# Patient Record
Sex: Male | Born: 1980 | Race: Black or African American | Hispanic: No | Marital: Single | Smoking: Never smoker
Health system: Southern US, Community
[De-identification: ages and names within clinical notes are randomized; demographics above are authoritative.]

## PROBLEM LIST (undated history)

## (undated) DIAGNOSIS — T7840XA Allergy, unspecified, initial encounter: Secondary | ICD-10-CM

## (undated) DIAGNOSIS — F1110 Opioid abuse, uncomplicated: Secondary | ICD-10-CM

## (undated) HISTORY — DX: Allergy, unspecified, initial encounter: T78.40XA

## (undated) HISTORY — PX: SPLENECTOMY: SUR1306

## (undated) HISTORY — PX: NO PAST SURGERIES: SHX2092

---

## 2003-02-20 ENCOUNTER — Ambulatory Visit (HOSPITAL_BASED_OUTPATIENT_CLINIC_OR_DEPARTMENT_OTHER): Admission: RE | Admit: 2003-02-20 | Discharge: 2003-02-20 | Payer: Self-pay | Admitting: Urology

## 2017-06-06 ENCOUNTER — Emergency Department
Admission: EM | Admit: 2017-06-06 | Discharge: 2017-06-06 | Disposition: A | Discharge status: Home / Self Care | Payer: Self-pay | Attending: Emergency Medicine | Admitting: Emergency Medicine

## 2017-06-06 DIAGNOSIS — Y999 Unspecified external cause status: Secondary | ICD-10-CM | POA: Insufficient documentation

## 2017-06-06 DIAGNOSIS — Y9289 Other specified places as the place of occurrence of the external cause: Secondary | ICD-10-CM | POA: Insufficient documentation

## 2017-06-06 DIAGNOSIS — W57XXXA Bitten or stung by nonvenomous insect and other nonvenomous arthropods, initial encounter: Secondary | ICD-10-CM | POA: Insufficient documentation

## 2017-06-06 DIAGNOSIS — Y9389 Activity, other specified: Secondary | ICD-10-CM | POA: Insufficient documentation

## 2017-06-06 DIAGNOSIS — T148XXA Other injury of unspecified body region, initial encounter: Secondary | ICD-10-CM | POA: Insufficient documentation

## 2017-06-06 DIAGNOSIS — R42 Dizziness and giddiness: Secondary | ICD-10-CM | POA: Insufficient documentation

## 2017-06-06 LAB — COMPREHENSIVE METABOLIC PANEL
ALBUMIN: 4.3 g/dL (ref 3.5–5.0)
ALT: 14 U/L — ABNORMAL LOW (ref 17–63)
ANION GAP: 9 (ref 5–15)
AST: 27 U/L (ref 15–41)
Alkaline Phosphatase: 84 U/L (ref 38–126)
BILIRUBIN TOTAL: 0.4 mg/dL (ref 0.3–1.2)
BUN: 14 mg/dL (ref 6–20)
CHLORIDE: 106 mmol/L (ref 101–111)
CO2: 27 mmol/L (ref 22–32)
Calcium: 9.1 mg/dL (ref 8.9–10.3)
Creatinine, Ser: 1.37 mg/dL — ABNORMAL HIGH (ref 0.61–1.24)
Glucose, Bld: 133 mg/dL — ABNORMAL HIGH (ref 65–99)
POTASSIUM: 3.6 mmol/L (ref 3.5–5.1)
SODIUM: 142 mmol/L (ref 135–145)
TOTAL PROTEIN: 7.7 g/dL (ref 6.5–8.1)

## 2017-06-06 LAB — CBC WITH DIFFERENTIAL/PLATELET
BASOS ABS: 0.1 10*3/uL (ref 0–0.1)
BASOS PCT: 1 %
EOS ABS: 0.2 10*3/uL (ref 0–0.7)
Eosinophils Relative: 1 %
HEMATOCRIT: 43.7 % (ref 40.0–52.0)
HEMOGLOBIN: 14.7 g/dL (ref 13.0–18.0)
Lymphocytes Relative: 28 %
Lymphs Abs: 3.7 10*3/uL — ABNORMAL HIGH (ref 1.0–3.6)
MCH: 31.1 pg (ref 26.0–34.0)
MCHC: 33.7 g/dL (ref 32.0–36.0)
MCV: 92.5 fL (ref 80.0–100.0)
Monocytes Absolute: 1 10*3/uL (ref 0.2–1.0)
Monocytes Relative: 8 %
NEUTROS ABS: 8.3 10*3/uL — AB (ref 1.4–6.5)
NEUTROS PCT: 62 %
Platelets: 243 10*3/uL (ref 150–440)
RBC: 4.73 MIL/uL (ref 4.40–5.90)
RDW: 13.1 % (ref 11.5–14.5)
WBC: 13.3 10*3/uL — AB (ref 3.8–10.6)

## 2017-06-06 LAB — LACTIC ACID, PLASMA
LACTIC ACID, VENOUS: 3.3 mmol/L — AB (ref 0.5–1.9)
LACTIC ACID, VENOUS: 1.2 mmol/L (ref 0.5–1.9)

## 2017-06-06 MED ORDER — SODIUM CHLORIDE 0.9 % IV BOLUS (SEPSIS)
1000.0000 mL | Freq: Once | INTRAVENOUS | Status: AC
  Administered 2017-06-06: 1000 mL via INTRAVENOUS

## 2017-06-06 MED ORDER — BACITRACIN ZINC 500 UNIT/GM EX OINT
TOPICAL_OINTMENT | CUTANEOUS | Status: AC
  Filled 2017-06-06: qty 0.9

## 2017-06-06 MED ORDER — BACITRACIN ZINC 500 UNIT/GM EX OINT
TOPICAL_OINTMENT | Freq: Two times a day (BID) | CUTANEOUS | Status: DC
  Administered 2017-06-06: 1 via TOPICAL

## 2017-06-06 MED ORDER — CEPHALEXIN 500 MG PO CAPS: 500.0000 mg | ORAL_CAPSULE | Freq: Two times a day (BID) | ORAL | 0 refills | Status: DC

## 2017-06-06 MED ORDER — BACITRACIN ZINC 500 UNIT/GM EX OINT: TOPICAL_OINTMENT | CUTANEOUS | 0 refills | Status: AC

## 2017-06-06 NOTE — ED Provider Notes (Signed)
[facility] Emergency Department Provider Note  Time seen: 6:19 PM  I have reviewed the triage vital signs and the nursing notes.   HISTORY  Chief Complaint Insect Bite    HPI [name] is a 36 y.o. male with no significant past medical history who presents to the emergency department after a spider bite. According to the patient he was driving down the road when he felt something bite him in his perineum. Patient states he is extremely afraid of spiders and began ferociously scraping the area that the bite was in. Patient states while doing that he did feel a pop of a spider body and did see several spider body parts. Patient states he was having bleeding from this area so he came to the emergency department for evaluation. Patient states initially he was very lightheaded and dizzy after the bite. Admits anxiety about the spider bite. Upon arrival was quite gaping as well as tachycardic. Patient states he is calm down quite a bit now and is feeling much better.  History reviewed. No pertinent past medical history.  There are no active problems to display for this patient.   History reviewed. No pertinent surgical history.  Prior to Admission medications   Not on File    Not on File  History reviewed. No pertinent family history.  Social History Social History  Substance Use Topics  . Smoking status: Never Smoker  . Smokeless tobacco: Never Used  . Alcohol use No    Review of Systems Constitutional: Negative for fever. Positive for lightheadedness now resolved Cardiovascular: Negative for chest pain. Respiratory: Negative for shortness of breath. Gastrointestinal: Negative for abdominal pain. Negative for nausea or vomiting. Negative for diarrhea Musculoskeletal: Negative for back pain. Neurological: Negative for headache All other ROS negative  ____________________________________________   PHYSICAL EXAM:  VITAL SIGNS: ED Triage Vitals   Enc Vitals Group     BP 06/06/17 1730 (!) 150/92     Pulse Rate 06/06/17 1740 (!) 118     Resp --      Temp 06/06/17 1728 99.1 F (37.3 C)     Temp Source 06/06/17 1728 Oral     SpO2 --      Weight 06/06/17 1728 200 lb (90.7 kg)     Height 06/06/17 1728 6\' 1"  (1.854 m)     Head Circumference --      Peak Flow --      Pain Score 06/06/17 1728 10     Pain Loc --      Pain Edu? --      Excl. in GC? --     Constitutional: Alert and oriented. Well appearing and in no distress. Eyes: Normal exam ENT   Head: Normocephalic and atraumatic.   Mouth/Throat: Mucous membranes are moist. Cardiovascular: Regular rate and rhythm in room 100 bpm. No murmur. Respiratory: Normal respiratory effort without tachypnea nor retractions. Breath sounds are clear  Gastrointestinal: Soft and nontender. No distention.  Patient's perineum is quite excoriated there is minimal oozing of blood from one of the scrapes, no lacerations. Musculoskeletal: Nontender with normal range of motion in all extremities.  Neurologic:  Normal speech and language. No gross focal neurologic deficits Skin:  Skin is warm, dry and intact.  Psychiatric: Mood and affect are normal.   ____________________________________________   INITIAL IMPRESSION / ASSESSMENT AND PLAN / ED COURSE  Pertinent labs & imaging results that were available during my care of the patient were reviewed by me and considered  in my medical decision making (see chart for details).  Patient presents to the emergency department after being bit by a spider. Asians pernio Ms. very excoriated consistent with scratching as the patient states he did. There is no laceration but there are several scrapes/abrasions to the area. There is no obvious area of bite. Patient admits significant anxiety at the time I see severely dislikes spiders, but states he is now calm down. His labs have resulted with a significant lactic acidosis of 3.3. It is not entirely clear  with a lactic acidosis is originating. No clinical concerns for sepsis as the patient was just bitten by the spider prior to arrival. We will IV hydrate and recheck labs. We will apply bacitracin to the area.  Patient care signed out to Dr. Derrill KayGoodman.  ____________________________________________   FINAL CLINICAL IMPRESSION(S) / ED DIAGNOSES  Spider bite Excoriation    Minna AntisPaduchowski, Jakaleb Payer, MD 06/06/17 2020

## 2017-06-06 NOTE — ED Triage Notes (Signed)
Per EMS pt was driving and felt a bite between his legs.  He reached down and began to scratch.  Pt states he pulled over and call 911 because he was sweating and felt dizzy and shaking.  Pt is A&Ox4.  Pt denies dizziness upon arrival.  Pt is bleeding from rectal area due to scratching.

## 2017-06-06 NOTE — ED Notes (Signed)
Pt will not leave blood pressure equipment or monitoring equipment in place. Multiple explanations provided to pt regarding need for monitoring equipment placement.

## 2017-06-06 NOTE — Discharge Instructions (Signed)
Please apply bacitracin to the affected area twice daily for the next 7 days. Please take her antibiotic as prescribed twice daily for the next 5 days. Please take Tylenol or ibuprofen as needed for discomfort as written on the box. Return to the emergency department for any significant increase in discomfort, fever, or any other symptom personally concerning to yourself.

## 2017-06-06 NOTE — ED Notes (Signed)
Pt updated on treatment plan and need to redraw lab work at 2030. Pt verbalizes understanding.

## 2017-06-11 LAB — CULTURE, BLOOD (ROUTINE X 2)
CULTURE: NO GROWTH
Culture: NO GROWTH
SPECIAL REQUESTS: ADEQUATE
SPECIAL REQUESTS: ADEQUATE

## 2019-02-04 ENCOUNTER — Other Ambulatory Visit: Payer: Self-pay

## 2019-02-04 ENCOUNTER — Inpatient Hospital Stay
Admission: EM | Admit: 2019-02-04 | Discharge: 2019-02-07 | DRG: 917 | Disposition: A | Discharge status: Home / Self Care | Payer: Self-pay | Attending: Family Medicine | Admitting: Family Medicine

## 2019-02-04 ENCOUNTER — Emergency Department: Payer: Self-pay

## 2019-02-04 DIAGNOSIS — Z9189 Other specified personal risk factors, not elsewhere classified: Secondary | ICD-10-CM | POA: Diagnosis present

## 2019-02-04 DIAGNOSIS — N170 Acute kidney failure with tubular necrosis: Secondary | ICD-10-CM | POA: Diagnosis present

## 2019-02-04 DIAGNOSIS — G92 Toxic encephalopathy: Secondary | ICD-10-CM | POA: Diagnosis present

## 2019-02-04 DIAGNOSIS — T401X1A Poisoning by heroin, accidental (unintentional), initial encounter: Principal | ICD-10-CM | POA: Diagnosis present

## 2019-02-04 DIAGNOSIS — Z7982 Long term (current) use of aspirin: Secondary | ICD-10-CM

## 2019-02-04 DIAGNOSIS — R0602 Shortness of breath: Secondary | ICD-10-CM

## 2019-02-04 DIAGNOSIS — T50901A Poisoning by unspecified drugs, medicaments and biological substances, accidental (unintentional), initial encounter: Secondary | ICD-10-CM

## 2019-02-04 DIAGNOSIS — F4323 Adjustment disorder with mixed anxiety and depressed mood: Secondary | ICD-10-CM | POA: Diagnosis present

## 2019-02-04 DIAGNOSIS — F19239 Other psychoactive substance dependence with withdrawal, unspecified: Secondary | ICD-10-CM | POA: Diagnosis present

## 2019-02-04 DIAGNOSIS — R0902 Hypoxemia: Secondary | ICD-10-CM

## 2019-02-04 DIAGNOSIS — Z1159 Encounter for screening for other viral diseases: Secondary | ICD-10-CM

## 2019-02-04 DIAGNOSIS — J9601 Acute respiratory failure with hypoxia: Secondary | ICD-10-CM | POA: Diagnosis present

## 2019-02-04 DIAGNOSIS — J9602 Acute respiratory failure with hypercapnia: Secondary | ICD-10-CM | POA: Diagnosis present

## 2019-02-04 DIAGNOSIS — J690 Pneumonitis due to inhalation of food and vomit: Secondary | ICD-10-CM | POA: Diagnosis present

## 2019-02-04 DIAGNOSIS — E876 Hypokalemia: Secondary | ICD-10-CM | POA: Diagnosis present

## 2019-02-04 DIAGNOSIS — J9691 Respiratory failure, unspecified with hypoxia: Secondary | ICD-10-CM | POA: Diagnosis present

## 2019-02-04 DIAGNOSIS — Z7902 Long term (current) use of antithrombotics/antiplatelets: Secondary | ICD-10-CM

## 2019-02-04 LAB — CBC WITH DIFFERENTIAL/PLATELET
ABS IMMATURE GRANULOCYTES: 0.09 10*3/uL — AB (ref 0.00–0.07)
BASOS PCT: 0 %
Basophils Absolute: 0.1 10*3/uL (ref 0.0–0.1)
EOS ABS: 0.1 10*3/uL (ref 0.0–0.5)
Eosinophils Relative: 1 %
HCT: 49.6 % (ref 39.0–52.0)
Hemoglobin: 15.9 g/dL (ref 13.0–17.0)
Immature Granulocytes: 1 %
Lymphocytes Relative: 17 %
Lymphs Abs: 3.3 10*3/uL (ref 0.7–4.0)
MCH: 30 pg (ref 26.0–34.0)
MCHC: 32.1 g/dL (ref 30.0–36.0)
MCV: 93.6 fL (ref 80.0–100.0)
MONO ABS: 0.6 10*3/uL (ref 0.1–1.0)
Monocytes Relative: 3 %
NEUTROS ABS: 15.1 10*3/uL — AB (ref 1.7–7.7)
Neutrophils Relative %: 78 %
PLATELETS: 283 10*3/uL (ref 150–400)
RBC: 5.3 MIL/uL (ref 4.22–5.81)
RDW: 13 % (ref 11.5–15.5)
WBC: 19.2 10*3/uL — AB (ref 4.0–10.5)
nRBC: 0 % (ref 0.0–0.2)

## 2019-02-04 LAB — COMPREHENSIVE METABOLIC PANEL
ALT: 14 U/L (ref 0–44)
ANION GAP: 13 (ref 5–15)
AST: 24 U/L (ref 15–41)
Albumin: 4.1 g/dL (ref 3.5–5.0)
Alkaline Phosphatase: 75 U/L (ref 38–126)
BUN: 7 mg/dL (ref 6–20)
CHLORIDE: 100 mmol/L (ref 98–111)
CO2: 22 mmol/L (ref 22–32)
Calcium: 8.2 mg/dL — ABNORMAL LOW (ref 8.9–10.3)
Creatinine, Ser: 1.06 mg/dL (ref 0.61–1.24)
GFR calc Af Amer: >60 mL/min (ref >60)
GFR calc non Af Amer: >60 mL/min (ref >60)
Glucose, Bld: 237 mg/dL — ABNORMAL HIGH (ref 70–99)
POTASSIUM: 3.2 mmol/L — AB (ref 3.5–5.1)
SODIUM: 135 mmol/L (ref 135–145)
Total Bilirubin: 0.6 mg/dL (ref 0.3–1.2)
Total Protein: 7.1 g/dL (ref 6.5–8.1)

## 2019-02-04 LAB — TROPONIN I

## 2019-02-04 MED ORDER — SODIUM CHLORIDE 0.9 % IV SOLN: Freq: Once | INTRAVENOUS | Status: AC

## 2019-02-04 MED ORDER — LORAZEPAM 2 MG/ML IJ SOLN
1.0000 mg | Freq: Once | INTRAMUSCULAR | Status: AC
  Administered 2019-02-04: 1 mg via INTRAVENOUS
  Filled 2019-02-04: qty 1

## 2019-02-04 MED ORDER — IPRATROPIUM-ALBUTEROL 0.5-2.5 (3) MG/3ML IN SOLN
3.0000 mL | Freq: Once | RESPIRATORY_TRACT | Status: AC
  Administered 2019-02-04: 3 mL via RESPIRATORY_TRACT
  Filled 2019-02-04: qty 3

## 2019-02-04 MED ORDER — PIPERACILLIN-TAZOBACTAM 3.375 G IVPB 30 MIN
3.3750 g | Freq: Once | INTRAVENOUS | Status: AC
  Administered 2019-02-04: 3.375 g via INTRAVENOUS
  Filled 2019-02-04: qty 50

## 2019-02-04 NOTE — ED Provider Notes (Signed)
[facility] Emergency Department Provider Note       Time seen: ----------------------------------------- 10:35 PM on 02/04/2019 -----------------------------------------  Level V caveat: History/ROS limited by altered mental status I have reviewed the triage vital signs and the nursing notes.  HISTORY   Chief Complaint Drug Overdose    HPI [name] is a 38 y.o. male with a history of no known past medical history who was brought to the ER after overdose.  Patient had just finished rehab for narcotics about a week ago, family called EMS because he was unresponsive.  When EMS arrived they initially thought he was a cardiac arrest.  IO access was obtained and he was given 4 mg of Narcan intranasally with return of mental status.  Patient was noted to be hypoxic in route and was placed on a nonrebreather.  Patient states he does not remember what happened.  No past medical history on file.  There are no active problems to display for this patient.   No past surgical history on file.  Allergies Patient has no allergy information on record.  Social History Social History   Tobacco Use  . Smoking status: Never Smoker  . Smokeless tobacco: Never Used  Substance Use Topics  . Alcohol use: No  . Drug use: No   Review of Systems Constitutional: Negative for fever. Cardiovascular: Negative for chest pain. Respiratory: Positive for shortness of breath Gastrointestinal: Negative for abdominal pain, vomiting and diarrhea. Musculoskeletal: Negative for back pain. Skin: Negative for rash. Neurological: Negative for headaches, focal weakness or numbness.  All systems negative/normal/unremarkable except as stated in the HPI  ____________________________________________   PHYSICAL EXAM:  VITAL SIGNS: ED Triage Vitals  Enc Vitals Group     BP      Pulse      Resp      Temp      Temp src      SpO2      Weight      Height      Head  Circumference      Peak Flow      Pain Score      Pain Loc      Pain Edu?      Excl. in GC?    Constitutional: Alert but disoriented, mild distress Eyes: Conjunctivae are normal. Normal extraocular movements. ENT      Head: Normocephalic and atraumatic.      Nose: No congestion/rhinnorhea.      Mouth/Throat: Mucous membranes are moist.      Neck: No stridor. Cardiovascular: Normal rate, regular rhythm. No murmurs, rubs, or gallops. Respiratory: Tachypnea with rales bilaterally Gastrointestinal: Soft and nontender. Normal bowel sounds Musculoskeletal: Nontender with normal range of motion in extremities.  IO access in the left tibia Neurologic:  Normal speech and language. No gross focal neurologic deficits are appreciated.  Skin:  Skin is warm, dry and intact. No rash noted. Psychiatric: There is some confusion, affect appears to be normal ____________________________________________  EKG: Interpreted by me.  Sinus tachycardia with a rate of 150 bpm, normal PR interval, normal QRS, normal QT  ____________________________________________  ED COURSE:  As part of my medical decision making, I reviewed the following data within the electronic MEDICAL RECORD NUMBER History obtained from family if available, nursing notes, old chart and ekg, as well as notes from prior ED visits. Patient presented for an overdose with possible aspiration, we will assess with labs and imaging as indicated at this time.   Procedures  [name] was evaluated in Emergency Department on 02/04/2019 for the symptoms described in the history of present illness. He was evaluated in the context of the global COVID-19 pandemic, which necessitated consideration that the patient might be at risk for infection with the SARS-CoV-2 virus that causes COVID-19. Institutional protocols and algorithms that pertain to the evaluation of patients at risk for COVID-19 are in a state of rapid change based on information released by  regulatory bodies including the CDC and federal and state organizations. These policies and algorithms were followed during the patient's care in the ED.  ____________________________________________   LABS (pertinent positives/negatives)  Labs Reviewed  CBC WITH DIFFERENTIAL/PLATELET - Abnormal; Notable for the following components:      Result Value   WBC 19.2 (*)    Neutro Abs 15.1 (*)    Abs Immature Granulocytes 0.09 (*)    All other components within normal limits  COMPREHENSIVE METABOLIC PANEL - Abnormal; Notable for the following components:   Potassium 3.2 (*)    Glucose, Bld 237 (*)    Calcium 8.2 (*)    All other components within normal limits  TROPONIN I  URINE DRUG SCREEN, QUALITATIVE ([facility] ONLY)  BLOOD GAS, VENOUS  ETHANOL  ACETAMINOPHEN LEVEL  SALICYLATE LEVEL   CRITICAL CARE Performed by: Ulice Dash   Total critical care time: 30 minutes  Critical care time was exclusive of separately billable procedures and treating other patients.  Critical care was necessary to treat or prevent imminent or life-threatening deterioration.  Critical care was time spent personally by me on the following activities: development of treatment plan with patient and/or surrogate as well as nursing, discussions with consultants, evaluation of patient's response to treatment, examination of patient, obtaining history from patient or surrogate, ordering and performing treatments and interventions, ordering and review of laboratory studies, ordering and review of radiographic studies, pulse oximetry and re-evaluation of patient's condition.  RADIOLOGY Images were viewed by me  chest x-ray Revealed bibasilar infiltrates ____________________________________________   DIFFERENTIAL DIAGNOSIS   Overdose, aspiration pneumonia, substance abuse, intoxication  FINAL ASSESSMENT AND PLAN  Overdose, hypoxemia, likely aspiration pneumonia   Plan: The patient had presented for  accidental drug overdose. Patient's labs are still pending at this time. Patient's imaging revealed right lower lobe infiltrates, possible aspiration pneumonia.  Patient appears to be improving on BiPAP considerably, oxygen saturations are improving and heart rate is decreasing.  He was given IV Zosyn to cover for aspiration pneumonia.  I will discuss with the hospitalist for admission.   Ulice Dash, MD    Note: This note was generated in part or whole with voice recognition software. Voice recognition is usually quite accurate but there are transcription errors that can and very often do occur. I apologize for any typographical errors that were not detected and corrected.     Emily Filbert, MD 02/04/19 [phone]

## 2019-02-04 NOTE — ED Triage Notes (Signed)
Pt to ED via EMS from home with overdose on fentanyl, pt states he does not know maount and he injected it. Per ems pt was initially unresponsive with no pulses. Pt was given 4mg  narcan intranasally, pt had pinpoint pupils and agonal respirations. Placed on non rebreather 15l. Pt arrives alert denies pain. 85% room air.

## 2019-02-04 NOTE — ED Notes (Signed)
Redrawn red top, Ann RN aware

## 2019-02-05 DIAGNOSIS — T50901A Poisoning by unspecified drugs, medicaments and biological substances, accidental (unintentional), initial encounter: Secondary | ICD-10-CM | POA: Diagnosis present

## 2019-02-05 DIAGNOSIS — J690 Pneumonitis due to inhalation of food and vomit: Secondary | ICD-10-CM | POA: Diagnosis present

## 2019-02-05 DIAGNOSIS — Z9189 Other specified personal risk factors, not elsewhere classified: Secondary | ICD-10-CM | POA: Diagnosis present

## 2019-02-05 DIAGNOSIS — J9601 Acute respiratory failure with hypoxia: Secondary | ICD-10-CM

## 2019-02-05 DIAGNOSIS — J9691 Respiratory failure, unspecified with hypoxia: Secondary | ICD-10-CM | POA: Diagnosis present

## 2019-02-05 LAB — PHOSPHORUS
Phosphorus: 1 mg/dL — CL (ref 2.5–4.6)
Phosphorus: 2.1 mg/dL — ABNORMAL LOW (ref 2.5–4.6)

## 2019-02-05 LAB — SALICYLATE LEVEL: Salicylate Lvl: <7 mg/dL (ref 2.8–30.0)

## 2019-02-05 LAB — RESPIRATORY PANEL BY PCR

## 2019-02-05 LAB — BASIC METABOLIC PANEL
Anion gap: 9 (ref 5–15)
Anion gap: 8 (ref 5–15)
BUN: 6 mg/dL (ref 6–20)
BUN: 8 mg/dL (ref 6–20)
CO2: 24 mmol/L (ref 22–32)
CO2: 23 mmol/L (ref 22–32)
Calcium: 8.6 mg/dL — ABNORMAL LOW (ref 8.9–10.3)
Calcium: 8 mg/dL — ABNORMAL LOW (ref 8.9–10.3)
Chloride: 103 mmol/L (ref 98–111)
Chloride: 105 mmol/L (ref 98–111)
Creatinine, Ser: 0.95 mg/dL (ref 0.61–1.24)
Creatinine, Ser: 0.97 mg/dL (ref 0.61–1.24)
GFR calc Af Amer: >60 mL/min (ref >60)
GFR calc Af Amer: >60 mL/min (ref >60)
GFR calc non Af Amer: >60 mL/min (ref >60)
GFR calc non Af Amer: >60 mL/min (ref >60)
GLUCOSE: 116 mg/dL — AB (ref 70–99)
Glucose, Bld: 173 mg/dL — ABNORMAL HIGH (ref 70–99)
POTASSIUM: 4.3 mmol/L (ref 3.5–5.1)
Potassium: 5.1 mmol/L (ref 3.5–5.1)
Sodium: 136 mmol/L (ref 135–145)
Sodium: 136 mmol/L (ref 135–145)

## 2019-02-05 LAB — CBC
HCT: 48.7 % (ref 39.0–52.0)
Hemoglobin: 15.9 g/dL (ref 13.0–17.0)
MCH: 30.2 pg (ref 26.0–34.0)
MCHC: 32.6 g/dL (ref 30.0–36.0)
MCV: 92.4 fL (ref 80.0–100.0)
Platelets: 283 10*3/uL (ref 150–400)
RBC: 5.27 MIL/uL (ref 4.22–5.81)
RDW: 13 % (ref 11.5–15.5)
WBC: 17.3 10*3/uL — ABNORMAL HIGH (ref 4.0–10.5)
nRBC: 0 % (ref 0.0–0.2)

## 2019-02-05 LAB — INFLUENZA PANEL BY PCR (TYPE A & B)
INFLAPCR: NEGATIVE
Influenza B By PCR: NEGATIVE

## 2019-02-05 LAB — URINE DRUG SCREEN, QUALITATIVE (ARMC ONLY)
Amphetamines, Ur Screen: NOT DETECTED
BARBITURATES, UR SCREEN: NOT DETECTED
Benzodiazepine, Ur Scrn: NOT DETECTED
COCAINE METABOLITE, UR ~~LOC~~: NOT DETECTED
Cannabinoid 50 Ng, Ur ~~LOC~~: NOT DETECTED
MDMA (Ecstasy)Ur Screen: NOT DETECTED
METHADONE SCREEN, URINE: NOT DETECTED
OPIATE, UR SCREEN: POSITIVE — AB
PHENCYCLIDINE (PCP) UR S: NOT DETECTED
Tricyclic, Ur Screen: NOT DETECTED

## 2019-02-05 LAB — GLUCOSE, CAPILLARY: GLUCOSE-CAPILLARY: 120 mg/dL — AB (ref 70–99)

## 2019-02-05 LAB — MRSA PCR SCREENING: MRSA by PCR: NEGATIVE

## 2019-02-05 LAB — BLOOD GAS, VENOUS
Acid-Base Excess: 4.3 mmol/L — ABNORMAL HIGH (ref 0.0–2.0)
Bicarbonate: 33.4 mmol/L — ABNORMAL HIGH (ref 20.0–28.0)
O2 Saturation: 74.3 %
PCO2 VEN: 71 mmHg — AB (ref 44.0–60.0)
PH VEN: 7.28 (ref 7.250–7.430)
Patient temperature: 37
pO2, Ven: 45 mmHg (ref 32.0–45.0)

## 2019-02-05 LAB — HEMOGLOBIN A1C
Hgb A1c MFr Bld: 5.9 % — ABNORMAL HIGH (ref 4.8–5.6)
Mean Plasma Glucose: 122.63 mg/dL

## 2019-02-05 LAB — TROPONIN I

## 2019-02-05 LAB — ACETAMINOPHEN LEVEL

## 2019-02-05 LAB — MAGNESIUM: Magnesium: 1.5 mg/dL — ABNORMAL LOW (ref 1.7–2.4)

## 2019-02-05 LAB — ETHANOL: Alcohol, Ethyl (B): <10 mg/dL (ref <10)

## 2019-02-05 MED ORDER — POTASSIUM CHLORIDE 20 MEQ PO PACK
60.0000 meq | PACK | Freq: Once | ORAL | Status: AC
  Administered 2019-02-05: 60 meq via ORAL
  Filled 2019-02-05: qty 3

## 2019-02-05 MED ORDER — METOPROLOL TARTRATE 5 MG/5ML IV SOLN
5.0000 mg | INTRAVENOUS | Status: AC
  Administered 2019-02-05: 5 mg via INTRAVENOUS

## 2019-02-05 MED ORDER — IPRATROPIUM-ALBUTEROL 20-100 MCG/ACT IN AERS
1.0000 | INHALATION_SPRAY | Freq: Four times a day (QID) | RESPIRATORY_TRACT | Status: DC
  Administered 2019-02-05: 1 via RESPIRATORY_TRACT
  Filled 2019-02-05: qty 4

## 2019-02-05 MED ORDER — ACETAMINOPHEN 325 MG PO TABS
650.0000 mg | ORAL_TABLET | ORAL | Status: DC | PRN
  Administered 2019-02-05 (×2): 650 mg via ORAL
  Filled 2019-02-05 (×2): qty 2

## 2019-02-05 MED ORDER — LABETALOL HCL 5 MG/ML IV SOLN
10.0000 mg | INTRAVENOUS | Status: AC
  Administered 2019-02-05: 10 mg via INTRAVENOUS
  Filled 2019-02-05: qty 4

## 2019-02-05 MED ORDER — DEXTROSE-NACL 5-0.9 % IV SOLN
INTRAVENOUS | Status: DC
  Administered 2019-02-05: 02:00:00 via INTRAVENOUS

## 2019-02-05 MED ORDER — PIPERACILLIN-TAZOBACTAM 3.375 G IVPB: 3.3750 g | Freq: Three times a day (TID) | INTRAVENOUS | Status: DC

## 2019-02-05 MED ORDER — SODIUM PHOSPHATES 45 MMOLE/15ML IV SOLN
20.0000 mmol | Freq: Once | INTRAVENOUS | Status: AC
  Administered 2019-02-06: 20 mmol via INTRAVENOUS
  Filled 2019-02-05: qty 6.67

## 2019-02-05 MED ORDER — ENOXAPARIN SODIUM 40 MG/0.4ML ~~LOC~~ SOLN
40.0000 mg | SUBCUTANEOUS | Status: DC
  Administered 2019-02-05: 40 mg via SUBCUTANEOUS
  Filled 2019-02-05: qty 0.4

## 2019-02-05 MED ORDER — SODIUM CHLORIDE 0.9 % IV SOLN
3.0000 g | Freq: Four times a day (QID) | INTRAVENOUS | Status: DC
  Administered 2019-02-05 – 2019-02-07 (×6): 3 g via INTRAVENOUS
  Filled 2019-02-05 (×9): qty 3

## 2019-02-05 MED ORDER — MAGNESIUM SULFATE 4 GM/100ML IV SOLN
4.0000 g | Freq: Once | INTRAVENOUS | Status: AC
  Administered 2019-02-05: 4 g via INTRAVENOUS
  Filled 2019-02-05: qty 100

## 2019-02-05 MED ORDER — ALBUTEROL SULFATE HFA 108 (90 BASE) MCG/ACT IN AERS
1.0000 | INHALATION_SPRAY | RESPIRATORY_TRACT | Status: DC | PRN
  Filled 2019-02-05: qty 6.7

## 2019-02-05 MED ORDER — SODIUM CHLORIDE 0.9 % IV BOLUS
1000.0000 mL | Freq: Once | INTRAVENOUS | Status: AC
  Administered 2019-02-05: 1000 mL via INTRAVENOUS

## 2019-02-05 MED ORDER — K PHOS MONO-SOD PHOS DI & MONO 155-852-130 MG PO TABS
500.0000 mg | ORAL_TABLET | ORAL | Status: AC
  Administered 2019-02-05 (×2): 500 mg via ORAL
  Filled 2019-02-05 (×3): qty 2

## 2019-02-05 MED ORDER — METOPROLOL TARTRATE 5 MG/5ML IV SOLN
INTRAVENOUS | Status: AC
  Administered 2019-02-05: 5 mg via INTRAVENOUS
  Filled 2019-02-05: qty 5

## 2019-02-05 MED ORDER — QUETIAPINE FUMARATE 200 MG PO TABS
300.0000 mg | ORAL_TABLET | Freq: Every day | ORAL | Status: DC
  Administered 2019-02-05: 300 mg via ORAL
  Filled 2019-02-05: qty 1

## 2019-02-05 MED ORDER — PIPERACILLIN-TAZOBACTAM 3.375 G IVPB
3.3750 g | Freq: Three times a day (TID) | INTRAVENOUS | Status: DC
  Administered 2019-02-05: 3.375 g via INTRAVENOUS
  Filled 2019-02-05: qty 50

## 2019-02-05 MED ORDER — IPRATROPIUM-ALBUTEROL 0.5-2.5 (3) MG/3ML IN SOLN: 3.0000 mL | Freq: Four times a day (QID) | RESPIRATORY_TRACT | Status: DC

## 2019-02-05 MED ORDER — IPRATROPIUM-ALBUTEROL 20-100 MCG/ACT IN AERS
1.0000 | INHALATION_SPRAY | Freq: Four times a day (QID) | RESPIRATORY_TRACT | Status: DC | PRN
  Filled 2019-02-05: qty 4

## 2019-02-05 MED ORDER — IPRATROPIUM-ALBUTEROL 0.5-2.5 (3) MG/3ML IN SOLN
RESPIRATORY_TRACT | Status: AC
  Administered 2019-02-05: 3 mL
  Filled 2019-02-05: qty 3

## 2019-02-05 MED ORDER — SODIUM CHLORIDE 0.9 % IV SOLN
INTRAVENOUS | Status: DC | PRN
  Administered 2019-02-05: 250 mL via INTRAVENOUS

## 2019-02-05 MED ORDER — ONDANSETRON HCL 4 MG/2ML IJ SOLN: 4.0000 mg | Freq: Four times a day (QID) | INTRAMUSCULAR | Status: DC | PRN

## 2019-02-05 MED ORDER — ALBUTEROL SULFATE (2.5 MG/3ML) 0.083% IN NEBU: 2.5000 mg | INHALATION_SOLUTION | RESPIRATORY_TRACT | Status: DC | PRN

## 2019-02-05 MED ORDER — ENOXAPARIN SODIUM 40 MG/0.4ML ~~LOC~~ SOLN
40.0000 mg | SUBCUTANEOUS | Status: DC
  Administered 2019-02-06 – 2019-02-07 (×2): 40 mg via SUBCUTANEOUS
  Filled 2019-02-05 (×2): qty 0.4

## 2019-02-05 MED ORDER — SODIUM PHOSPHATES 45 MMOLE/15ML IV SOLN
30.0000 mmol | Freq: Once | INTRAVENOUS | Status: AC
  Administered 2019-02-05: 30 mmol via INTRAVENOUS
  Filled 2019-02-05: qty 10

## 2019-02-05 NOTE — ED Notes (Signed)
Redrawn red top, Ann RN aware 

## 2019-02-05 NOTE — Progress Notes (Signed)
Patient transported to ICU room on Bipap with no complications. Report given to ICU RT.

## 2019-02-05 NOTE — Progress Notes (Signed)
Pharmacy Electrolyte Monitoring Consult:  Pharmacy consulted to assist in monitoring and replacing electrolytes in this 38 y.o. male admitted on 02/04/2019 with Drug Overdose   Labs:  Sodium (mmol/L)  Date Value  02/05/2019 136   Potassium (mmol/L)  Date Value  02/05/2019 4.3   Magnesium (mg/dL)  Date Value  26/83/4196 1.5 (L)   Phosphorus (mg/dL)  Date Value  22/29/7989 2.1 (L)   Calcium (mg/dL)  Date Value  21/19/4174 8.0 (L)   Albumin (g/dL)  Date Value  06/22/4817 4.1    Assessment/Plan:  Will dose Sodium Phosphate Neutral tablets 2po x 2 per protocol and check all electrolytes with am labs.   Will replace for goal potassium ~4, goal magnesium ~ 2, and goal phosphorus > 2.5.   Pharmacy will continue to monitor and adjust per consult.   Albina Billet, PharmD, BCPS Clinical Pharmacist 02/05/2019 8:40 PM

## 2019-02-05 NOTE — ED Notes (Signed)
ED TO INPATIENT HANDOFF REPORT  ED Nurse Name and Phone #: Dewayne Hatch [phone]  S Name/Age/Gender [name] 38 y.o. male Room/Bed: ED24A/ED24A  Code Status   Code Status: Not on file  Home/SNF/Other Home Patient oriented to: self, place, time and situation Is this baseline? Yes   Triage Complete: Triage complete  Chief Complaint Overdose  Triage Note Pt to ED via EMS from home with overdose on fentanyl, pt states he does not know maount and he injected it. Per ems pt was initially unresponsive with no pulses. Pt was given 4mg  narcan intranasally, pt had pinpoint pupils and agonal respirations. Placed on non rebreather 15l. Pt arrives alert denies pain. 85% room air.   Allergies Not on File  Level of Care/Admitting Diagnosis ED Disposition    ED Disposition Condition Comment   Admit  Hospital Area: [facility] [[id number]]  Level of Care: ICU [6]  Diagnosis: Respiratory failure with hypoxia [facility]) [[id number]]  Admitting Physician: Will Bonnet [[id number]]  Attending Physician: Will Bonnet [[id number]]  Estimated length of stay: past midnight tomorrow  Certification:: I certify this patient will need inpatient services for at least 2 midnights  PT Class (Do Not Modify): Inpatient [101]  PT Acc Code (Do Not Modify): Private [1]       B Medical/Surgery History History reviewed. No pertinent past medical history. History reviewed. No pertinent surgical history.   A IV Location/Drains/Wounds Patient Lines/Drains/Airways Status   Active Line/Drains/Airways    Name:   Placement date:   Placement time:   Site:   Days:   Peripheral IV 02/04/19 Right Hand   02/04/19    2259    Hand   1   Peripheral IV 02/04/19 Left Antecubital   02/04/19    2354    Antecubital   1          Intake/Output Last 24 hours  Intake/Output Summary (Last 24 hours) at 02/05/2019 0045 Last data filed at 02/05/2019 0020 Gross per 24 hour  Intake 42.34 ml  Output -  Net 42.34 ml     Labs/Imaging Results for orders placed or performed during the hospital encounter of 02/04/19 (from the past 48 hour(s))  CBC with Differential/Platelet     Status: Abnormal   Collection Time: 02/04/19 10:55 PM  Result Value Ref Range   WBC 19.2 (H) 4.0 - 10.5 K/uL   RBC 5.30 4.22 - 5.81 MIL/uL   Hemoglobin 15.9 13.0 - 17.0 g/dL   HCT 08.6 57.8 - 46.9 %   MCV 93.6 80.0 - 100.0 fL   MCH 30.0 26.0 - 34.0 pg   MCHC 32.1 30.0 - 36.0 g/dL   RDW 62.9 52.8 - 41.3 %   Platelets 283 150 - 400 K/uL   nRBC 0.0 0.0 - 0.2 %   Neutrophils Relative % 78 %   Neutro Abs 15.1 (H) 1.7 - 7.7 K/uL   Lymphocytes Relative 17 %   Lymphs Abs 3.3 0.7 - 4.0 K/uL   Monocytes Relative 3 %   Monocytes Absolute 0.6 0.1 - 1.0 K/uL   Eosinophils Relative 1 %   Eosinophils Absolute 0.1 0.0 - 0.5 K/uL   Basophils Relative 0 %   Basophils Absolute 0.1 0.0 - 0.1 K/uL   Immature Granulocytes 1 %   Abs Immature Granulocytes 0.09 (H) 0.00 - 0.07 K/uL    Comment: Performed at [facility], [address], [place], [place] [address]  Comprehensive metabolic panel     Status: Abnormal  Collection Time: 02/04/19 10:55 PM  Result Value Ref Range   Sodium 135 135 - 145 mmol/L   Potassium 3.2 (L) 3.5 - 5.1 mmol/L   Chloride 100 98 - 111 mmol/L   CO2 22 22 - 32 mmol/L   Glucose, Bld 237 (H) 70 - 99 mg/dL   BUN 7 6 - 20 mg/dL   Creatinine, Ser 7.41 0.61 - 1.24 mg/dL   Calcium 8.2 (L) 8.9 - 10.3 mg/dL   Total Protein 7.1 6.5 - 8.1 g/dL   Albumin 4.1 3.5 - 5.0 g/dL   AST 24 15 - 41 U/L   ALT 14 0 - 44 U/L   Alkaline Phosphatase 75 38 - 126 U/L   Total Bilirubin 0.6 0.3 - 1.2 mg/dL   GFR calc non Af Amer >60 >60 mL/min   GFR calc Af Amer >60 >60 mL/min   Anion gap 13 5 - 15    Comment: Performed at [facility], [address] [place], [place] [address]  Troponin I - ONCE - STAT     Status: None   Collection Time: 02/04/19 10:55 PM  Result Value Ref Range   Troponin I <0.03 <0.03 ng/mL     Comment: Performed at [facility], [address] [place], [place] [address]  Blood gas, venous     Status: Abnormal   Collection Time: 02/04/19 11:39 PM  Result Value Ref Range   pH, Ven 7.28 7.250 - 7.430   pCO2, Ven 71 (HH) 44.0 - 60.0 mmHg    Comment: CRITICAL RESULT CALLED TO, READ BACK BY AND VERIFIED WITH: DAWN CHARGE RN  [id number] DT  DR FORBACH IN WITH CRITICAL PT NOW IN CHARGE OF THIS PT.     pO2, Ven 45.0 32.0 - 45.0 mmHg   Bicarbonate 33.4 (H) 20.0 - 28.0 mmol/L   Acid-Base Excess 4.3 (H) 0.0 - 2.0 mmol/L   O2 Saturation 74.3 %   Patient temperature 37.0    Collection site LINE    Sample type VENOUS     Comment: Performed at [facility], [address], [place], [place] [address]   Dg Chest 1 View  Result Date: 02/04/2019 CLINICAL DATA:  Dyspnea and hypoxia overdose. EXAM: CHEST  1 VIEW COMPARISON:  None. FINDINGS: Normal heart size and mediastinal contours. Heterogeneous bilateral opacities in the central mid lower lung zone distribution. No pleural effusion or pneumothorax. No acute osseous abnormalities are seen. IMPRESSION: Heterogeneous central lower lung zone opacities may be pulmonary edema, pneumonia, or aspiration. Electronically Signed   By: Narda Rutherford M.D.   On: 02/04/2019 23:43    Pending Labs Unresulted Labs (From admission, onward)    Start     Ordered   02/05/19 0030  Ethanol  Once,   STAT     02/05/19 0030   02/05/19 0030  Acetaminophen level  Once,   STAT     02/05/19 0030   02/05/19 0030  Salicylate level  Once,   STAT     02/05/19 0030   02/04/19 2235  Urine Drug Screen, Qualitative ([facility] only)  Once,   STAT     02/04/19 2235   Signed and Held  HIV antibody (Routine Testing)  Once,   R     Signed and Held   Signed and Held  CBC  Tomorrow morning,   R     Signed and Held   Signed and Held  Basic metabolic panel  Tomorrow morning,   R  Signed and Held   Signed and Held  Blood gas, arterial  Tomorrow morning,    R     Signed and Held          Vitals/Pain Today's Vitals   02/04/19 2330 02/05/19 0000 02/05/19 0015 02/05/19 0030  BP: (!) 156/110 (!) 140/99 (!) 141/89 (!) 149/85  Pulse: (!) 147 (!) 155 (!) 149 (!) 147  Resp: 18 (!) 24 (!) 24 (!) 25  SpO2: 94% 97% 96% 96%  Weight:      Height:      PainSc:        Isolation Precautions No active isolations  Medications Medications  piperacillin-tazobactam (ZOSYN) IVPB 3.375 g (has no administration in time range)  ipratropium-albuterol (DUONEB) 0.5-2.5 (3) MG/3ML nebulizer solution 3 mL (3 mLs Nebulization Given 02/04/19 2254)  0.9 %  sodium chloride infusion ( Intravenous New Bag/Given 02/05/19 0003)  piperacillin-tazobactam (ZOSYN) IVPB 3.375 g (0 g Intravenous Stopped 02/05/19 0020)  LORazepam (ATIVAN) injection 1 mg (1 mg Intravenous Given 02/04/19 2347)    Mobility walks High fall risk   Focused Assessments Pulmonary Assessment Handoff:  Lung sounds: Bilateral Breath Sounds: Diminished O2 Device: Bi-PAP        R Recommendations: See Admitting Provider Note  Report given to:   Additional Notes: Fentanyl OD, DC'd from rehab facility approximately 8 days ago. Rehab from opiate use.

## 2019-02-05 NOTE — Progress Notes (Signed)
NP notified of elevated HR 140-150's and elevated temperature 101.1 axillary on arrival to ICU bed 2. Awaiting new orders.

## 2019-02-05 NOTE — Progress Notes (Signed)
Pharmacy Electrolyte Monitoring Consult:  Pharmacy consulted to assist in monitoring and replacing electrolytes in this 38 y.o. male admitted on 02/04/2019 with Drug Overdose   Labs:  Sodium (mmol/[name])  Date Value  02/05/2019 136   Potassium (mmol/[name])  Date Value  02/05/2019 5.1   Magnesium (mg/dL)  Date Value  46/50/3546 1.5 ([name])   Phosphorus (mg/dL)  Date Value  56/81/2751 1.0 (LL)   Calcium (mg/dL)  Date Value  70/11/7492 8.6 ([name])   Albumin (g/dL)  Date Value  49/67/5916 4.1    Assessment/Plan: Patient ordered sodium phosphate IV x 1 and magnesium 4g IV x 1.   Will recheck bmp/phos at 1800 and all electrolytes with am labs.   Will replace for goal potassium ~4, goal magnesium ~ 2, and goal phosphorus > 2.5.   Pharmacy will continue to monitor and adjust per consult.   [name] 02/05/2019 4:29 PM

## 2019-02-05 NOTE — ED Notes (Signed)
Ann RN, aware of bed assigned 

## 2019-02-05 NOTE — Consult Note (Signed)
PULMONARY / CRITICAL CARE MEDICINE  Name: [name] MRN: [id number] DOB: 03-04-1981    LOS: 0  Referring Provider:  Dr. Will Bonnet Reason for Referral: Acute hypoxic respiratory failure  HPI: This is a 38 year old African-American male with a history of polysubstance abuse, recently discharged from inpatient rehabilitation for opiate abuse, who was brought to the emergency room after being found unresponsive by family.  Patient had injected an unknown amount of fentanyl.  He was given nasal Narcan by EMS with improvement in mental status and respirations.  Per EMS records, patient vomited once in the field.  He was placed on a nonrebreather mask and transferred to the ED.  His SPO2 was 85% at the time.  His ED work-up was significant for bilateral lung opacities in the lower lobes, WBC of 19.2K, and his venous blood gas showed a pH of 7.28, PCO2 of 71 and a bicarb level of 33.  He was placed on BiPAP, started on broad-spectrum antibiotics and admitted to the ICU. Upon arrival in the ICU, patient was febrile with a temperature of 101.1, tachycardic with heart rate in the 150s, and his repeat labs showed a phosphorus level of 1.0 and magnesium of 1.5.  He denies chest pain, palpitations, dizziness and headache but reports a productive cough and a generalized feeling of malaise.  History reviewed. No pertinent past medical history. History reviewed. No pertinent surgical history. Prior to Admission medications   Medication Sig Start Date End Date Taking? Authorizing Provider  amLODipine (NORVASC) 5 MG tablet Take 5 mg by mouth daily.   Yes [provider]  clopidogrel (PLAVIX) 75 MG tablet Take 75 mg by mouth daily.   Yes [provider]  donepezil (ARICEPT) 5 MG tablet Take 1 tablet (5 mg total) by mouth at bedtime. 07/04/18 08/13/18 Yes Sowles, Danna Hefty, MD  empagliflozin (JARDIANCE) 25 MG TABS tablet Take 25 mg by mouth daily.   Yes [provider]  glycopyrrolate  (ROBINUL) 1 MG tablet Take 1 mg by mouth 2 (two) times daily.   Yes [provider]  insulin aspart (NOVOLOG FLEXPEN) 100 UNIT/ML FlexPen Inject 12 Units into the skin 2 (two) times daily.   Yes [provider]  insulin aspart (NOVOLOG) 100 UNIT/ML FlexPen Inject 18 Units into the skin daily. At 1700   Yes [provider]  Insulin Degludec-Liraglutide (XULTOPHY) 100-3.6 UNIT-MG/ML SOPN Inject 50 Units into the skin daily.   Yes [provider]  levETIRAcetam (KEPPRA) 500 MG tablet Take 500 mg by mouth 2 (two) times daily.   Yes [provider]  lipase/protease/amylase (CREON) 12000 units CPEP capsule Take 6,000 Units by mouth 3 (three) times daily before meals.   Yes [provider]  lipase/protease/amylase (CREON) 12000 units CPEP capsule Take 3,000 Units by mouth at bedtime. With snack   Yes [provider]  lisinopril (PRINIVIL,ZESTRIL) 5 MG tablet Take 5 mg by mouth daily.   Yes [provider]  metoprolol succinate (TOPROL-XL) 25 MG 24 hr tablet Take 1 tablet (25 mg total) by mouth daily. 07/04/18  Yes Sowles, Danna Hefty, MD  rosuvastatin (CRESTOR) 40 MG tablet Take 1 tablet (40 mg total) by mouth daily. 07/04/18 08/13/18 Yes Alba Cory, MD  aspirin EC 81 MG tablet Take 81 mg by mouth daily.    [provider]  famotidine (PEPCID) 20 MG tablet Take 1 tablet (20 mg total) by mouth 2 (two) times daily. 07/04/18 08/03/18  Alba Cory, MD  gabapentin (NEURONTIN) 300 MG capsule Take  1 capsule (300 mg total) by mouth 2 (two) times daily. 07/04/18 08/03/18  Alba Cory, MD  insulin glargine (LANTUS) 100 UNIT/ML injection Inject 0.1 mLs (10 Units total) into the skin daily. 07/04/18 08/03/18  Alba Cory, MD  lacosamide 100 MG TABS Take 1 tablet (100 mg total) by mouth 2 (two) times daily. Patient not taking: Reported on 08/13/2018 11/19/17   Enedina Finner, MD  promethazine (PHENERGAN) 12.5 MG tablet Take 1 tablet (12.5 mg  total) by mouth every 6 (six) hours as needed for nausea or vomiting. Patient not taking: Reported on 08/13/2018 09/07/17   Almond Lint, MD  sertraline (ZOLOFT) 25 MG tablet Take 1 tablet (25 mg total) by mouth daily. Patient not taking: Reported on 08/13/2018 07/04/18   Alba Cory, MD   Allergies Not on File  Family History No family history on file. Social History  reports that he has never smoked. He has never used smokeless tobacco. He reports that he does not drink alcohol or use drugs.  Review Of Systems:   Constitutional: Positive for fever and chills HENT: Negative for congestion and rhinorrhea.  Eyes: Negative for redness and visual disturbance.  Respiratory: Negative for shortness of breath and wheezing but positive for productive cough Cardiovascular: Negative for chest pain and palpitations.  Gastrointestinal: Negative  for nausea , vomiting and abdominal pain and  Loose stools Genitourinary: Negative for dysuria and urgency.  Endocrine: Denies polyuria, polyphagia and heat intolerance Musculoskeletal: Negative for myalgias and arthralgias.  Skin: Negative for pallor and wound.  Neurological: Negative for dizziness and headaches   VITAL SIGNS: BP (!) 132/93   Pulse (!) 144   Temp (!) 101.1 F (38.4 C) (Axillary)   Resp 19   Ht 6' (1.829 m)   Wt 113.4 kg   SpO2 95%   BMI 33.91 kg/m   HEMODYNAMICS:    VENTILATOR SETTINGS:    INTAKE / OUTPUT: No intake/output data recorded.  PHYSICAL EXAMINATION: General: Well-nourished, well-developed, in no acute distress HEENT: PERRLA, trachea midline, no JVD Neuro: Alert and oriented x3, no focal deficits Cardiovascular: Apical pulse tachycardic, S1-S2, no murmur regurg or gallop, +2 pulses bilaterally Lungs: Bilateral breath sounds without wheezing, diffuse coarse rhonchi in anterior lung fields, bibasilar Rales Abdomen: Nondistended, normal bowel sounds in all 4 quadrants, palpation reveals no  organomegaly Musculoskeletal: No joint deformities, positive range of motion Skin: Warm and dry  LABS:  BMET Recent Labs  Lab 02/04/19 2255  NA 135  K 3.2*  CL 100  CO2 22  BUN 7  CREATININE 1.06  GLUCOSE 237*    Electrolytes Recent Labs  Lab 02/04/19 2255  CALCIUM 8.2*    CBC Recent Labs  Lab 02/04/19 2255  WBC 19.2*  HGB 15.9  HCT 49.6  PLT 283    Coag's No results for input(s): APTT, INR in the last 168 hours.  Sepsis Markers No results for input(s): LATICACIDVEN, PROCALCITON, O2SATVEN in the last 168 hours.  ABG No results for input(s): PHART, PCO2ART, PO2ART in the last 168 hours.  Liver Enzymes Recent Labs  Lab 02/04/19 2255  AST 24  ALT 14  ALKPHOS 75  BILITOT 0.6  ALBUMIN 4.1    Cardiac Enzymes Recent Labs  Lab 02/04/19 2255  TROPONINI <0.03    Glucose Recent Labs  Lab 02/05/19 0147  GLUCAP 120*    Imaging Dg Chest 1 View  Result Date: 02/04/2019 CLINICAL DATA:  Dyspnea and hypoxia overdose. EXAM: CHEST  1 VIEW COMPARISON:  None. FINDINGS: Normal  heart size and mediastinal contours. Heterogeneous bilateral opacities in the central mid lower lung zone distribution. No pleural effusion or pneumothorax. No acute osseous abnormalities are seen. IMPRESSION: Heterogeneous central lower lung zone opacities may be pulmonary edema, pneumonia, or aspiration. Electronically Signed   By: Narda Rutherford M.D.   On: 02/04/2019 23:43   STUDIES:  None  CULTURES: Blood cultures x2 Respiratory panel COVID-19 swab  ANTIBIOTICS: Zosyn  SIGNIFICANT EVENTS: 02/05/2019: Admitted  LINES/TUBES: Peripheral IVs IO  DISCUSSION: 38 year old male presenting with fever, cough, acute hypoxic respiratory failure, bilateral lung opacities suggestive of pneumonia and the sinus tachycardia.  Given that he was recently discharged from a group setting-rehabilitation for opiate abuse and currently has a fever, we will rule out COVID-19.  ASSESSMENT   Acute hypoxic respiratory failure Rule out COVID-19 based on the presence of fever, cough and recent discharge from rehab which is considered a high risk setting Bilateral pneumonia on chest x-ray-bacteriuria versus aspiration versus viral etiology Unintentional overdose on opiates History of polysubstance abuse-failed rehab Hypokalemia Hypomagnesemia Hypophosphatemia Elevated blood glucose of 237 mg/dL without a diagnosis of diabetes  PLAN Continues BiPAP and titrate to nasal cannula as tolerated Respiratory panel, influenza and COVID-19  swab sent Broad-spectrum antibiotics as above Follow-up cultures Nebulized bronchodilators IV hydration Monitor and correct electrolytes Hemoglobin A1c with a.m. labs Best Practice: Code Status: Full code Diet: Regular GI prophylaxis: Not indicated VTE prophylaxis: Subcu Lovenox and SCDs   FAMILY  - Updates: No family at bedside.  Will update when available. Isidora Laham S. Tukov-Yual ANP-BC Pulmonary and Critical Care Medicine [facility] Pager [phone] or [phone]  NB: This document was prepared using Dragon voice recognition software and may include unintentional dictation errors.    02/05/2019, 4:41 AM

## 2019-02-05 NOTE — Progress Notes (Signed)
Left leg IO removed and intact. No bleeding noted. Site clean, dry, intact -> 4x4, transparent tape.

## 2019-02-05 NOTE — H&P (Signed)
[facility] [place] [facility]   PATIENT NAME: [name]    MR#:  [id number]  DATE OF BIRTH:  1980-11-28  DATE OF ADMISSION:  02/04/2019  PRIMARY CARE PHYSICIAN: System, Pcp Not In   REQUESTING/REFERRING PHYSICIAN: Emergency room physician  CHIEF COMPLAINT:   Chief Complaint  Patient presents with  . Drug Overdose    HISTORY OF PRESENT ILLNESS:  [name]  is a 38 y.o. male with a known history of substance abuse, opiate abuse was brought to the emergency room by EMS from home after patient had overdosed on fentanyl.  Patient reported that he injected unknown amount of fentanyl.  Patient was initially unresponsive with no pulse.  Patient was having pinpoint pupils and agonal respiration so he was given 4 mg of Narcan intranasally by EMS and placed on nonrebreather due to hypoxia with oxygen saturation 85% on room air and brought to the emergency room.  Patient reports that he just finished rehab for narcotics a week ago.  Patient with no history of fever or chest pain.  On arrival to emergency room he was placed on BiPAP.  Chest x-ray shows heterogenous lower lung opacities could be pulmonary edema pneumonia or aspiration.  White count is 19,000.  Troponin is normal.  Potassium is 3.2.  Patient was given IV Zosyn and breathing treatment.  Patient was referred to hospitalist service for admission.  Patient is currently alert awake tolerating BiPAP very well.  Oxygen saturation 95%.  PAST MEDICAL HISTORY:  History reviewed. No pertinent past medical history.  PAST SURGICAL HISTORY:  History reviewed. No pertinent surgical history.  SOCIAL HISTORY:   Social History   Tobacco Use  . Smoking status: Never Smoker  . Smokeless tobacco: Never Used  Substance Use Topics  . Alcohol use: No    FAMILY HISTORY:  No family history on file.  No history of CVA or CAD or malignancy.  DRUG ALLERGIES:  Not on File  REVIEW OF SYSTEMS:   ROS Constitutional:  Negative for fever. Cardiovascular: Negative for chest pain. Respiratory: Positive for shortness of breath Gastrointestinal: Negative for abdominal pain, vomiting and diarrhea. Musculoskeletal: Negative for back pain. Skin: Negative for rash. Neurological: Negative for headaches, focal weakness or numbness.  MEDICATIONS AT HOME:   Prior to Admission medications   Medication Sig Start Date End Date Taking? Authorizing Provider  cephALEXin (KEFLEX) 500 MG capsule Take 1 capsule (500 mg total) by mouth 2 (two) times daily. 06/06/17   Minna Antis, MD  QUEtiapine (SEROQUEL) 300 MG tablet Take 300 mg by mouth at bedtime.    [provider]      VITAL SIGNS:  Blood pressure (!) 156/110, pulse (!) 147, resp. rate 18, height 6' (1.829 m), weight 113.4 kg, SpO2 94 %.  PHYSICAL EXAMINATION:  Physical Exam  GENERAL:  38 y.o.-year-old patient lying in the bed with no acute distress.  EYES: Pupils equal, round, reactive to light and accommodation. No scleral icterus. Extraocular muscles intact.  HEENT: Head atraumatic, normocephalic. Oropharynx and nasopharynx clear.  NECK:  Supple, no jugular venous distention. No thyroid enlargement, no tenderness.  LUNGS: Basilar rales bilaterally, no wheezing,rhonchi or crepitation. No use of accessory muscles of respiration.  On BiPAP. CARDIOVASCULAR: S1, S2 normal. No murmurs, rubs, or gallops.  ABDOMEN: Soft, nontender, nondistended. Bowel sounds present. No organomegaly or mass.  EXTREMITIES: No pedal edema, cyanosis, or clubbing.  NEUROLOGIC: Cranial nerves II through XII are intact. Muscle strength 5/5 in all extremities. Sensation  intact. Gait not checked.  PSYCHIATRIC: The patient is alert and oriented x 3.  SKIN: No obvious rash, lesion, or ulcer.   LABORATORY PANEL:   CBC Recent Labs  Lab 02/04/19 2255  WBC 19.2*  HGB 15.9  HCT 49.6  PLT 283    ------------------------------------------------------------------------------------------------------------------  Chemistries  Recent Labs  Lab 02/04/19 2255  NA 135  K 3.2*  CL 100  CO2 22  GLUCOSE 237*  BUN 7  CREATININE 1.06  CALCIUM 8.2*  AST 24  ALT 14  ALKPHOS 75  BILITOT 0.6   ------------------------------------------------------------------------------------------------------------------  Cardiac Enzymes Recent Labs  Lab 02/04/19 2255  TROPONINI <0.03  EKG: Sinus tachycardia with nonspecific ST-T changes. ------------------------------------------------------------------------------------------------------------------  RADIOLOGY:  Dg Chest 1 View  Result Date: 02/04/2019 CLINICAL DATA:  Dyspnea and hypoxia overdose. EXAM: CHEST  1 VIEW COMPARISON:  None. FINDINGS: Normal heart size and mediastinal contours. Heterogeneous bilateral opacities in the central mid lower lung zone distribution. No pleural effusion or pneumothorax. No acute osseous abnormalities are seen. IMPRESSION: Heterogeneous central lower lung zone opacities may be pulmonary edema, pneumonia, or aspiration. Electronically Signed   By: Narda Rutherford M.D.   On: 02/04/2019 23:43      IMPRESSION AND PLAN:   This is a 37 year old African-American male with history of opioid drug abuse presented with fentanyl drug overdose was found to be in hypoxic respiratory failure.  1.  Acute hypoxic respiratory failure: Secondary to drug overdose and possible aspiration into respiratory tract.  Other differential could be Narcan induced pulmonary edema.  Patient will be admitted to ICU for close monitoring.  Patient is persistently tachycardic but his mental status has improved currently alert awake.  Tolerating BiPAP with oxygen saturation 95%.  Will recheck ABG.  Patient will be kept n.p.o.  2.  Drug overdose: Accidental.  Substance abuse consult.  3.  Patient will be kept n.p.o.  Lovenox subcu for DVT  prophylaxis    All the records are reviewed and case discussed with ED provider. Management plans discussed with the patient, family and they are in agreement.  CODE STATUS: full  TOTAL TIME TAKING CARE OF THIS PATIENT: 45 minutes.    Will Bonnet M.D on 02/05/2019 at 12:32 AM  Between 7am to 6pm - Pager - [phone]  After 6pm go to [email or web address] - Social research officer, government  [facility] [place] [facility]  Office  [phone]  CC: Primary care physician; System, Pcp Not In   Note: This dictation was prepared with Dragon dictation along with smaller phrase technology. Any transcriptional errors that result from this process are unintentional.

## 2019-02-05 NOTE — Progress Notes (Signed)
COVID-19 DISASTER DECLARATION:   FULL CONTACT PHYSICAL EXAMINATION WAS NOT POSSIBLE DUE TO TREATMENT OF COVID-19  AND CONSERVATION OF PERSONAL PROTECTIVE EQUIPMENT, LIMITED EXAM FINDINGS INCLUDE-elevated HR/aspiration pneumonia and DRUG OD    Patient assessed or the symptoms described in the history of present illness.  In the context of the Global COVID-19 pandemic, which necessitated consideration that the patient might be at risk for infection with the SARS-CoV-2 virus that causes COVID-19, Institutional protocols and algorithms that pertain to the evaluation of patients at risk for COVID-19 are in a state of rapid change based on information released by regulatory bodies including the CDC and federal and state organizations. These policies and algorithms were followed during the patient's care while in hospital.    INFECTIOUS DISEASE-THERAPY ASPIRATION PNEUMONIA Wean fio2 as tolerated -continue antibiotics as prescribed -follow up cultures -follow up ID consultation COVID TEST PENDING-LOW RISK     Lucie Leather, M.D.  [facility]  Medical Director [facility] Medical Director [facility] Cardio-Pulmonary Department

## 2019-02-05 NOTE — Progress Notes (Signed)
New orders received for bipap settings. Changes made. Patient tolerated well.

## 2019-02-05 NOTE — Progress Notes (Addendum)
Patient is alert x 4.  He is awake more now than at the beginning of the shift.  His temperature has remained within normal limits.  His heart rate has been elevated sinus tach.  He drank giner ale and ate some crackers.  His lung sounds are diminished, bowel sounds present, and he has been weaned to 2 L of oxygen.  Christean Grief, RN

## 2019-02-06 ENCOUNTER — Inpatient Hospital Stay: Payer: Self-pay

## 2019-02-06 ENCOUNTER — Encounter: Payer: Self-pay | Admitting: *Deleted

## 2019-02-06 DIAGNOSIS — F4323 Adjustment disorder with mixed anxiety and depressed mood: Secondary | ICD-10-CM

## 2019-02-06 LAB — PHOSPHORUS: Phosphorus: 4.7 mg/dL — ABNORMAL HIGH (ref 2.5–4.6)

## 2019-02-06 LAB — BASIC METABOLIC PANEL
Anion gap: 11 (ref 5–15)
BUN: 6 mg/dL (ref 6–20)
CO2: 25 mmol/L (ref 22–32)
Calcium: 8.1 mg/dL — ABNORMAL LOW (ref 8.9–10.3)
Chloride: 103 mmol/L (ref 98–111)
Creatinine, Ser: 1 mg/dL (ref 0.61–1.24)
GFR calc Af Amer: >60 mL/min (ref >60)
GFR calc non Af Amer: >60 mL/min (ref >60)
Glucose, Bld: 107 mg/dL — ABNORMAL HIGH (ref 70–99)
POTASSIUM: 4 mmol/L (ref 3.5–5.1)
SODIUM: 139 mmol/L (ref 135–145)

## 2019-02-06 LAB — MAGNESIUM: MAGNESIUM: 2.2 mg/dL (ref 1.7–2.4)

## 2019-02-06 LAB — CBC
HCT: 45.6 % (ref 39.0–52.0)
Hemoglobin: 14.8 g/dL (ref 13.0–17.0)
MCH: 30.1 pg (ref 26.0–34.0)
MCHC: 32.5 g/dL (ref 30.0–36.0)
MCV: 92.7 fL (ref 80.0–100.0)
Platelets: 255 10*3/uL (ref 150–400)
RBC: 4.92 MIL/uL (ref 4.22–5.81)
RDW: 13.5 % (ref 11.5–15.5)
WBC: 16.8 10*3/uL — ABNORMAL HIGH (ref 4.0–10.5)
nRBC: 0 % (ref 0.0–0.2)

## 2019-02-06 LAB — PROCALCITONIN: Procalcitonin: 1.24 ng/mL

## 2019-02-06 MED ORDER — MIRTAZAPINE 15 MG PO TABS
7.5000 mg | ORAL_TABLET | Freq: Every day | ORAL | Status: DC
  Administered 2019-02-06: 7.5 mg via ORAL
  Filled 2019-02-06: qty 1

## 2019-02-06 MED ORDER — LORAZEPAM 1 MG PO TABS: 1.0000 mg | ORAL_TABLET | ORAL | Status: DC | PRN

## 2019-02-06 MED ORDER — THIAMINE HCL 100 MG/ML IJ SOLN
Freq: Once | INTRAVENOUS | Status: AC
  Administered 2019-02-06: 17:00:00 via INTRAVENOUS
  Filled 2019-02-06 (×2): qty 1000

## 2019-02-06 MED ORDER — CLONIDINE HCL 0.1 MG PO TABS
0.1000 mg | ORAL_TABLET | Freq: Three times a day (TID) | ORAL | Status: DC
  Administered 2019-02-06 – 2019-02-07 (×4): 0.1 mg via ORAL
  Filled 2019-02-06 (×4): qty 1

## 2019-02-06 MED ORDER — QUETIAPINE FUMARATE 25 MG PO TABS
25.0000 mg | ORAL_TABLET | Freq: Three times a day (TID) | ORAL | Status: DC | PRN
  Administered 2019-02-06: 25 mg via ORAL
  Filled 2019-02-06: qty 1

## 2019-02-06 MED ORDER — LORAZEPAM 2 MG/ML IJ SOLN
1.0000 mg | INTRAMUSCULAR | Status: DC | PRN
  Filled 2019-02-06: qty 1

## 2019-02-06 MED ORDER — VITAMIN B-1 100 MG PO TABS
100.0000 mg | ORAL_TABLET | Freq: Every day | ORAL | Status: DC
  Administered 2019-02-07: 100 mg via ORAL
  Filled 2019-02-06: qty 1

## 2019-02-06 MED ORDER — ADULT MULTIVITAMIN W/MINERALS CH
1.0000 | ORAL_TABLET | Freq: Every day | ORAL | Status: DC
  Administered 2019-02-07: 1 via ORAL
  Filled 2019-02-06: qty 1

## 2019-02-06 MED ORDER — FOLIC ACID 1 MG PO TABS
1.0000 mg | ORAL_TABLET | ORAL | Status: DC
  Administered 2019-02-07: 1 mg via ORAL
  Filled 2019-02-06: qty 1

## 2019-02-06 NOTE — Consult Note (Addendum)
[facility] Face-to-Face Psychiatry Consult   Reason for Consult:  Heroin overdose Referring Physician:  Dr. Karna Christmas Patient Identification: [name] MRN:  [id number] Principal Diagnosis: Respiratory failure with hypoxia [facility]) Diagnosis:  Principal Problem:   Respiratory failure with hypoxia (HCC) Active Problems:   Aspiration pneumonia of both lower lobes due to gastric secretions (HCC)   Drug overdose, accidental or unintentional, initial encounter   Adjustment disorder with mixed anxiety and depressed mood  Patient is seen, chart is reviewed, attempts for collateral made to patient's mother [name])  Total Time spent with patient: 1 hour  Subjective:  " I am doing okay."  HPI:  [name] is a 38 y.o. male patient admitted with  a known history of substance abuse, opiate abuse was brought to the emergency room by EMS from home after patient had overdosed on fentanyl.  Patient reported that he injected unknown amount of fentanyl.  Patient was initially unresponsive with no pulse.  Patient was having pinpoint pupils and agonal respiration so he was given 4 mg of Narcan intranasally by EMS and placed on nonrebreather due to hypoxia with oxygen saturation 85% on room air and brought to the emergency room.  Patient reports that he just finished rehab for narcotics a week ago.  Patient with no history of fever or chest pain.  On arrival to emergency room he was placed on BiPAP.  Chest x-ray shows heterogenous lower lung opacities could be pulmonary edema pneumonia or aspiration.  White count is 19,000.  Troponin is normal.  Potassium is 3.2.  Patient was given IV Zosyn and breathing treatment.  Patient was referred to hospitalist service for admission.  Patient is currently alert awake tolerating BiPAP very well.  Oxygen saturation 95%.Marland Kitchen  Psychiatry consult is requested for further evaluation of suicidality and evaluation for ongoing substance use treatment.  On evaluation, patient  reports that he got out of drug rehab from [facility] on 19th of March after 3 months of residential treatment.  He reports that he moved home with his mother and was able to not use opiates for 7 days.  Patient has little memory of the events surrounding his resuming use.  He reports that he used "a little bit the day before this overdose, and then tried a little bit more the next day."  Endorses that he injects opiates.  Patient does not have Narcan as a prescription.  Patient denies that this was a suicide attempt.  Patient denies any history of past self-harm or suicide attempts.  Patient currently endorses "feeling bad " about his relapse.  Patient denies any active suicidal ideation, plan, or intent.  Patient denies any homicidal ideation.  He is denying any auditory or visual hallucinations.  Patient denies any other psychiatric history other than substance use.  He does endorse poor sleep and appetite with little enjoyment in usual activities for the past several weeks.  He states his concentration is "fine", however he reports decreased energy and motivation.  Patient is guarded, however he denies having opiate cravings.  Patient does not want to resume treatment for addiction services to include outpatient treatment at this time.  Collateral from mother: Mother reports that she does not think this was a suicide attempt. She denies that he has a past psychiatric history.  He was discharged on chlorpromazine for sleep from rehab, and told mother that  He has been isolating himself and having low motivation.  He does seem to have a better mood.  Patient will  be able to live with mother at discharge.  Mother would like for patient to continue in some form of treatment, and will encourage her son to stay in treatment.   Past Psychiatric History: Opiate addiction reports for 6 years  Risk to Self:   Accidental due to substance use Risk to Others:  Denies Prior Inpatient Therapy:  Residential  treatment for opiate addiction, however no other inpatient psychiatric admission Prior Outpatient Therapy:  Denies  Past Medical History: History reviewed. No pertinent past medical history. History reviewed. No pertinent surgical history. Family History: No family history on file. Family Psychiatric  History: Denies  Social History:  Social History   Substance and Sexual Activity  Alcohol Use No     Social History   Substance and Sexual Activity  Drug Use No    Social History   Socioeconomic History  . Marital status: Single    Spouse name: Not on file  . Number of children: Not on file  . Years of education: Not on file  . Highest education level: Not on file  Occupational History  . Not on file  Social Needs  . Financial resource strain: Not on file  . Food insecurity:    Worry: Not on file    Inability: Not on file  . Transportation needs:    Medical: Not on file    Non-medical: Not on file  Tobacco Use  . Smoking status: Never Smoker  . Smokeless tobacco: Never Used  Substance and Sexual Activity  . Alcohol use: No  . Drug use: No  . Sexual activity: Not on file  Lifestyle  . Physical activity:    Days per week: Not on file    Minutes per session: Not on file  . Stress: Not on file  Relationships  . Social connections:    Talks on phone: Not on file    Gets together: Not on file    Attends religious service: Not on file    Active member of club or organization: Not on file    Attends meetings of clubs or organizations: Not on file    Relationship status: Not on file  Other Topics Concern  . Not on file  Social History Narrative  . Not on file   Additional Social History:  Patient states he lives with his mother. 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  Patient denies any past or pending legal issues.  Patient denies alcohol or illicit  substance use.  Allergies:  Not on File  Labs:  Results for orders placed or performed during the hospital encounter of 02/04/19 (from the past 48 hour(s))  CBC with Differential/Platelet     Status: Abnormal   Collection Time: 02/04/19 10:55 PM  Result Value Ref Range   WBC 19.2 (H) 4.0 - 10.5 K/uL   RBC 5.30 4.22 - 5.81 MIL/uL   Hemoglobin 15.9 13.0 - 17.0 g/dL   HCT 98.1 19.1 - 47.8 %   MCV 93.6 80.0 - 100.0 fL   MCH 30.0 26.0 - 34.0 pg   MCHC 32.1 30.0 - 36.0 g/dL   RDW 29.5 62.1 - 30.8 %   Platelets 283 150 - 400 K/uL   nRBC 0.0 0.0 - 0.2 %   Neutrophils Relative % 78 %   Neutro Abs 15.1 (H) 1.7 - 7.7 K/uL   Lymphocytes Relative  17 %   Lymphs Abs 3.3 0.7 - 4.0 K/uL   Monocytes Relative 3 %   Monocytes Absolute 0.6 0.1 - 1.0 K/uL   Eosinophils Relative 1 %   Eosinophils Absolute 0.1 0.0 - 0.5 K/uL   Basophils Relative 0 %   Basophils Absolute 0.1 0.0 - 0.1 K/uL   Immature Granulocytes 1 %   Abs Immature Granulocytes 0.09 (H) 0.00 - 0.07 K/uL    Comment: Performed at [facility], [address] [place], [place] [address]  Comprehensive metabolic panel     Status: Abnormal   Collection Time: 02/04/19 10:55 PM  Result Value Ref Range   Sodium 135 135 - 145 mmol/L   Potassium 3.2 (L) 3.5 - 5.1 mmol/L   Chloride 100 98 - 111 mmol/L   CO2 22 22 - 32 mmol/L   Glucose, Bld 237 (H) 70 - 99 mg/dL   BUN 7 6 - 20 mg/dL   Creatinine, Ser 4.03 0.61 - 1.24 mg/dL   Calcium 8.2 (L) 8.9 - 10.3 mg/dL   Total Protein 7.1 6.5 - 8.1 g/dL   Albumin 4.1 3.5 - 5.0 g/dL   AST 24 15 - 41 U/L   ALT 14 0 - 44 U/L   Alkaline Phosphatase 75 38 - 126 U/L   Total Bilirubin 0.6 0.3 - 1.2 mg/dL   GFR calc non Af Amer >60 >60 mL/min   GFR calc Af Amer >60 >60 mL/min   Anion gap 13 5 - 15    Comment: Performed at [facility], [address] [place], [place] [address]  Troponin I - ONCE - STAT     Status: None   Collection Time: 02/04/19 10:55 PM  Result Value Ref Range    Troponin I <0.03 <0.03 ng/mL    Comment: Performed at [facility], [address] [place], [place] [address]  Blood gas, venous     Status: Abnormal   Collection Time: 02/04/19 11:39 PM  Result Value Ref Range   pH, Ven 7.28 7.250 - 7.430   pCO2, Ven 71 (HH) 44.0 - 60.0 mmHg    Comment: CRITICAL RESULT CALLED TO, READ BACK BY AND VERIFIED WITH: DAWN CHARGE RN  [id number] DT  DR FORBACH IN WITH CRITICAL PT NOW IN CHARGE OF THIS PT.     pO2, Ven 45.0 32.0 - 45.0 mmHg   Bicarbonate 33.4 (H) 20.0 - 28.0 mmol/L   Acid-Base Excess 4.3 (H) 0.0 - 2.0 mmol/L   O2 Saturation 74.3 %   Patient temperature 37.0    Collection site LINE    Sample type VENOUS     Comment: Performed at [facility], [address] [place], [place] [address]  Ethanol     Status: None   Collection Time: 02/05/19 12:33 AM  Result Value Ref Range   Alcohol, Ethyl (B) <10 <10 mg/dL    Comment: (NOTE) Lowest detectable limit for serum alcohol is 10 mg/dL. For medical purposes only. Performed at [facility], [address] [place], [place] [address]   Acetaminophen level     Status: Abnormal   Collection Time: 02/05/19 12:33 AM  Result Value Ref Range   Acetaminophen (Tylenol), Serum <10 (L) 10 - 30 ug/mL    Comment: (NOTE) Therapeutic concentrations vary significantly. A range of 10-30 ug/mL  may be an effective concentration for many patients. However, some  are best treated at concentrations outside of this range. Acetaminophen concentrations >150 ug/mL at 4 hours  after ingestion  and >50 ug/mL at 12 hours after ingestion are often associated with  toxic reactions. Performed at [facility], [address] [place], [place] [address]   Salicylate level     Status: None   Collection Time: 02/05/19 12:33 AM  Result Value Ref Range   Salicylate Lvl <7.0 2.8 - 30.0 mg/dL    Comment: Performed at [facility], [address] [place], [place] [address]   Urine Drug Screen, Qualitative ([facility] only)     Status: Abnormal   Collection Time: 02/05/19  1:31 AM  Result Value Ref Range   Tricyclic, Ur Screen NONE DETECTED NONE DETECTED   Amphetamines, Ur Screen NONE DETECTED NONE DETECTED   MDMA (Ecstasy)Ur Screen NONE DETECTED NONE DETECTED   Cocaine Metabolite,Ur [place] NONE DETECTED NONE DETECTED   Opiate, Ur Screen POSITIVE (A) NONE DETECTED   Phencyclidine (PCP) Ur S NONE DETECTED NONE DETECTED   Cannabinoid 50 Ng, Ur [place] NONE DETECTED NONE DETECTED   Barbiturates, Ur Screen NONE DETECTED NONE DETECTED   Benzodiazepine, Ur Scrn NONE DETECTED NONE DETECTED   Methadone Scn, Ur NONE DETECTED NONE DETECTED    Comment: (NOTE) Tricyclics + metabolites, urine    Cutoff 1000 ng/mL Amphetamines + metabolites, urine  Cutoff 1000 ng/mL MDMA (Ecstasy), urine              Cutoff 500 ng/mL Cocaine Metabolite, urine          Cutoff 300 ng/mL Opiate + metabolites, urine        Cutoff 300 ng/mL Phencyclidine (PCP), urine         Cutoff 25 ng/mL Cannabinoid, urine                 Cutoff 50 ng/mL Barbiturates + metabolites, urine  Cutoff 200 ng/mL Benzodiazepine, urine              Cutoff 200 ng/mL Methadone, urine                   Cutoff 300 ng/mL The urine drug screen provides only a preliminary, unconfirmed analytical test result and should not be used for non-medical purposes. Clinical consideration and professional judgment should be applied to any positive drug screen result due to possible interfering substances. A more specific alternate chemical method must be used in order to obtain a confirmed analytical result. Gas chromatography / mass spectrometry (GC/MS) is the preferred confirmat ory method. Performed at [facility], [address] [place] [address] [place], [place] [address]   Glucose, capillary     Status: Abnormal   Collection Time: 02/05/19  1:47 AM  Result Value Ref Range   Glucose-Capillary 120 (H) 70 - 99 mg/dL  MRSA PCR Screening      Status: None   Collection Time: 02/05/19  1:48 AM  Result Value Ref Range   MRSA by PCR NEGATIVE NEGATIVE    Comment:        The GeneXpert MRSA Assay (FDA approved for NASAL specimens only), is one component of a comprehensive MRSA colonization surveillance program. It is not intended to diagnose MRSA infection nor to guide or monitor treatment for MRSA infections. Performed at [facility], [address] [place], [place] [address]   Influenza panel by PCR (type A & B)     Status: None   Collection Time: 02/05/19  4:15 AM  Result Value Ref Range   Influenza A By PCR NEGATIVE NEGATIVE   Influenza B By PCR NEGATIVE NEGATIVE  Comment: (NOTE) The Xpert Xpress Flu assay is intended as an aid in the diagnosis of  influenza and should not be used as a sole basis for treatment.  This  assay is FDA approved for nasopharyngeal swab specimens only. Nasal  washings and aspirates are unacceptable for Xpert Xpress Flu testing. Performed at [facility], [address] [place], [place] [address]   Respiratory Panel by PCR     Status: None   Collection Time: 02/05/19  4:15 AM  Result Value Ref Range   Adenovirus NOT DETECTED NOT DETECTED   Coronavirus 229E NOT DETECTED NOT DETECTED    Comment: (NOTE) The Coronavirus on the Respiratory Panel, DOES NOT test for the novel  Coronavirus (2019 nCoV)    Coronavirus HKU1 NOT DETECTED NOT DETECTED   Coronavirus NL63 NOT DETECTED NOT DETECTED   Coronavirus OC43 NOT DETECTED NOT DETECTED   Metapneumovirus NOT DETECTED NOT DETECTED   Rhinovirus / Enterovirus NOT DETECTED NOT DETECTED   Influenza A NOT DETECTED NOT DETECTED   Influenza B NOT DETECTED NOT DETECTED   Parainfluenza Virus 1 NOT DETECTED NOT DETECTED   Parainfluenza Virus 2 NOT DETECTED NOT DETECTED   Parainfluenza Virus 3 NOT DETECTED NOT DETECTED   Parainfluenza Virus 4 NOT DETECTED NOT DETECTED   Respiratory Syncytial Virus NOT DETECTED NOT DETECTED    Bordetella pertussis NOT DETECTED NOT DETECTED   Chlamydophila pneumoniae NOT DETECTED NOT DETECTED   Mycoplasma pneumoniae NOT DETECTED NOT DETECTED    Comment: Performed at [facility], [address], [place], [place] [address]  CBC     Status: Abnormal   Collection Time: 02/05/19  4:57 AM  Result Value Ref Range   WBC 17.3 (H) 4.0 - 10.5 K/uL   RBC 5.27 4.22 - 5.81 MIL/uL   Hemoglobin 15.9 13.0 - 17.0 g/dL   HCT 19.1 47.8 - 29.5 %   MCV 92.4 80.0 - 100.0 fL   MCH 30.2 26.0 - 34.0 pg   MCHC 32.6 30.0 - 36.0 g/dL   RDW 62.1 30.8 - 65.7 %   Platelets 283 150 - 400 K/uL   nRBC 0.0 0.0 - 0.2 %    Comment: Performed at [facility], [address] [place], [place] [address]  Basic metabolic panel     Status: Abnormal   Collection Time: 02/05/19  4:57 AM  Result Value Ref Range   Sodium 136 135 - 145 mmol/L   Potassium 5.1 3.5 - 5.1 mmol/L   Chloride 103 98 - 111 mmol/L   CO2 24 22 - 32 mmol/L   Glucose, Bld 173 (H) 70 - 99 mg/dL   BUN 6 6 - 20 mg/dL   Creatinine, Ser 2.95 0.61 - 1.24 mg/dL   Calcium 8.6 (L) 8.9 - 10.3 mg/dL   GFR calc non Af Amer >60 >60 mL/min   GFR calc Af Amer >60 >60 mL/min   Anion gap 9 5 - 15    Comment: Performed at [facility], [address], [place], [place] [address]  Magnesium     Status: Abnormal   Collection Time: 02/05/19  4:57 AM  Result Value Ref Range   Magnesium 1.5 (L) 1.7 - 2.4 mg/dL    Comment: Performed at [facility], [address] [place], [place] [address]  Phosphorus     Status: Abnormal   Collection Time: 02/05/19  4:57 AM  Result Value Ref Range   Phosphorus 1.0 (LL) 2.5 - 4.6 mg/dL  Comment: CRITICAL RESULT CALLED TO, READ BACK BY AND VERIFIED WITH BRITTONLEE WESTCHESTER AT 0677 02/03/2019.  TFK Performed at [facility], [address] [place], [place] [address]   Troponin I - Add-On to previous collection     Status: None   Collection Time: 02/05/19  4:57 AM  Result  Value Ref Range   Troponin I <0.03 <0.03 ng/mL    Comment: Performed at [facility], [address] [place], [place] [address]  Hemoglobin A1c     Status: Abnormal   Collection Time: 02/05/19  4:57 AM  Result Value Ref Range   Hgb A1c MFr Bld 5.9 (H) 4.8 - 5.6 %    Comment: (NOTE) Pre diabetes:          5.7%-6.4% Diabetes:              >6.4% Glycemic control for   <7.0% adults with diabetes    Mean Plasma Glucose 122.63 mg/dL    Comment: Performed at [facility], [address], [place], [place] [address]  Culture, blood (Routine X 2) w Reflex to ID Panel     Status: None (Preliminary result)   Collection Time: 02/05/19 10:19 AM  Result Value Ref Range   Specimen Description BLOOD L WRIST    Special Requests      BOTTLES DRAWN AEROBIC AND ANAEROBIC Blood Culture adequate volume   Culture      NO GROWTH < 24 HOURS Performed at [facility], [address], [place], [place] [address]    Report Status PENDING   Culture, blood (Routine X 2) w Reflex to ID Panel     Status: None (Preliminary result)   Collection Time: 02/05/19 10:46 AM  Result Value Ref Range   Specimen Description BLOOD L HAND    Special Requests      BOTTLES DRAWN AEROBIC AND ANAEROBIC Blood Culture adequate volume   Culture      NO GROWTH < 24 HOURS Performed at [facility], [address] [place], [place] [address]    Report Status PENDING   Basic metabolic panel     Status: Abnormal   Collection Time: 02/05/19  6:33 PM  Result Value Ref Range   Sodium 136 135 - 145 mmol/L   Potassium 4.3 3.5 - 5.1 mmol/L   Chloride 105 98 - 111 mmol/L   CO2 23 22 - 32 mmol/L   Glucose, Bld 116 (H) 70 - 99 mg/dL   BUN 8 6 - 20 mg/dL   Creatinine, Ser 9.50 0.61 - 1.24 mg/dL   Calcium 8.0 (L) 8.9 - 10.3 mg/dL   GFR calc non Af Amer >60 >60 mL/min   GFR calc Af Amer >60 >60 mL/min   Anion gap 8 5 - 15    Comment: Performed at [facility], [address],  [place], [place] [address]  Phosphorus     Status: Abnormal   Collection Time: 02/05/19  6:33 PM  Result Value Ref Range   Phosphorus 2.1 (L) 2.5 - 4.6 mg/dL    Comment: Performed at [facility], [address] [place], [place] [address]  Basic metabolic panel     Status: Abnormal   Collection Time: 02/06/19  4:41 AM  Result Value Ref Range   Sodium 139 135 - 145 mmol/L   Potassium 4.0 3.5 - 5.1 mmol/L   Chloride 103 98 - 111 mmol/L   CO2 25 22 - 32 mmol/L   Glucose, Bld  107 (H) 70 - 99 mg/dL   BUN 6 6 - 20 mg/dL   Creatinine, Ser 1.61 0.61 - 1.24 mg/dL   Calcium 8.1 (L) 8.9 - 10.3 mg/dL   GFR calc non Af Amer >60 >60 mL/min   GFR calc Af Amer >60 >60 mL/min   Anion gap 11 5 - 15    Comment: Performed at [facility], [address] [place], [place] [address]  Magnesium     Status: None   Collection Time: 02/06/19  4:41 AM  Result Value Ref Range   Magnesium 2.2 1.7 - 2.4 mg/dL    Comment: Performed at [facility], [address] [place], [place] [address]  Phosphorus     Status: Abnormal   Collection Time: 02/06/19  4:41 AM  Result Value Ref Range   Phosphorus 4.7 (H) 2.5 - 4.6 mg/dL    Comment: Performed at [facility], [address] [place], [place] [address]  CBC     Status: Abnormal   Collection Time: 02/06/19  4:41 AM  Result Value Ref Range   WBC 16.8 (H) 4.0 - 10.5 K/uL   RBC 4.92 4.22 - 5.81 MIL/uL   Hemoglobin 14.8 13.0 - 17.0 g/dL   HCT 78.2 95.6 - 21.3 %   MCV 92.7 80.0 - 100.0 fL   MCH 30.1 26.0 - 34.0 pg   MCHC 32.5 30.0 - 36.0 g/dL   RDW 08.6 57.8 - 46.9 %   Platelets 255 150 - 400 K/uL   nRBC 0.0 0.0 - 0.2 %    Comment: Performed at [facility], [address] [place], [place] [address]  Procalcitonin - Baseline     Status: None   Collection Time: 02/06/19  4:41 AM  Result Value Ref Range   Procalcitonin 1.24 ng/mL    Comment:        Interpretation: PCT > 0.5 ng/mL and <= 2 ng/mL: Systemic  infection (sepsis) is possible, but other conditions are known to elevate PCT as well. (NOTE)       Sepsis PCT Algorithm           Lower Respiratory Tract                                      Infection PCT Algorithm    ----------------------------     ----------------------------         PCT < 0.25 ng/mL                PCT < 0.10 ng/mL         Strongly encourage             Strongly discourage   discontinuation of antibiotics    initiation of antibiotics    ----------------------------     -----------------------------       PCT 0.25 - 0.50 ng/mL            PCT 0.10 - 0.25 ng/mL               OR       >80% decrease in PCT            Discourage initiation of  antibiotics      Encourage discontinuation           of antibiotics    ----------------------------     -----------------------------         PCT >= 0.50 ng/mL              PCT 0.26 - 0.50 ng/mL                AND       <80% decrease in PCT             Encourage initiation of                                             antibiotics       Encourage continuation           of antibiotics    ----------------------------     -----------------------------        PCT >= 0.50 ng/mL                  PCT > 0.50 ng/mL               AND         increase in PCT                  Strongly encourage                                      initiation of antibiotics    Strongly encourage escalation           of antibiotics                                     -----------------------------                                           PCT <= 0.25 ng/mL                                                 OR                                        > 80% decrease in PCT                                     Discontinue / Do not initiate                                             antibiotics Performed at [facility], [address], [place], [place] [address]     Current Facility-Administered Medications   Medication Dose Route Frequency Provider Last Rate Last  Dose  . 0.9 %  sodium chloride infusion   Intravenous PRN Andreas Ohm, NP   Stopped at 02/06/19 0048  . acetaminophen (TYLENOL) tablet 650 mg  650 mg Oral Q4H PRN Will Bonnet, MD   650 mg at 02/05/19 1640  . Ampicillin-Sulbactam (UNASYN) 3 g in sodium chloride 0.9 % 100 mL IVPB  3 g Intravenous Q6H Kasa, Kurian, MD 200 mL/hr at 02/06/19 1400 3 g at 02/06/19 1400  . cloNIDine (CATAPRES) tablet 0.1 mg  0.1 mg Oral TID Vida Rigger, MD   0.1 mg at 02/06/19 0933  . enoxaparin (LOVENOX) injection 40 mg  40 mg Subcutaneous Q24H Erin Fulling, MD   40 mg at 02/06/19 0934  . Ipratropium-Albuterol (COMBIVENT) respimat 1 puff  1 puff Inhalation Q6H PRN Erin Fulling, MD      . LORazepam (ATIVAN) injection 1-2 mg  1-2 mg Intravenous Q1H PRN Vida Rigger, MD      . LORazepam (ATIVAN) tablet 1-2 mg  1-2 mg Oral Q1H PRN Bertram Savin, RPH      . multivitamin with minerals tablet 1 tablet  1 tablet Oral Daily Vida Rigger, MD      . ondansetron (ZOFRAN) injection 4 mg  4 mg Intravenous Q6H PRN Avaiya, Hitesh, MD      . QUEtiapine (SEROQUEL) tablet 300 mg  300 mg Oral QHS Erin Fulling, MD   300 mg at 02/05/19 2141  . sodium chloride 0.9 % 1,000 mL with thiamine 100 mg, folic acid 1 mg, multivitamins adult 10 mL infusion   Intravenous Once Vida Rigger, MD      . thiamine (VITAMIN B-1) tablet 100 mg  100 mg Oral Daily Vida Rigger, MD        Musculoskeletal: Strength & Muscle Tone: within normal limits Gait & Station: unable to assess Patient leans: N/A  Psychiatric Specialty Exam: Physical Exam  Nursing note and vitals reviewed. Constitutional: He is oriented to person, place, and time. He appears well-developed and well-nourished. No distress.  HENT:  Head: Normocephalic and atraumatic.  Eyes: EOM are normal.  Cardiovascular: Normal rate and regular rhythm.  Respiratory: Effort normal. No respiratory distress.   Wearing oxygen via nasal cannula  Musculoskeletal: Normal range of motion.  Neurological: He is alert and oriented to person, place, and time.    Review of Systems  Psychiatric/Behavioral: Positive for substance abuse (heroin). Negative for depression, hallucinations and suicidal ideas. The patient is not nervous/anxious and does not have insomnia.   All other systems reviewed and are negative.   Blood pressure 110/84, pulse (!) 121, temperature 98.7 F (37.1 C), temperature source Oral, resp. rate 18, height 6' (1.829 m), weight 113.4 kg, SpO2 94 %.Body mass index is 33.91 kg/m.  General Appearance: Fairly Groomed  Eye Contact:  Minimal  Speech:  Slow  Volume:  Decreased  Mood:  Depressed  Affect:  Congruent and Flat  Thought Process:  Linear  Orientation:  Other:  person, place and year  Thought Content:  Hallucinations: None  Suicidal Thoughts:  No  Homicidal Thoughts:  No  Memory:  Immediate;   Fair Recent;   Poor Remote;   Fair  Judgement:  Poor  Insight:  Present  Psychomotor Activity:  Decreased  Concentration:  Attention Span: Fair  Recall:  Poor  Fund of Knowledge:  Fair  Language:  Fair  Akathisia:  No  Handed:  Right  AIMS (if indicated):     Assets:  Housing  ADL's:  Impaired  Cognition:  Impaired,  Mild  Sleep:   adequate    Treatment Plan Summary: Medication management  Discontinue Seroquel 300 mg at bedtime and change to Seroquel 25 mg every 8 hours as needed for agitation. Start Mirtazapine 7.5 mgh HS for depressed mood, appetite stimulation and improve sleep  Disposition: Patient does not meet criteria for psychiatric inpatient admission. Supportive therapy provided about ongoing stressors. Discussed crisis plan, support from social network, calling 911, coming to the Emergency Department, and calling Suicide Hotline.   Encourage further collateral from patient's mother to assist in discharge planning.  Continue to encourage patient to be engaged  with ongoing substance use rehabilitation efforts as an outpatient.  Mariel CraftSHEILA M Lota Leamer, MD 02/06/2019 3:36 PM

## 2019-02-06 NOTE — Plan of Care (Signed)
Pt on antibiotic; calm & cooperative.

## 2019-02-06 NOTE — Progress Notes (Signed)
CRITICAL CARE NOTE        SUBJECTIVE FINDINGS & SIGNIFICANT EVENTS   Patient more alert, able to take PO remains tachycardic, monitoring for drug withdrawal syndrome. 410pm- Discussed with Dr Viviano Simas- psychiatry - no need for involuntary commitment - starting on mirtazipine.  PAST MEDICAL HISTORY   History reviewed. No pertinent past medical history.   SURGICAL HISTORY   History reviewed. No pertinent surgical history.   FAMILY HISTORY   No family history on file.   SOCIAL HISTORY   Social History   Tobacco Use  . Smoking status: Never Smoker  . Smokeless tobacco: Never Used  Substance Use Topics  . Alcohol use: No  . Drug use: No     MEDICATIONS   Current Medication:  Current Facility-Administered Medications:  .  0.9 %  sodium chloride infusion, , Intravenous, PRN, Tukov-Yual, Alroy Bailiff, NP, Stopped at 02/06/19 0048 .  acetaminophen (TYLENOL) tablet 650 mg, 650 mg, Oral, Q4H PRN, Avaiya, Hitesh, MD, 650 mg at 02/05/19 1640 .  Ampicillin-Sulbactam (UNASYN) 3 g in sodium chloride 0.9 % 100 mL IVPB, 3 g, Intravenous, Q6H, Erin Fulling, MD, Stopped at 02/06/19 [phone] .  enoxaparin (LOVENOX) injection 40 mg, 40 mg, Subcutaneous, Q24H, Kasa, Kurian, MD .  Ipratropium-Albuterol (COMBIVENT) respimat 1 puff, 1 puff, Inhalation, Q6H PRN, Belia Heman, Kurian, MD .  ondansetron (ZOFRAN) injection 4 mg, 4 mg, Intravenous, Q6H PRN, Avaiya, Hitesh, MD .  QUEtiapine (SEROQUEL) tablet 300 mg, 300 mg, Oral, QHS, Kasa, Kurian, MD, 300 mg at 02/05/19 2141    ALLERGIES   Patient has no allergy information on record.    REVIEW OF SYSTEMS   10 sys ROS neg except as per subjective findings  PHYSICAL EXAMINATION   Vitals:   02/06/19 0500 02/06/19 0600  BP: (!) 133/103 110/84  Pulse: (!) 111 (!) 121   Resp: 16 18  Temp:    SpO2: 94% 94%    GENERAL:mild distress due to acutely ill state and possible drug withdrawal HEAD: Normocephalic, atraumatic.  EYES: Pupils equal, round, reactive to light.  No scleral icterus.  MOUTH: Moist mucosal membrane. NECK:  No JVD.  PULMONARY: unable to auscultate CARDIOVASCULAR: Sinus tach GASTROINTESTINAL:  non-distended. MUSCULOSKELETAL: able to move all 4 extermities  NEUROLOGIC: Mild distress due to acute illness SKIN:intact  COVID-19 DISASTER DECLARATION:   FULL CONTACT PHYSICAL EXAMINATION WAS NOT POSSIBLE DUE TO TREATMENT OF COVID-19  AND CONSERVATION OF PERSONAL PROTECTIVE EQUIPMENT, LIMITED EXAM FINDINGS INCLUDE-    Patient assessed or the symptoms described in the history of present illness.  In the context of the Global COVID-19 pandemic, which necessitated consideration that the patient might be at risk for infection with the SARS-CoV-2 virus that causes COVID-19, Institutional protocols and algorithms that pertain to the evaluation of patients at risk for COVID-19 are in a state of rapid change based on information released by regulatory bodies including the CDC and federal and state organizations. These policies and algorithms were followed during the patient's care while in hospital.    LABS AND IMAGING     -I personally reviewed most recent blood work, imaging and microbiology - significant findings today are leucosytosis  LAB RESULTS: Recent Labs  Lab 02/05/19 0457 02/05/19 1833 02/06/19 0441  NA 136 136 139  K 5.1 4.3 4.0  CL 103 105 103  CO2 24 23 25   BUN 6 8 6   CREATININE 0.95 0.97 1.00  GLUCOSE 173* 116* 107*   Recent Labs  Lab 02/04/19 2255 02/05/19 0457  02/06/19 0441  HGB 15.9 15.9 14.8  HCT 49.6 48.7 45.6  WBC 19.2* 17.3* 16.8*  PLT 283 283 255     IMAGING RESULTS: No results found.    ASSESSMENT AND PLAN    -Multidisciplinary rounds held today  Acute Hypoxic and hypercarbic Respiratory  Failure -due to acute drug overdose -continue Bronchodilator Therapy -Wean Fio2 and PEEP as tolerated -bilateral infiltrate - CXR this am - initially febrile -Aspiration pneumonia vs opioid induced- c/w Unasyn   Drug Withdrawal syndrome   - currently remains tachycardic-starting clonidine .1 tid and banana bag for poss EtOH abuse in background  - monitor for re-feeding syndrome - phos <1.0 -pharmD to replete  -CIWA initiated -will use ativan only with po clonidine -oxygen as needed -Lasix as tolerated -CXR pending ICU monitoring  Renal Failure-most likely due to ATN -follow chem 7 -follow UO -continue Foley Catheter-assess need daily   NEUROLOGY - diaphoretic , alert mildly aggitated -psysch eval for poss suicidal ideation    ID -continue IV abx as prescibed- Unasyn 3g q6h - day 2 today -follow up cultures  GI/Nutrition GI PROPHYLAXIS as indicated DIET-->TF's as tolerated Constipation protocol as indicated  ENDO - ICU hypoglycemic\Hyperglycemia protocol -check FSBS per protocol   ELECTROLYTES -follow labs as needed -replace as needed -pharmacy consultation   DVT/GI PRX ordered -SCDs  TRANSFUSIONS AS NEEDED MONITOR FSBS ASSESS the need for LABS as needed   Critical care provider statement:    Critical care time (minutes):  34   Critical care time was exclusive of:  Separately billable procedures and treating other patients   Critical care was necessary to treat or prevent imminent or life-threatening deterioration of the following conditions:  Acute hypoxemic respiratory failure, drug overdose, acute encephalopathy, sinus tachycardia, hypophosphatemia, hypokalemia, drug withdrawal syndrome   Critical care was time spent personally by me on the following activities:  Development of treatment plan with patient or surrogate, discussions with consultants, evaluation of patient's response to treatment, examination of patient, obtaining history from patient or  surrogate, ordering and performing treatments and interventions, ordering and review of laboratory studies and re-evaluation of patient's condition.  I assumed direction of critical care for this patient from another provider in my specialty: no    This document was prepared using Dragon voice recognition software and may include unintentional dictation errors.    Vida Rigger, M.D.  Division of Pulmonary & Critical Care Medicine  [facility]

## 2019-02-06 NOTE — Progress Notes (Signed)
Pharmacy Electrolyte Monitoring Consult:  Pharmacy consulted to assist in monitoring and replacing electrolytes in this 38 y.o. male admitted on 02/04/2019 with Drug Overdose   Labs:  Sodium (mmol/L)  Date Value  02/06/2019 139   Potassium (mmol/L)  Date Value  02/06/2019 4.0   Magnesium (mg/dL)  Date Value  28/00/3491 2.2   Phosphorus (mg/dL)  Date Value  79/15/0569 4.7 (H)   Calcium (mg/dL)  Date Value  79/48/0165 8.1 (L)   Albumin (g/dL)  Date Value  53/74/8270 4.1    Assessment/Plan: No replacement warranted.   Will recheck electrolytes with am labs.   Will replace for goal potassium ~4, goal magnesium ~ 2, and goal phosphorus > 2.5.   Pharmacy will continue to monitor and adjust per consult.   Enijah Furr L 02/06/2019 5:12 PM

## 2019-02-07 LAB — CBC
HCT: 42.9 % (ref 39.0–52.0)
Hemoglobin: 14 g/dL (ref 13.0–17.0)
MCH: 30.6 pg (ref 26.0–34.0)
MCHC: 32.6 g/dL (ref 30.0–36.0)
MCV: 93.9 fL (ref 80.0–100.0)
Platelets: 229 10*3/uL (ref 150–400)
RBC: 4.57 MIL/uL (ref 4.22–5.81)
RDW: 13.4 % (ref 11.5–15.5)
WBC: 14 10*3/uL — ABNORMAL HIGH (ref 4.0–10.5)
nRBC: 0 % (ref 0.0–0.2)

## 2019-02-07 LAB — NOVEL CORONAVIRUS, NAA (HOSP ORDER, SEND-OUT TO REF LAB; TAT 18-24 HRS): SARS-CoV-2, NAA: NOT DETECTED

## 2019-02-07 LAB — BASIC METABOLIC PANEL
Anion gap: 7 (ref 5–15)
BUN: 6 mg/dL (ref 6–20)
CO2: 22 mmol/L (ref 22–32)
CREATININE: 0.83 mg/dL (ref 0.61–1.24)
Calcium: 8.2 mg/dL — ABNORMAL LOW (ref 8.9–10.3)
Chloride: 106 mmol/L (ref 98–111)
GFR calc Af Amer: >60 mL/min (ref >60)
GFR calc non Af Amer: >60 mL/min (ref >60)
Glucose, Bld: 118 mg/dL — ABNORMAL HIGH (ref 70–99)
Potassium: 3.8 mmol/L (ref 3.5–5.1)
Sodium: 135 mmol/L (ref 135–145)

## 2019-02-07 LAB — MAGNESIUM: Magnesium: 2.1 mg/dL (ref 1.7–2.4)

## 2019-02-07 LAB — HIV ANTIBODY (ROUTINE TESTING W REFLEX): HIV Screen 4th Generation wRfx: NONREACTIVE

## 2019-02-07 LAB — PHOSPHORUS: Phosphorus: 2.9 mg/dL (ref 2.5–4.6)

## 2019-02-07 MED ORDER — QUETIAPINE FUMARATE 25 MG PO TABS: 25.0000 mg | ORAL_TABLET | Freq: Three times a day (TID) | ORAL | 0 refills | Status: DC | PRN

## 2019-02-07 MED ORDER — CLONIDINE HCL 0.1 MG PO TABS: 0.1000 mg | ORAL_TABLET | Freq: Three times a day (TID) | ORAL | 0 refills | Status: DC

## 2019-02-07 MED ORDER — MIRTAZAPINE 7.5 MG PO TABS: 7.5000 mg | ORAL_TABLET | Freq: Every day | ORAL | 0 refills | Status: DC

## 2019-02-07 MED ORDER — ADULT MULTIVITAMIN W/MINERALS CH: 1.0000 | ORAL_TABLET | Freq: Every day | ORAL | 0 refills | Status: DC

## 2019-02-07 MED ORDER — AMOXICILLIN-POT CLAVULANATE 875-125 MG PO TABS: 1.0000 | ORAL_TABLET | Freq: Two times a day (BID) | ORAL | 0 refills | Status: DC

## 2019-02-07 NOTE — TOC Transition Note (Signed)
Transition of Care [facility]) - CM/SW Discharge Note   Patient Details  Name: [name] MRN: [id number] Date of Birth: [birth year]  [facility]) CM/SW Contact:  Chapman Fitch, RN Phone Number: 02/07/2019, 2:49 PM   Clinical Narrative:     Patient discharged home today.  Patient states that he will be discharging to his mothers home.  Patient states that he does not have a PCP, and is uninsured. Patient to discharge on catapress ($8.72), Augmentin ($13.22), and Remeron ($27.14).  Patient provided with goodrx coupons for [facility].  Patient confirms he will be able to purchase medication at discharge. Patient provided with application to [facility]ication [facility] , [facility] , "[employer]:  Your Guide to Free and [employer] in [place]"  Booklet   Licensed conveyancer attempted to make new patient appointment at [facility], however they are not currently accepting new patients due to the COVID pandemic   Final next level of care: Home/Self Care Barriers to Discharge: Barriers Resolved   Patient Goals and CMS Choice        Discharge Placement                       Discharge Plan and Services   Discharge Planning Services: Indigent Health Clinic, Medication Assistance                      Social Determinants of Health (SDOH) Interventions     Readmission Risk Interventions No flowsheet data found.

## 2019-02-07 NOTE — Progress Notes (Signed)
Pt transferred from CCU, VSS, no complaints of pain, SR on telemetry. Pt states he will leave if his seroquel dose is not put back to home dose. I will continue to assess.

## 2019-02-07 NOTE — Progress Notes (Signed)
Notified by RN pt asking to speak with me about his psychiatric medications.  Upon arrival at bedside the pt asked why his seroquel dose was decreased from 300 mg qhs to 25 mg every 8 hrs as needed and why his outpatient thorazine had not been ordered.  I explained to the pt psychiatrist Dr. Viviano Simas decreased seroquel dose and started mirtazapine 7.5 mg qhs for depression, appetite stimulation and to improve sleep.  The pt stated if his seroquel dosage was not increased back to 300 mg qhs he would leave against medical advice.  I informed the pt I will follow the psychiatrist recommendations and I would not increase his seroquel dose.  I also told the pt we can give him an as needed dose of ativan for agitation, which will likely help his insomnia however he refused.  I explained to the pt he was admitted following an accidental fentanyl overdose, therefore medications needed to be adjusted for his safety.  I also explained we are currently waiting on his coronovirus lab results to come back and he could speak with psychiatry in the morning to get clarification regarding his psychiatric medication doses. I reiterated if he leaves the hospital he would be leaving against medical advice and would be putting his health at risk and could be at risk for sudden death.  He is alert, oriented, and not involuntarily committed.  He states he understands the consequences if he were to leave against medical advice and all questions answered.  Sonda Rumble, AGNP  Pulmonary/Critical Care Pager [phone] (please enter 7 digits) PCCM Consult Pager [phone] (please enter 7 digits)

## 2019-02-07 NOTE — Discharge Summary (Signed)
[facility] - [place] at [facility]   PATIENT NAME: [name]    MR#:  [id number]  DATE OF BIRTH:  1981/09/20  DATE OF ADMISSION:  02/04/2019 ADMITTING PHYSICIAN: Altamese Dilling, MD  DATE OF DISCHARGE: No discharge date for patient encounter.  PRIMARY CARE PHYSICIAN: System, Pcp Not In    ADMISSION DIAGNOSIS:  Hypoxemia [R09.02] Accidental drug overdose, initial encounter [T50.901A] Aspiration pneumonia, unspecified aspiration pneumonia type, unspecified laterality, unspecified part of lung (HCC) [J69.0]  DISCHARGE DIAGNOSIS:  Principal Problem:   Respiratory failure with hypoxia (HCC) Active Problems:   Aspiration pneumonia of both lower lobes due to gastric secretions (HCC)   Drug overdose, accidental or unintentional, initial encounter   Adjustment disorder with mixed anxiety and depressed mood   SECONDARY DIAGNOSIS:  History reviewed. No pertinent past medical history.  HOSPITAL COURSE:  This is a 38 year old African-American male with history of opioid drug abuse presented with fentanyl drug overdose was found to be in hypoxic respiratory failure.  *Acute hypoxic respiratory failure Secondary to drug overdose and possible aspiration pneumonia Resolved Weaned of O2  *Acute probable aspiration pneumonia Treated on our pneumonia protocol, Unasyn IV while in house  *Acute adjustment d/o w/ anxiety/depressed mood Controlled on current regiment as directed by Pyschiatry  *Drug overdose, Accidental Substance abuse counseling given while in house  *Concern for Covid 19 Testing pending at discharge Will wear mask and do self isolation until results known   DISCHARGE CONDITIONS:   stable  CONSULTS OBTAINED:  Treatment Team:  Mariel Craft, MD  DRUG ALLERGIES:  Not on File  DISCHARGE MEDICATIONS:   Allergies as of 02/07/2019   Not on File     Medication List    STOP taking these medications   cephALEXin 500 MG  capsule Commonly known as:  KEFLEX     TAKE these medications   amoxicillin-clavulanate 875-125 MG tablet Commonly known as:  Augmentin Take 1 tablet by mouth 2 (two) times daily.   cloNIDine 0.1 MG tablet Commonly known as:  CATAPRES Take 1 tablet (0.1 mg total) by mouth 3 (three) times daily.   mirtazapine 7.5 MG tablet Commonly known as:  REMERON Take 1 tablet (7.5 mg total) by mouth at bedtime.   multivitamin with minerals Tabs tablet Take 1 tablet by mouth daily. Start taking on:  February 08, 2019   QUEtiapine 25 MG tablet Commonly known as:  SEROQUEL Take 1 tablet (25 mg total) by mouth every 8 (eight) hours as needed (agitation). What changed:    medication strength  how much to take  when to take this  reasons to take this        DISCHARGE INSTRUCTIONS:    If you experience worsening of your admission symptoms, develop shortness of breath, life threatening emergency, suicidal or homicidal thoughts you must seek medical attention immediately by calling 911 or calling your MD immediately  if symptoms less severe.  You Must read complete instructions/literature along with all the possible adverse reactions/side effects for all the Medicines you take and that have been prescribed to you. Take any new Medicines after you have completely understood and accept all the possible adverse reactions/side effects.   Please note  You were cared for by a hospitalist during your hospital stay. If you have any questions about your discharge medications or the care you received while you were in the hospital after you are discharged, you can call the unit and asked to speak with the hospitalist on call  if the hospitalist that took care of you is not available. Once you are discharged, your primary care physician will handle any further medical issues. Please note that NO REFILLS for any discharge medications will be authorized once you are discharged, as it is imperative that you  return to your primary care physician (or establish a relationship with a primary care physician if you do not have one) for your aftercare needs so that they can reassess your need for medications and monitor your lab values.    Today   CHIEF COMPLAINT:   Chief Complaint  Patient presents with  . Drug Overdose    HISTORY OF PRESENT ILLNESS:  38 y.o. male with a known history of substance abuse, opiate abuse was brought to the emergency room by EMS from home after patient had overdosed on fentanyl.  Patient reported that he injected unknown amount of fentanyl.  Patient was initially unresponsive with no pulse.  Patient was having pinpoint pupils and agonal respiration so he was given 4 mg of Narcan intranasally by EMS and placed on nonrebreather due to hypoxia with oxygen saturation 85% on room air and brought to the emergency room.  Patient reports that he just finished rehab for narcotics a week ago.  Patient with no history of fever or chest pain.  On arrival to emergency room he was placed on BiPAP.  Chest x-ray shows heterogenous lower lung opacities could be pulmonary edema pneumonia or aspiration.  White count is 19,000.  Troponin is normal.  Potassium is 3.2.  Patient was given IV Zosyn and breathing treatment.  Patient was referred to hospitalist service for admission.  Patient is currently alert awake tolerating BiPAP very well.  Oxygen saturation 95%.   VITAL SIGNS:  Blood pressure (!) 137/98, pulse (!) 112, temperature 98.5 F (36.9 C), temperature source Oral, resp. rate 18, height 6' (1.829 m), weight 116.6 kg, SpO2 91 %.  I/O:    Intake/Output Summary (Last 24 hours) at 02/07/2019 1155 Last data filed at 02/07/2019 0835 Gross per 24 hour  Intake 525.14 ml  Output 1250 ml  Net -724.86 ml    PHYSICAL EXAMINATION:  GENERAL:  38 y.o.-year-old patient lying in the bed with no acute distress.  EYES: Pupils equal, round, reactive to light and accommodation. No scleral  icterus. Extraocular muscles intact.  HEENT: Head atraumatic, normocephalic. Oropharynx and nasopharynx clear.  NECK:  Supple, no jugular venous distention. No thyroid enlargement, no tenderness.  LUNGS: Normal breath sounds bilaterally, no wheezing, rales,rhonchi or crepitation. No use of accessory muscles of respiration.  CARDIOVASCULAR: S1, S2 normal. No murmurs, rubs, or gallops.  ABDOMEN: Soft, non-tender, non-distended. Bowel sounds present. No organomegaly or mass.  EXTREMITIES: No pedal edema, cyanosis, or clubbing.  NEUROLOGIC: Cranial nerves II through XII are intact. Muscle strength 5/5 in all extremities. Sensation intact. Gait not checked.  PSYCHIATRIC: The patient is alert and oriented x 3.  SKIN: No obvious rash, lesion, or ulcer.   DATA REVIEW:   CBC Recent Labs  Lab 02/07/19 0537  WBC 14.0*  HGB 14.0  HCT 42.9  PLT 229    Chemistries  Recent Labs  Lab 02/04/19 2255  02/07/19 0537  NA 135   < > 135  K 3.2*   < > 3.8  CL 100   < > 106  CO2 22   < > 22  GLUCOSE 237*   < > 118*  BUN 7   < > 6  CREATININE 1.06   < >  0.83  CALCIUM 8.2*   < > 8.2*  MG  --    < > 2.1  AST 24  --   --   ALT 14  --   --   ALKPHOS 75  --   --   BILITOT 0.6  --   --    < > = values in this interval not displayed.    Cardiac Enzymes Recent Labs  Lab 02/05/19 0457  TROPONINI <0.03    Microbiology Results  Results for orders placed or performed during the hospital encounter of 02/04/19  MRSA PCR Screening     Status: None   Collection Time: 02/05/19  1:48 AM  Result Value Ref Range Status   MRSA by PCR NEGATIVE NEGATIVE Final    Comment:        The GeneXpert MRSA Assay (FDA approved for NASAL specimens only), is one component of a comprehensive MRSA colonization surveillance program. It is not intended to diagnose MRSA infection nor to guide or monitor treatment for MRSA infections. Performed at [facility], [address] [place], [place] [address]    Respiratory Panel by PCR     Status: None   Collection Time: 02/05/19  4:15 AM  Result Value Ref Range Status   Adenovirus NOT DETECTED NOT DETECTED Final   Coronavirus 229E NOT DETECTED NOT DETECTED Final    Comment: (NOTE) The Coronavirus on the Respiratory Panel, DOES NOT test for the novel  Coronavirus (2019 nCoV)    Coronavirus HKU1 NOT DETECTED NOT DETECTED Final   Coronavirus NL63 NOT DETECTED NOT DETECTED Final   Coronavirus OC43 NOT DETECTED NOT DETECTED Final   Metapneumovirus NOT DETECTED NOT DETECTED Final   Rhinovirus / Enterovirus NOT DETECTED NOT DETECTED Final   Influenza A NOT DETECTED NOT DETECTED Final   Influenza B NOT DETECTED NOT DETECTED Final   Parainfluenza Virus 1 NOT DETECTED NOT DETECTED Final   Parainfluenza Virus 2 NOT DETECTED NOT DETECTED Final   Parainfluenza Virus 3 NOT DETECTED NOT DETECTED Final   Parainfluenza Virus 4 NOT DETECTED NOT DETECTED Final   Respiratory Syncytial Virus NOT DETECTED NOT DETECTED Final   Bordetella pertussis NOT DETECTED NOT DETECTED Final   Chlamydophila pneumoniae NOT DETECTED NOT DETECTED Final   Mycoplasma pneumoniae NOT DETECTED NOT DETECTED Final    Comment: Performed at [facility], [address] [place], [place] [address]  Culture, blood (Routine X 2) w Reflex to ID Panel     Status: None (Preliminary result)   Collection Time: 02/05/19 10:19 AM  Result Value Ref Range Status   Specimen Description BLOOD L WRIST  Final   Special Requests   Final    BOTTLES DRAWN AEROBIC AND ANAEROBIC Blood Culture adequate volume   Culture   Final    NO GROWTH 2 DAYS Performed at [facility], [address] [place], [place] [address]    Report Status PENDING  Incomplete  Culture, blood (Routine X 2) w Reflex to ID Panel     Status: None (Preliminary result)   Collection Time: 02/05/19 10:46 AM  Result Value Ref Range Status   Specimen Description BLOOD L HAND  Final   Special Requests   Final     BOTTLES DRAWN AEROBIC AND ANAEROBIC Blood Culture adequate volume   Culture   Final    NO GROWTH 2 DAYS Performed at [facility], [address], [place], [place] [address]    Report Status PENDING  Incomplete  RADIOLOGY:  Dg Chest Port 1 View  Result Date: 02/06/2019 CLINICAL DATA:  38 year old male with a history of shortness of breath EXAM: PORTABLE CHEST 1 VIEW COMPARISON:  02/04/2019 FINDINGS: Cardiomediastinal silhouette unchanged in size and contour. Lung volumes remain low. Reticulonodular opacities of the bilateral lower lungs. No pneumothorax or pleural effusion. No interlobular septal thickening. No displaced fracture. IMPRESSION: Reticulonodular opacities, potentially multifocal infection and/or sequela of aspiration. Electronically Signed   By: Gilmer Mor D.O.   On: 02/06/2019 09:31    EKG:   Orders placed or performed during the hospital encounter of 02/04/19  . ED EKG  . ED EKG  . EKG 12-Lead  . EKG 12-Lead  . EKG 12-Lead  . EKG 12-Lead      Management plans discussed with the patient, family and they are in agreement.  CODE STATUS:     Code Status Orders  (From admission, onward)         Start     Ordered   02/05/19 0142  Full code  Continuous     02/05/19 0141        Code Status History    This patient has a current code status but no historical code status.      TOTAL TIME TAKING CARE OF THIS PATIENT: 35 minutes.    Evelena Asa Hank Walling M.D on 02/07/2019 at 11:55 AM  Between 7am to 6pm - Pager - [phone]  After 6pm go to [email or web address] - password [email or web address]  [facility] [place] [facility]  Office  [phone]  CC: Primary care physician; System, Pcp Not In   Note: This dictation was prepared with Dragon dictation along with smaller phrase technology. Any transcriptional errors that result from this process are unintentional.

## 2019-02-10 LAB — CULTURE, BLOOD (ROUTINE X 2)
Culture: NO GROWTH
Culture: NO GROWTH
Special Requests: ADEQUATE
Special Requests: ADEQUATE

## 2019-03-01 DIAGNOSIS — F1190 Opioid use, unspecified, uncomplicated: Secondary | ICD-10-CM | POA: Insufficient documentation

## 2019-03-01 DIAGNOSIS — F1120 Opioid dependence, uncomplicated: Secondary | ICD-10-CM | POA: Insufficient documentation

## 2019-03-01 DIAGNOSIS — R7303 Prediabetes: Secondary | ICD-10-CM | POA: Insufficient documentation

## 2019-03-10 DIAGNOSIS — F1110 Opioid abuse, uncomplicated: Secondary | ICD-10-CM

## 2019-03-10 DIAGNOSIS — R0600 Dyspnea, unspecified: Secondary | ICD-10-CM | POA: Insufficient documentation

## 2019-03-10 HISTORY — DX: Opioid abuse, uncomplicated: F11.10

## 2019-03-13 ENCOUNTER — Encounter: Payer: Self-pay | Admitting: Emergency Medicine

## 2019-03-13 ENCOUNTER — Other Ambulatory Visit: Payer: Self-pay

## 2019-03-13 DIAGNOSIS — F1110 Opioid abuse, uncomplicated: Secondary | ICD-10-CM | POA: Insufficient documentation

## 2019-03-13 DIAGNOSIS — Z79899 Other long term (current) drug therapy: Secondary | ICD-10-CM | POA: Insufficient documentation

## 2019-03-13 DIAGNOSIS — R000 Tachycardia, unspecified: Secondary | ICD-10-CM | POA: Insufficient documentation

## 2019-03-13 LAB — COMPREHENSIVE METABOLIC PANEL
ALT: 13 U/L (ref 0–44)
AST: 14 U/L — ABNORMAL LOW (ref 15–41)
Albumin: 4.2 g/dL (ref 3.5–5.0)
Alkaline Phosphatase: 86 U/L (ref 38–126)
Anion gap: 13 (ref 5–15)
BUN: 11 mg/dL (ref 6–20)
CO2: 26 mmol/L (ref 22–32)
Calcium: 9.1 mg/dL (ref 8.9–10.3)
Chloride: 97 mmol/L — ABNORMAL LOW (ref 98–111)
Creatinine, Ser: 1 mg/dL (ref 0.61–1.24)
GFR calc Af Amer: >60 mL/min (ref >60)
GFR calc non Af Amer: >60 mL/min (ref >60)
Glucose, Bld: 192 mg/dL — ABNORMAL HIGH (ref 70–99)
Potassium: 3.6 mmol/L (ref 3.5–5.1)
Sodium: 136 mmol/L (ref 135–145)
Total Bilirubin: 0.5 mg/dL (ref 0.3–1.2)
Total Protein: 8.2 g/dL — ABNORMAL HIGH (ref 6.5–8.1)

## 2019-03-13 LAB — CBC WITH DIFFERENTIAL/PLATELET
Abs Immature Granulocytes: 0.03 10*3/uL (ref 0.00–0.07)
Basophils Absolute: 0.1 10*3/uL (ref 0.0–0.1)
Basophils Relative: 0 %
Eosinophils Absolute: 0.1 10*3/uL (ref 0.0–0.5)
Eosinophils Relative: 1 %
HCT: 46.3 % (ref 39.0–52.0)
Hemoglobin: 15.2 g/dL (ref 13.0–17.0)
Immature Granulocytes: 0 %
Lymphocytes Relative: 19 %
Lymphs Abs: 2.4 10*3/uL (ref 0.7–4.0)
MCH: 30.5 pg (ref 26.0–34.0)
MCHC: 32.8 g/dL (ref 30.0–36.0)
MCV: 92.8 fL (ref 80.0–100.0)
Monocytes Absolute: 0.6 10*3/uL (ref 0.1–1.0)
Monocytes Relative: 5 %
Neutro Abs: 9.7 10*3/uL — ABNORMAL HIGH (ref 1.7–7.7)
Neutrophils Relative %: 75 %
Platelets: 339 10*3/uL (ref 150–400)
RBC: 4.99 MIL/uL (ref 4.22–5.81)
RDW: 12.6 % (ref 11.5–15.5)
WBC: 12.9 10*3/uL — ABNORMAL HIGH (ref 4.0–10.5)
nRBC: 0 % (ref 0.0–0.2)

## 2019-03-13 LAB — URINE DRUG SCREEN, QUALITATIVE (ARMC ONLY)
Amphetamines, Ur Screen: NOT DETECTED
Barbiturates, Ur Screen: NOT DETECTED
Benzodiazepine, Ur Scrn: NOT DETECTED
Cannabinoid 50 Ng, Ur ~~LOC~~: NOT DETECTED
Cocaine Metabolite,Ur ~~LOC~~: NOT DETECTED
MDMA (Ecstasy)Ur Screen: NOT DETECTED
Methadone Scn, Ur: NOT DETECTED
Opiate, Ur Screen: NOT DETECTED
Phencyclidine (PCP) Ur S: NOT DETECTED
Tricyclic, Ur Screen: NOT DETECTED

## 2019-03-13 LAB — URINALYSIS, COMPLETE (UACMP) WITH MICROSCOPIC
Bacteria, UA: NONE SEEN
Bilirubin Urine: NEGATIVE
Glucose, UA: NEGATIVE mg/dL
Hgb urine dipstick: NEGATIVE
Ketones, ur: 5 mg/dL — AB
Leukocytes,Ua: NEGATIVE
Nitrite: NEGATIVE
Protein, ur: NEGATIVE mg/dL
Specific Gravity, Urine: 1.02 (ref 1.005–1.030)
pH: 7 (ref 5.0–8.0)

## 2019-03-13 LAB — TROPONIN I: Troponin I: <0.03 ng/mL (ref <0.03)

## 2019-03-13 NOTE — ED Triage Notes (Signed)
Patient ambulatory to triage with steady gait, without difficulty or distress noted; pt reports tachycardia x hr; denies hx or accomp symptoms

## 2019-03-14 ENCOUNTER — Emergency Department
Admission: EM | Admit: 2019-03-14 | Discharge: 2019-03-14 | Disposition: A | Discharge status: Home / Self Care | Payer: Self-pay | Attending: Emergency Medicine | Admitting: Emergency Medicine

## 2019-03-14 ENCOUNTER — Encounter: Payer: Self-pay | Admitting: Emergency Medicine

## 2019-03-14 DIAGNOSIS — F1110 Opioid abuse, uncomplicated: Secondary | ICD-10-CM

## 2019-03-14 DIAGNOSIS — R000 Tachycardia, unspecified: Secondary | ICD-10-CM

## 2019-03-14 HISTORY — DX: Opioid abuse, uncomplicated: F11.10

## 2019-03-14 LAB — BLOOD GAS, VENOUS
Acid-Base Excess: 4.4 mmol/L — ABNORMAL HIGH (ref 0.0–2.0)
Bicarbonate: 30.8 mmol/L — ABNORMAL HIGH (ref 20.0–28.0)
O2 Saturation: 70.9 %
Patient temperature: 37
pCO2, Ven: 52 mmHg (ref 44.0–60.0)
pH, Ven: 7.38 (ref 7.250–7.430)
pO2, Ven: 38 mmHg (ref 32.0–45.0)

## 2019-03-14 LAB — TSH: TSH: 1.435 u[IU]/mL (ref 0.350–4.500)

## 2019-03-14 LAB — CK: Total CK: 104 U/L (ref 49–397)

## 2019-03-14 LAB — LACTIC ACID, PLASMA: Lactic Acid, Venous: 0.9 mmol/L (ref 0.5–1.9)

## 2019-03-14 LAB — FIBRIN DERIVATIVES D-DIMER (ARMC ONLY): Fibrin derivatives D-dimer (ARMC): 366.81 ng/mL (FEU) (ref 0.00–499.00)

## 2019-03-14 LAB — T4, FREE: Free T4: 0.96 ng/dL (ref 0.82–1.77)

## 2019-03-14 MED ORDER — SODIUM CHLORIDE 0.9 % IV BOLUS
1000.0000 mL | Freq: Once | INTRAVENOUS | Status: AC
  Administered 2019-03-14: 1000 mL via INTRAVENOUS

## 2019-03-14 NOTE — Discharge Instructions (Addendum)
Your workup in the Emergency Department today was reassuring.  We did not find any specific abnormalities.  It is possible that you were a little bit dehydrated after working out in the yard, but it is also possible that you are having some withdrawal because you abuse fentanyl.  We recommend you drink plenty of fluids, take your regular medications and/or any new ones prescribed today, and follow up with the doctor(s) listed in these documents as recommended.  Avoid any drugs and alcohol.  Return to the Emergency Department if you develop new or worsening symptoms that concern you.

## 2019-03-14 NOTE — ED Provider Notes (Signed)
[facility] Emergency Department Provider Note  ____________________________________________   First MD Initiated Contact with Patient 03/14/19 0155     (approximate)  I have reviewed the triage vital signs and the nursing notes.   HISTORY  Chief Complaint Tachycardia    HPI [name] is a 38 y.o. male    with medical history as listed below who presents for evaluation of rapid heart rate.  He says he became aware of it tonight while he was taking a shower.  There was not one particular moment where it started acutely.  He felt his heart was beating fast so he came in.  He has had no other acute symptoms.  He has been talking with his doctor at [place] because he said for the last couple of weeks he sometimes feels short of breath but it has not acutely changed today.  He has not noticed a rapid heart rate in the past.  He denies chest pain, nausea, vomiting, cough, nasal congestion, and fever/chills.  He admits to daily use of IV fentanyl and has had a prior accidental overdose.  He last use earlier today.  He also said he was outside mowing the lawn with a push mower today with in the heat which she has not done for a long time.  He says he has been eating and drinking okay but he feels a little bit dry right now.  He describes the rapid heart rate as severe.        Past Medical History:  Diagnosis Date  . Opioid abuse (HCC) 03/2019   on-going daily use of IV Fentanyl    Patient Active Problem List   Diagnosis Date Noted  . Adjustment disorder with mixed anxiety and depressed mood 02/06/2019  . Respiratory failure with hypoxia (HCC) 02/05/2019  . Aspiration pneumonia of both lower lobes due to gastric secretions (HCC) 02/05/2019  . Drug overdose, accidental or unintentional, initial encounter 02/05/2019    History reviewed. No pertinent surgical history.  Prior to Admission medications   Medication Sig Start Date End Date Taking? Authorizing  Provider  amoxicillin-clavulanate (AUGMENTIN) 875-125 MG tablet Take 1 tablet by mouth 2 (two) times daily. 02/07/19   Salary, Evelena Asa, MD  cloNIDine (CATAPRES) 0.1 MG tablet Take 1 tablet (0.1 mg total) by mouth 3 (three) times daily. 02/07/19   Salary, Evelena Asa, MD  mirtazapine (REMERON) 7.5 MG tablet Take 1 tablet (7.5 mg total) by mouth at bedtime. 02/07/19   Salary, Evelena Asa, MD  Multiple Vitamin (MULTIVITAMIN WITH MINERALS) TABS tablet Take 1 tablet by mouth daily. 02/08/19   Salary, Evelena Asa, MD  QUEtiapine (SEROQUEL) 25 MG tablet Take 1 tablet (25 mg total) by mouth every 8 (eight) hours as needed (agitation). 02/07/19   Salary, Evelena Asa, MD    Allergies Patient has no known allergies.  History reviewed. No pertinent family history.  Social History Social History   Tobacco Use  . Smoking status: Never Smoker  . Smokeless tobacco: Never Used  Substance Use Topics  . Alcohol use: No  . Drug use: Yes    Types: IV    Comment: Fentanyl    Review of Systems Constitutional: No fever/chills Eyes: No visual changes. ENT: No sore throat. Cardiovascular: Rapid heart rate.  Denies chest pain. Respiratory: Intermittent shortness of breath for the last couple of weeks, not acutely worse tonight. Gastrointestinal: No abdominal pain.  No nausea, no vomiting.  No diarrhea.  No constipation. Genitourinary: Negative for dysuria.  Musculoskeletal: Negative for neck pain.  Negative for back pain. Integumentary: Negative for rash. Neurological: Negative for headaches, focal weakness or numbness. Psych: ongoing Fentanyl abuse  ____________________________________________   PHYSICAL EXAM:  VITAL SIGNS: ED Triage Vitals  Enc Vitals Group     BP 03/13/19 2309 126/90     Pulse Rate 03/13/19 2309 (!) 151     Resp 03/13/19 2309 20     Temp 03/13/19 2309 98.1 F (36.7 C)     Temp Source 03/13/19 2309 Oral     SpO2 03/13/19 2309 99 %     Weight 03/13/19 2304 113.4 kg (250 lb)      Height 03/13/19 2304 1.854 m ( )     Head Circumference --      Peak Flow --      Pain Score 03/13/19 2304 0     Pain Loc --      Pain Edu? --      Excl. in [place]? --     Constitutional: Alert and oriented. Well appearing and in no acute distress. Eyes: Conjunctivae are normal.  Head: Atraumatic. Nose: No congestion/rhinnorhea. Mouth/Throat: Mucous membranes are tacky. Neck: No stridor.  No meningeal signs.   Cardiovascular: Moderate tachycardia, regular rhythm. Good peripheral circulation. Grossly normal heart sounds. Respiratory: Normal respiratory effort.  No retractions. No audible wheezing. Gastrointestinal: Soft and nontender. No distention.  Musculoskeletal: No lower extremity tenderness nor edema. No gross deformities of extremities. Neurologic:  Normal speech and language. No gross focal neurologic deficits are appreciated.  Skin:  Skin is warm, dry and intact. No rash noted. Psychiatric: Mood and affect are normal. Speech and behavior are normal.  ____________________________________________   LABS (all labs ordered are listed, but only abnormal results are displayed)  Labs Reviewed  CBC WITH DIFFERENTIAL/PLATELET - Abnormal; Notable for the following components:      Result Value   WBC 12.9 (*)    Neutro Abs 9.7 (*)    All other components within normal limits  COMPREHENSIVE METABOLIC PANEL - Abnormal; Notable for the following components:   Chloride 97 (*)    Glucose, Bld 192 (*)    Total Protein 8.2 (*)    AST 14 (*)    All other components within normal limits  URINALYSIS, COMPLETE (UACMP) WITH MICROSCOPIC - Abnormal; Notable for the following components:   Color, Urine YELLOW (*)    APPearance CLEAR (*)    Ketones, ur 5 (*)    All other components within normal limits  BLOOD GAS, VENOUS - Abnormal; Notable for the following components:   Bicarbonate 30.8 (*)    Acid-Base Excess 4.4 (*)    All other components within normal limits  TROPONIN I  TSH   URINE DRUG SCREEN, QUALITATIVE ([facility] ONLY)  CK  T4, FREE  LACTIC ACID, PLASMA  FIBRIN DERIVATIVES D-DIMER ([facility] ONLY)  LACTIC ACID, PLASMA   ____________________________________________  EKG  ED ECG REPORT I, Loleta Rose, the attending physician, personally viewed and interpreted this ECG.  Date: 03/13/2019 EKG Time: 23:07 Rate: 146 Rhythm: sinus tachycardia QRS Axis: normal Intervals: normal ST/T Wave abnormalities: Non-specific ST segment / T-wave changes, but no clear evidence of acute ischemia. Narrative Interpretation: no definitive evidence of acute ischemia; does not meet STEMI criteria.   ____________________________________________  RADIOLOGY   ED MD interpretation:  No indication for imaging  Official radiology report(s): No results found.  ____________________________________________   PROCEDURES   Procedure(s) performed (including Critical Care):  Procedures   ____________________________________________  INITIAL IMPRESSION / MDM / ASSESSMENT AND PLAN / ED COURSE  As part of my medical decision making, I reviewed the following data within the electronic MEDICAL RECORD NUMBER Nursing notes reviewed and incorporated, Labs reviewed , EKG interpreted , Old chart reviewed, Notes from prior ED visits and [place] Controlled Substance Database      *Lowella CurbJeremy Seif was evaluated in Emergency Department on 03/14/2019 for the symptoms described in the history of present illness. He was evaluated in the context of the global COVID-19 pandemic, which necessitated consideration that the patient might be at risk for infection with the SARS-CoV-2 virus that causes COVID-19. Institutional protocols and algorithms that pertain to the evaluation of patients at risk for COVID-19 are in a state of rapid change based on information released by regulatory bodies including the CDC and federal and state organizations. These policies and algorithms were followed during the patient's care  in the ED.*  Differential diagnosis includes, but is not limited to, volume depletion/dehydration, drug side effect such as stimulant use (which the patient denies), opioid withdrawal, pulmonary embolism.  The patient was honest with me about his ongoing opioid use and the fact that he has not used any since this morning suggest to me he may be having some opioid withdrawal.  After we talked about it a little bit he said he has been somewhat nauseated today.  On top of this he was using a push more out in the heat today and his mouth is obviously tacky and he does not seem well hydrated.  I am giving him a liter of fluids.  His lab work is notable for a mild leukocytosis of 12.9 but I do not think this represents acute infection.  He is young is otherwise healthy and afebrile.  He has had no signs or symptoms of COVID-19.  Free T4, TSH, troponin are all within normal limits.  VBG, lactic acid, d-dimer are all pending.  I think the patient is at low risk of PE but given his tachycardia and episodes of shortness of breath it is reasonable to try to rule out with a d-dimer.  He agrees with the plan.  Clinical Course as of Mar 13 440  Tue Mar 14, 2019  0439 Reassuring d-dimer  Fibrin derivatives D-dimer [facility]): 366.81 [CF]  0439 Normal CK  CK Total: 104 [CF]  0439 Patient's medical work-up has been very reassuring.  His heart rate is come down to the 80s after 1 L of fluid.  I will encourage oral intake, staying away from opioids, and close outpatient follow-up.  I gave my usual customary return precautions.   [CF]    Clinical Course User Index [CF] Loleta RoseForbach, Meerab Maselli, MD     ____________________________________________  FINAL CLINICAL IMPRESSION(S) / ED DIAGNOSES  Final diagnoses:  Tachycardia  Opioid abuse (HCC)     MEDICATIONS GIVEN DURING THIS VISIT:  Medications  sodium chloride 0.9 % bolus 1,000 mL (1,000 mLs Intravenous New Bag/Given 03/14/19 0307)     ED Discharge Orders    None        Note:  This document was prepared using Dragon voice recognition software and may include unintentional dictation errors.   Loleta RoseForbach, Garrett Bowring, MD 03/14/19 [phone]

## 2019-03-14 NOTE — ED Notes (Signed)
Lab called that d dimer hemolyzed. They will come draw recollect

## 2019-05-16 DIAGNOSIS — F1910 Other psychoactive substance abuse, uncomplicated: Secondary | ICD-10-CM | POA: Insufficient documentation

## 2019-05-16 DIAGNOSIS — Z87898 Personal history of other specified conditions: Secondary | ICD-10-CM | POA: Insufficient documentation

## 2019-05-18 DIAGNOSIS — I10 Essential (primary) hypertension: Secondary | ICD-10-CM | POA: Insufficient documentation

## 2019-07-30 IMAGING — DX PORTABLE CHEST - 1 VIEW
1 series · 1 of 1 positions shown · non-contrast
Comparison: 02/04/2019

CLINICAL DATA: 37-year-old male with a history of shortness of
breath

EXAM:
PORTABLE CHEST 1 VIEW

[chest ap]
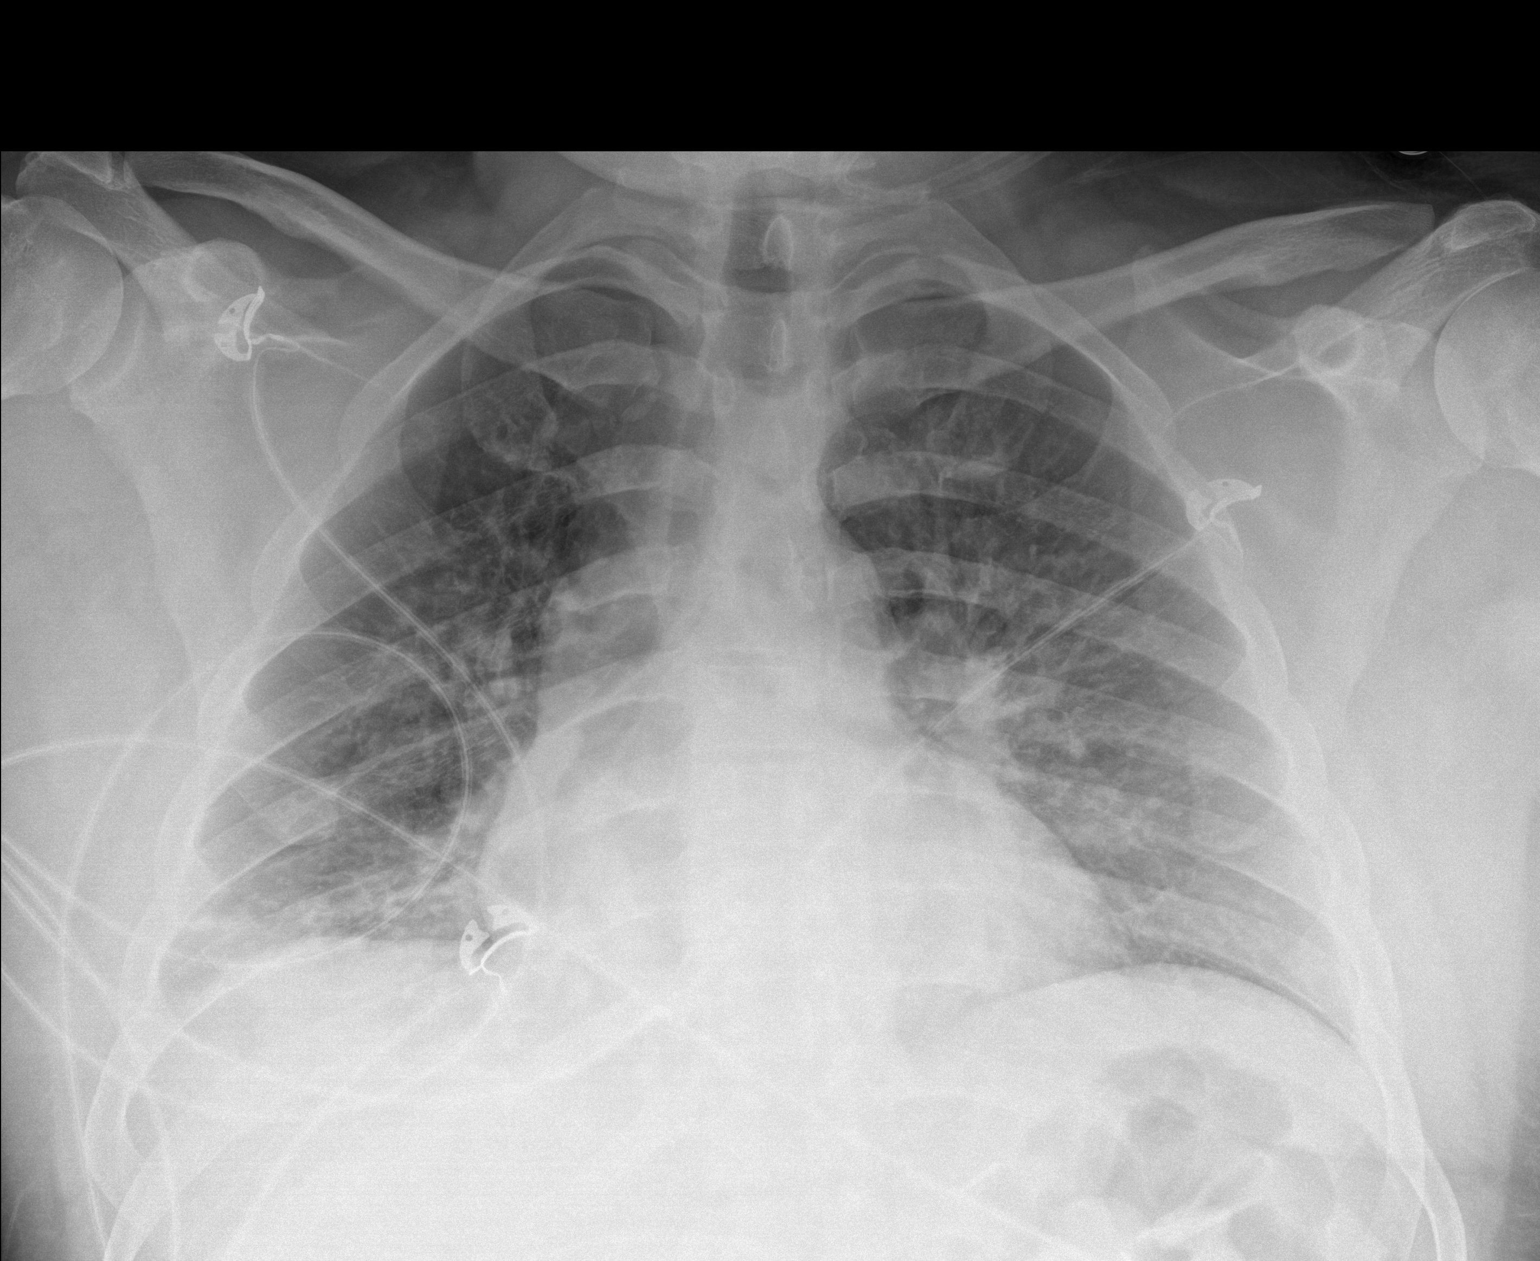

[1 of 1 positions shown; findings below may reference images not displayed]

FINDINGS: Cardiomediastinal silhouette unchanged in size and contour.

Lung volumes remain low. Reticulonodular opacities of the bilateral
lower lungs. No pneumothorax or pleural effusion. No interlobular
septal thickening.

No displaced fracture.
IMPRESSION: Reticulonodular opacities, potentially multifocal infection and/or
sequela of aspiration.

## 2020-08-26 ENCOUNTER — Ambulatory Visit
Admission: RE | Admit: 2020-08-26 | Discharge: 2020-08-26 | Disposition: A | Discharge status: Home / Self Care | Payer: Disability Insurance | Source: Ambulatory Visit | Attending: Preventative Medicine | Admitting: Preventative Medicine

## 2020-08-26 ENCOUNTER — Other Ambulatory Visit: Payer: Self-pay | Admitting: Preventative Medicine

## 2020-08-26 DIAGNOSIS — M545 Low back pain, unspecified: Secondary | ICD-10-CM

## 2020-08-27 ENCOUNTER — Other Ambulatory Visit: Payer: Self-pay | Admitting: Preventative Medicine

## 2020-08-27 DIAGNOSIS — M25561 Pain in right knee: Secondary | ICD-10-CM

## 2020-08-28 ENCOUNTER — Ambulatory Visit
Admission: RE | Admit: 2020-08-28 | Discharge: 2020-08-28 | Disposition: A | Discharge status: Home / Self Care | Payer: Disability Insurance | Source: Ambulatory Visit | Attending: Preventative Medicine | Admitting: Preventative Medicine

## 2020-08-28 DIAGNOSIS — M25561 Pain in right knee: Secondary | ICD-10-CM | POA: Diagnosis present

## 2020-08-28 DIAGNOSIS — M25562 Pain in left knee: Secondary | ICD-10-CM | POA: Diagnosis present

## 2020-08-28 DIAGNOSIS — M545 Low back pain, unspecified: Secondary | ICD-10-CM | POA: Insufficient documentation

## 2020-12-16 DIAGNOSIS — M545 Low back pain, unspecified: Secondary | ICD-10-CM | POA: Insufficient documentation

## 2021-02-18 IMAGING — CR DG LUMBAR SPINE 2-3V
1 series · 3 of 3 positions shown · non-contrast
Comparison: None.

CLINICAL DATA: Chronic lower back pain.

EXAM:
LUMBAR SPINE - 2-3 VIEW

[Series 1: dg lumbar spine 2-3 views · 0.14mm/px · 3 of 3 slices shown]
[im 1/3]
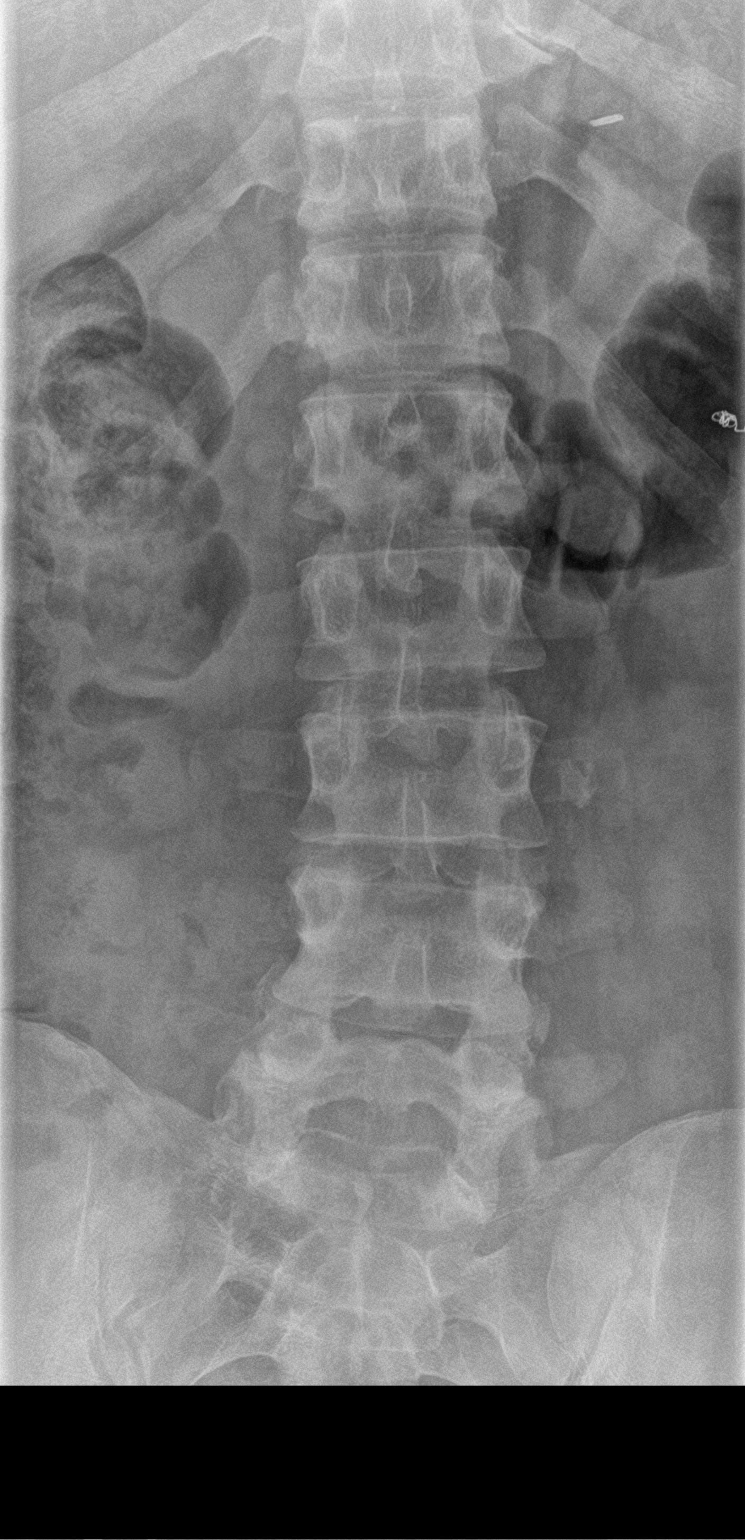
[im 2/3]
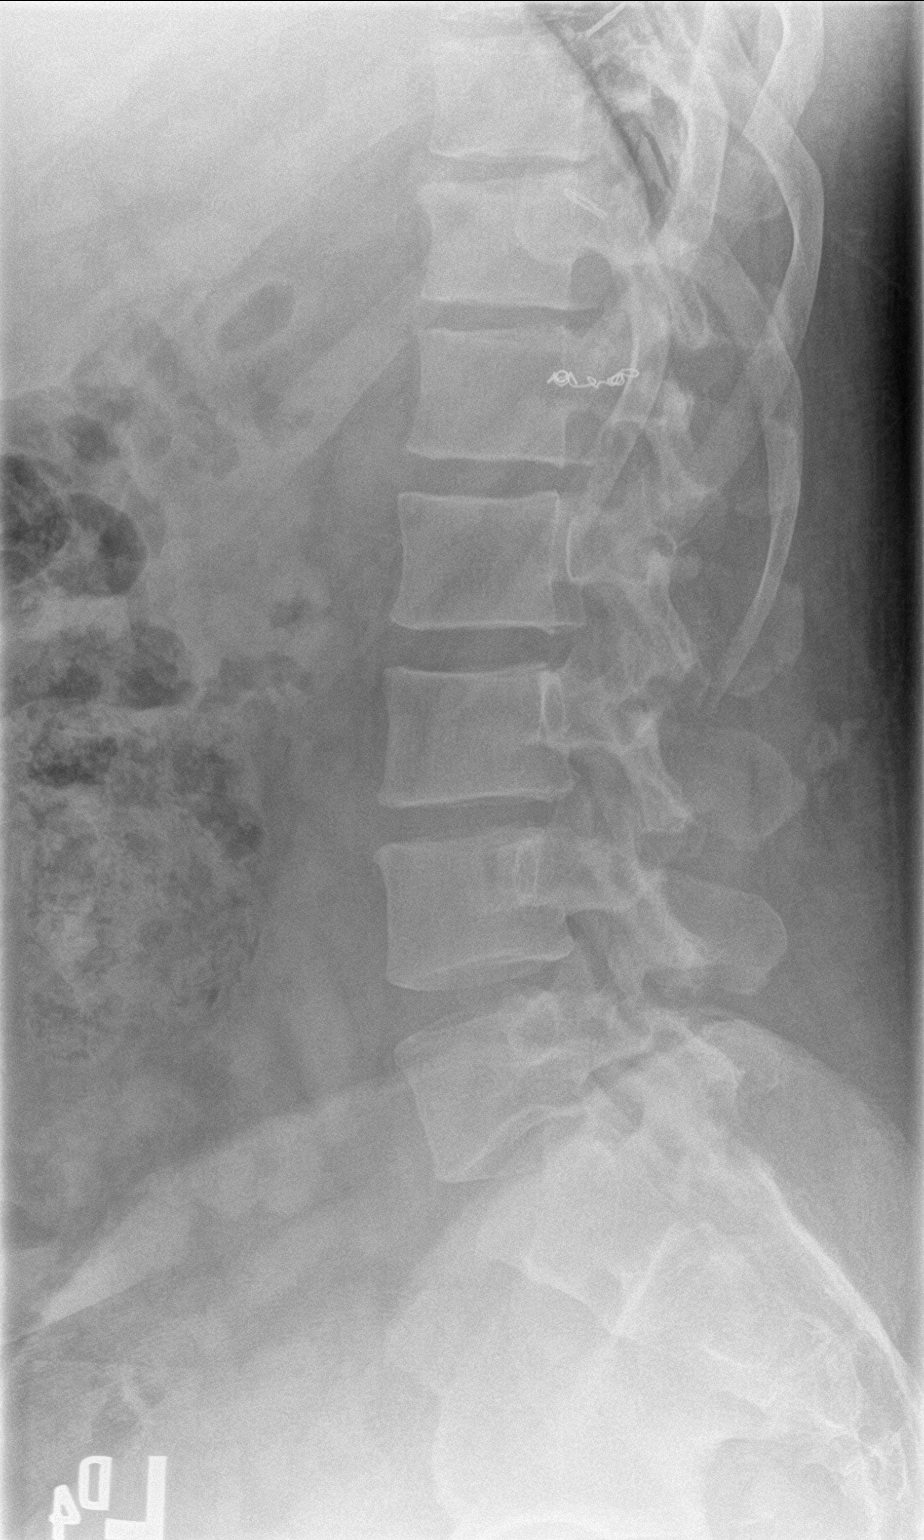
[im 3/3]
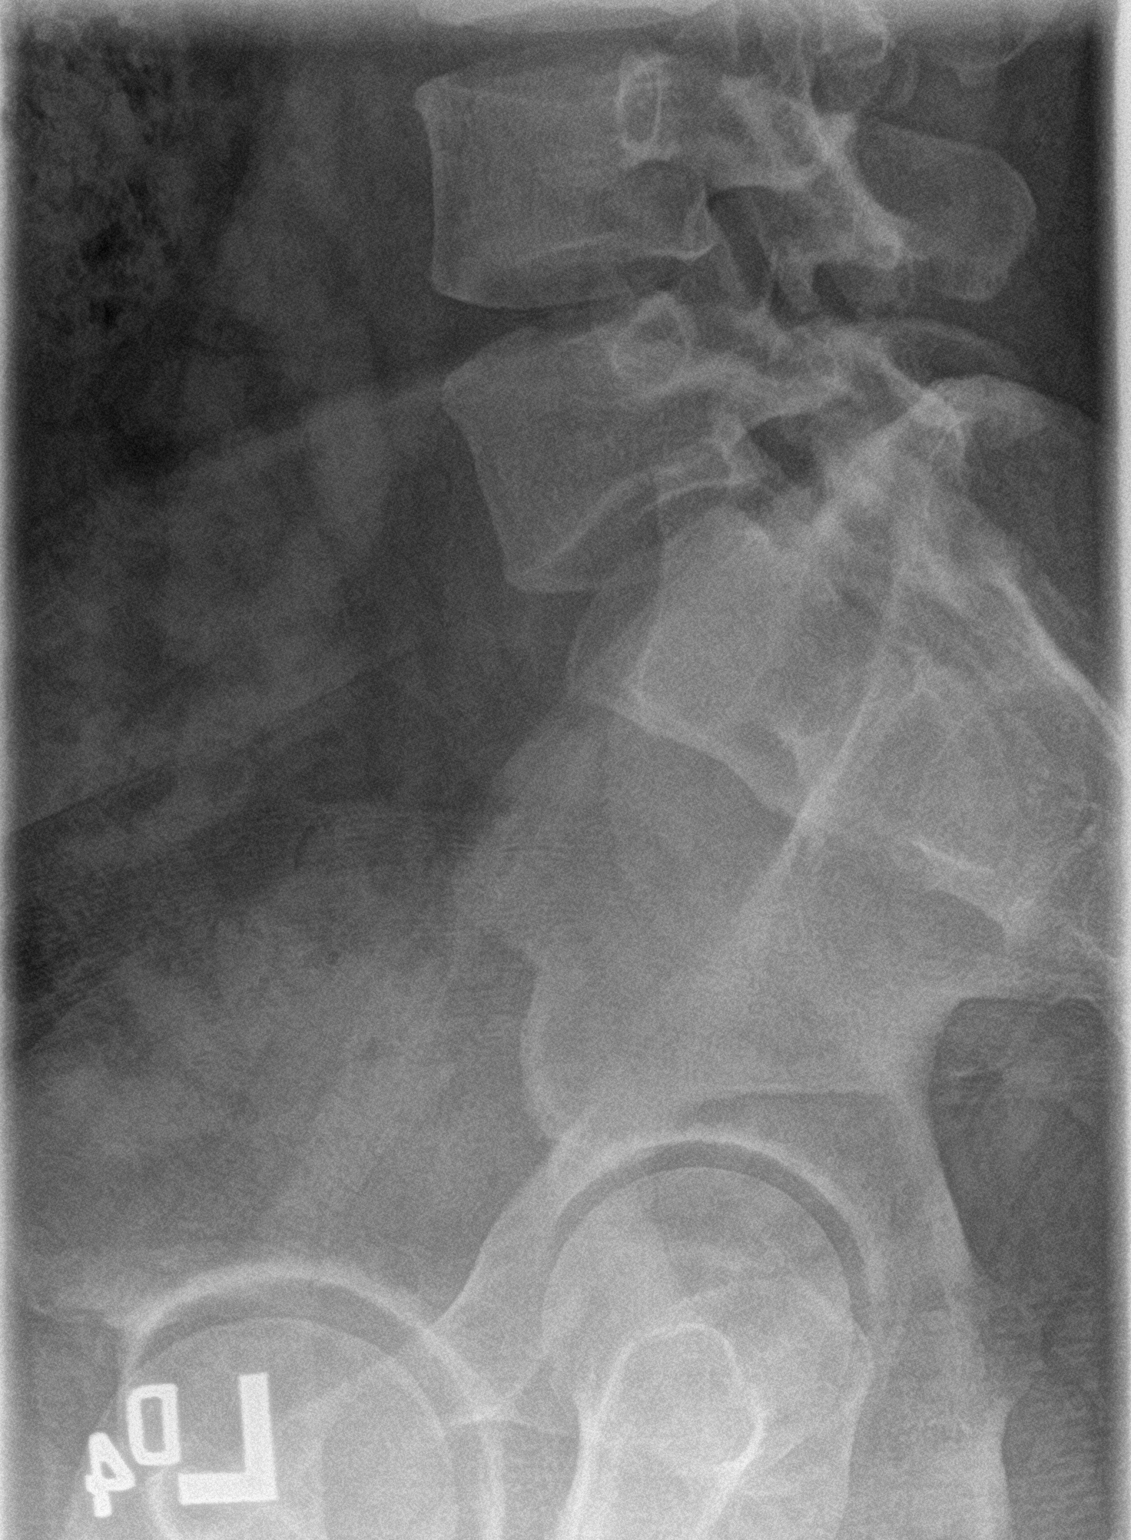

[3 of 3 positions shown; findings below may reference images not displayed]

FINDINGS: There is no evidence of lumbar spine fracture. There is very mild
levoscoliosis of the lower lumbar spine. Intervertebral disc spaces
are maintained. Surgical clips and radiopaque surgical coils are
seen overlying the left upper quadrant.
IMPRESSION: Very mild levoscoliosis of the lower lumbar spine.

## 2021-03-27 DIAGNOSIS — Z9081 Acquired absence of spleen: Secondary | ICD-10-CM | POA: Insufficient documentation

## 2021-09-15 LAB — CBC AND DIFFERENTIAL
HCT: 42 (ref 41–53)
Hemoglobin: 13.8 (ref 13.5–17.5)
Neutrophils Absolute: 7.7
Platelets: 272 10*3/uL (ref 150–400)
WBC: 11.9

## 2021-09-15 LAB — BASIC METABOLIC PANEL
BUN: 5 (ref 4–21)
CO2: 27 — AB (ref 13–22)
Chloride: 103 (ref 99–108)
Creatinine: 0.9 (ref 0.6–1.3)
Glucose: 93
Potassium: 5.4 mEq/L — AB (ref 3.5–5.1)
Sodium: 138 (ref 137–147)

## 2021-09-15 LAB — HEPATIC FUNCTION PANEL
ALT: 27 U/L (ref 10–40)
AST: 26 (ref 14–40)
Alkaline Phosphatase: 102 (ref 25–125)
Bilirubin, Total: 0.3

## 2021-09-15 LAB — COMPREHENSIVE METABOLIC PANEL
Albumin: 3.7 (ref 3.5–5.0)
Calcium: 8.5 — AB (ref 8.7–10.7)
eGFR: >90

## 2021-09-15 LAB — POCT ERYTHROCYTE SEDIMENTATION RATE, NON-AUTOMATED: Sed Rate: 4

## 2021-09-15 LAB — CBC: RBC: 4.49 (ref 3.87–5.11)

## 2022-04-19 ENCOUNTER — Emergency Department
Admission: EM | Admit: 2022-04-19 | Discharge: 2022-04-19 | Disposition: A | Discharge status: Home / Self Care | Payer: Medicare Other | Attending: Emergency Medicine | Admitting: Emergency Medicine

## 2022-04-19 ENCOUNTER — Other Ambulatory Visit: Payer: Self-pay

## 2022-04-19 DIAGNOSIS — S0991XA Unspecified injury of ear, initial encounter: Secondary | ICD-10-CM | POA: Diagnosis present

## 2022-04-19 DIAGNOSIS — X58XXXA Exposure to other specified factors, initial encounter: Secondary | ICD-10-CM | POA: Diagnosis not present

## 2022-04-19 DIAGNOSIS — S00411A Abrasion of right ear, initial encounter: Secondary | ICD-10-CM | POA: Insufficient documentation

## 2022-04-19 DIAGNOSIS — H9201 Otalgia, right ear: Secondary | ICD-10-CM

## 2022-04-19 MED ORDER — NEOMYCIN-POLYMYXIN-HC 3.5-10000-1 OT SUSP
3.0000 [drp] | Freq: Once | OTIC | Status: AC
  Administered 2022-04-19: 3 [drp] via OTIC
  Filled 2022-04-19: qty 10

## 2022-04-19 NOTE — ED Provider Notes (Signed)
[facility] Provider Note    None    (approximate)   History   Insect Bite   HPI  [name] is a 41 y.o. male   presents to the ED with complaint of possible insect bite to the inside of his right ear.  Patient states that he was at a store when he felt a sting sensation at his right ear.  Patient states that he has been scratching and digging to the inside of his ear with his finger.  He denies any difficulty hearing and did not actually see anything around his head.  Patient has a history of accidental drug overdose, adjustment disorder with mixed anxiety, aspiration pneumonia and respiratory failure with hypoxia.      Physical Exam   Triage Vital Signs: ED Triage Vitals  Enc Vitals Group     BP 04/19/22 0713 113/77     Pulse Rate 04/19/22 0713 81     Resp 04/19/22 0713 18     Temp 04/19/22 0713 98.3 F (36.8 C)     Temp Source 04/19/22 0713 Oral     SpO2 04/19/22 0713 100 %     Weight 04/19/22 0712 230 lb (104.3 kg)     Height 04/19/22 0712 6\' 1"  (1.854 m)     Head Circumference --      Peak Flow --      Pain Score 04/19/22 0712 9     Pain Loc --      Pain Edu? --      Excl. in [place]? --     Most recent vital signs: Vitals:   04/19/22 0713  BP: 113/77  Pulse: 81  Resp: 18  Temp: 98.3 F (36.8 C)  SpO2: 100%     General: Awake, no distress.  CV:  Good peripheral perfusion.  Resp:  Normal effort.  Abd:  No distention.  Other:  Right external ear with minimal amount of blood.  This was cleaned and there is a superficial 1 cm scratch to the superior aspect of the ear with also an open area of skin distal to this area.  EAC is tender during examination but no foreign body or trauma is noted.   ED Results / Procedures / Treatments   Labs (all labs ordered are listed, but only abnormal results are displayed) Labs Reviewed - No data to display     PROCEDURES:  Critical Care performed:   Procedures   MEDICATIONS ORDERED  IN ED: Medications  neomycin-polymyxin-hydrocortisone (CORTISPORIN) OTIC (EAR) suspension 3 drop (3 drops Right EAR Given 04/19/22 0809)     IMPRESSION / MDM / ASSESSMENT AND PLAN / ED COURSE  I reviewed the triage vital signs and the nursing notes.   Differential diagnosis includes, but is not limited to, insect bite, self-inflicted trauma right ear, right otalgia, otitis external.  41 year old male presents to the ED with concerns of his right ear having an insect bite to it however on exam area appears to be more of a self-inflicted scratch to his ear lobe which has been bleeding slightly.  This was cleaned and no foreign body or insect was noted in the ear canal itself.  No evidence of injury.  Patient was made aware that there was no foreign body.  Patient was discharged with Cortisporin otic suspension 3 drops while in the ED and to continue at home 4 times a day as needed.  He is also encouraged to follow-up with his PCP if any continued  problems or concerns.  He also was made aware that the external portion of his scratch can be cleaned daily and minimal amount of Neosporin can be applied.      Patient's presentation is most consistent with acute, uncomplicated illness.  FINAL CLINICAL IMPRESSION(S) / ED DIAGNOSES   Final diagnoses:  Acute otalgia, right     Rx / DC Orders   ED Discharge Orders     None        Note:  This document was prepared using Dragon voice recognition software and may include unintentional dictation errors.   Johnn Hai, PA-C 04/19/22 1417    Delman Kitten, MD 04/19/22 1549

## 2022-04-19 NOTE — ED Triage Notes (Signed)
Pt comes pov with possible insect bite to inside of right ear. Thinks it might still be in there. Has a headache from it. Got bits of it out with his finger.

## 2022-04-19 NOTE — Discharge Instructions (Signed)
Use the eardrops that were given to you today to your right ear 3 drops 4 times a day.  He may also take Tylenol if needed for pain.  Follow-up with your primary care provider if any continued problems.

## 2022-07-17 ENCOUNTER — Other Ambulatory Visit: Payer: Self-pay

## 2022-07-17 ENCOUNTER — Emergency Department
Admission: EM | Admit: 2022-07-17 | Discharge: 2022-07-17 | Disposition: A | Discharge status: Home / Self Care | Payer: Medicare Other | Attending: Emergency Medicine | Admitting: Emergency Medicine

## 2022-07-17 DIAGNOSIS — L989 Disorder of the skin and subcutaneous tissue, unspecified: Secondary | ICD-10-CM | POA: Insufficient documentation

## 2022-07-17 DIAGNOSIS — R7309 Other abnormal glucose: Secondary | ICD-10-CM | POA: Insufficient documentation

## 2022-07-17 DIAGNOSIS — Z4800 Encounter for change or removal of nonsurgical wound dressing: Secondary | ICD-10-CM | POA: Diagnosis not present

## 2022-07-17 DIAGNOSIS — Z5189 Encounter for other specified aftercare: Secondary | ICD-10-CM

## 2022-07-17 LAB — CBG MONITORING, ED: Glucose-Capillary: 118 mg/dL — ABNORMAL HIGH (ref 70–99)

## 2022-07-17 MED ORDER — CEPHALEXIN 500 MG PO CAPS: 500.0000 mg | ORAL_CAPSULE | Freq: Three times a day (TID) | ORAL | 0 refills | Status: DC

## 2022-07-17 NOTE — Discharge Instructions (Addendum)
Keep your appointment at the wound care Center. A prescription for Keflex was sent to the pharmacy for you to begin taking 3 times a day for the next 10 days.  Also clean these areas daily and to the larger areas put a dressing over them to prevent any abrasions or scratching of the area. Blood sugar was not elevated and no concern about diabetes at this time.  You should follow-up with your primary care provider for reevaluation once a year.

## 2022-07-17 NOTE — ED Provider Notes (Signed)
[facility] Provider Note    Event Date/Time   First MD Initiated Contact with Patient 07/17/22 [phone]     (approximate)   History   Leg Wound   HPI  [name] is a 41 y.o. male presents to the ED for evaluation of the sores that he has chronically on his legs bilaterally.  Mother states that she has an appointment at the wound care center which is still a long ways away.  Mother suspects that patient is picking at these areas and she is concerned that if they are infected.  Patient has history of respiratory failure with hypoxia, aspiration pneumonia, drug overdose and opioid abuse.     Physical Exam   Triage Vital Signs: ED Triage Vitals  Enc Vitals Group     BP 07/17/22 0908 117/69     Pulse Rate 07/17/22 0908 77     Resp 07/17/22 0908 17     Temp 07/17/22 0908 (!) 97.4 F (36.3 C)     Temp Source 07/17/22 0908 Oral     SpO2 07/17/22 0908 96 %     Weight 07/17/22 0910 245 lb (111.1 kg)     Height 07/17/22 0915 6\' 1"  (1.854 m)     Head Circumference --      Peak Flow --      Pain Score 07/17/22 0910 7     Pain Loc --      Pain Edu? --      Excl. in [place]? --     Most recent vital signs: Vitals:   07/17/22 0908  BP: 117/69  Pulse: 77  Resp: 17  Temp: (!) 97.4 F (36.3 C)  SpO2: 96%     General: Awake, no distress.  Ambulatory without any assistance. CV:  Good peripheral perfusion.  Resp:  Normal effort.  Abd:  No distention.  Other:  There are several superficial skin wounds to the lower extremities with the worst being medial aspect of each leg.  No active drainage however areas are very irritated.  No surrounding cellulitis is present.  Patient also has noted areas on his arms that are similar to on his legs however they do not seem to be as involved or as deep as the wounds on his legs.   ED Results / Procedures / Treatments   Labs (all labs ordered are listed, but only abnormal results are displayed) Labs Reviewed  CBG  MONITORING, ED - Abnormal; Notable for the following components:      Result Value   Glucose-Capillary 118 (*)    All other components within normal limits     PROCEDURES:  Critical Care performed:   Procedures   MEDICATIONS ORDERED IN ED: Medications - No data to display   IMPRESSION / MDM / ASSESSMENT AND PLAN / ED COURSE  I reviewed the triage vital signs and the nursing notes.   Differential diagnosis includes, but is not limited to, wound recheck, wound dressing, chronic wounds with possible infection rule out cellulitis.  41 year old male presents to the ED with various wounds to his lower extremities that look very possible to be self-inflicted.  Mother states that patient does pick at his skin where patient denies this.  Areas are suspicious for an early infection but no active drainage was noted.  Patient was encouraged to clean daily with mild soap and water and to apply dressings to the area to prevent him from scratching at them.  A prescription for Keflex was sent  to the pharmacy for him to begin taking.  Mother will call the wound care center to see if there are any openings sooner than her scheduled time to take him.  She was told to return to the emergency department if any severe worsening of his symptoms.      Patient's presentation is most consistent with acute complicated illness / injury requiring diagnostic workup.  FINAL CLINICAL IMPRESSION(S) / ED DIAGNOSES   Final diagnoses:  Encounter for wound re-check     Rx / DC Orders   ED Discharge Orders          Ordered    cephALEXin (KEFLEX) 500 MG capsule  3 times daily        07/17/22 1032             Note:  This document was prepared using Dragon voice recognition software and may include unintentional dictation errors.   Tommi Rumps, PA-C 07/17/22 1511    Sharyn Creamer, MD 07/17/22 [phone]

## 2022-07-17 NOTE — ED Notes (Signed)
See triage note  Presents with family   Pt has wounds to both lower legs  Mom states the sores have been there for several months  Pt is a daily fentanyl user  No fever  Min swelling noted to BLE    Mom states he is not able to get in the wound clinic until Oct..

## 2022-07-17 NOTE — ED Triage Notes (Signed)
Pt arrives with c/o bilateral leg wounds that have been there for about a month. Pt denies fevers. Pt was referred to wound care for follow up ,but appt is not until next month.

## 2022-08-14 DIAGNOSIS — F1120 Opioid dependence, uncomplicated: Secondary | ICD-10-CM | POA: Diagnosis not present

## 2022-08-14 DIAGNOSIS — F22 Delusional disorders: Secondary | ICD-10-CM | POA: Insufficient documentation

## 2022-08-17 ENCOUNTER — Encounter: Payer: Medicare PPO | Attending: Physician Assistant | Admitting: Physician Assistant

## 2022-08-17 DIAGNOSIS — L98492 Non-pressure chronic ulcer of skin of other sites with fat layer exposed: Secondary | ICD-10-CM | POA: Diagnosis not present

## 2022-08-17 DIAGNOSIS — L03116 Cellulitis of left lower limb: Secondary | ICD-10-CM | POA: Diagnosis not present

## 2022-08-17 DIAGNOSIS — L03114 Cellulitis of left upper limb: Secondary | ICD-10-CM | POA: Insufficient documentation

## 2022-08-17 DIAGNOSIS — L03115 Cellulitis of right lower limb: Secondary | ICD-10-CM | POA: Insufficient documentation

## 2022-08-17 DIAGNOSIS — L03113 Cellulitis of right upper limb: Secondary | ICD-10-CM | POA: Insufficient documentation

## 2022-08-17 DIAGNOSIS — F19959 Other psychoactive substance use, unspecified with psychoactive substance-induced psychotic disorder, unspecified: Secondary | ICD-10-CM | POA: Insufficient documentation

## 2022-08-17 DIAGNOSIS — L97812 Non-pressure chronic ulcer of other part of right lower leg with fat layer exposed: Secondary | ICD-10-CM | POA: Diagnosis not present

## 2022-08-17 DIAGNOSIS — L97822 Non-pressure chronic ulcer of other part of left lower leg with fat layer exposed: Secondary | ICD-10-CM | POA: Insufficient documentation

## 2022-08-21 NOTE — Progress Notes (Signed)
[name], [name] ([id number]) Visit Report for 08/17/2022 Chief Complaint Document Details Patient Name: [name], [name] Date of Service: 08/17/2022 8:45 AM Medical Record Number: [id number] Patient Account Number: [id number] Date of Birth/Sex: 1981-01-17 (41 y.o. 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 Male) Treating RN: Carlene Coria Primary Care Provider: Crissie Figures Other Clinician: Referring Provider: Thomasene Ripple Treating Provider/Extender: Skipper Cliche in Treatment: 0 Debridement Performed for Wound #1 Right,Lateral Lower Leg Assessment: Performed By: Physician Tommie Sams., PA-C Debridement Type: Chemical/Enzymatic/Mechanical Agent Used: saline and gauze Level of Consciousness (Pre- Awake and Alert procedure): Pre-procedure Verification/Time Out Yes - 10:00 Taken: Start Time: 10:00 Instrument: Other : saline gauze Bleeding: Minimum End Time: 10:10 Procedural Pain: 0 Post Procedural Pain: 0 Response to Treatment: Procedure was tolerated well Level of Consciousness (Post- Awake and Alert procedure): Post Debridement Measurements of Total Wound Length: (cm) 6.2 Width: (cm) 3.5 Depth: (cm) 0.2 Volume: (cm) 3.409 Character of Wound/Ulcer Post Debridement: Improved Post  Procedure Diagnosis Same as Pre-procedure Electronic Signature(s) Signed: 08/18/2022 6:10:21 PM By: Worthy Keeler PA-C Signed: 08/21/2022 12:25:01 PM By: Carlene Coria RN Entered By: Carlene Coria on 08/17/2022 10:01:36 [name], [name] ([id number]) -------------------------------------------------------------------------------- Debridement Details Patient Name: [name] Date of Service: 08/17/2022 8:45 AM Medical Record Number: [id number] Patient Account Number: [id number] Date of Birth/Sex: 1980/11/20 (41 y.o. Male) Treating RN: Carlene Coria Primary Care Provider: Crissie Figures Other Clinician: Referring Provider: Thomasene Ripple Treating Provider/Extender: Skipper Cliche in Treatment: 0 Debridement Performed for Wound #2 Right,Medial Lower Leg Assessment: Performed By: Physician Tommie Sams., PA-C Debridement Type: Chemical/Enzymatic/Mechanical Agent Used: saline and gauze Level of Consciousness (Pre- Awake and Alert procedure): Pre-procedure Verification/Time Out Yes - 10:00 Taken: Start Time: 10:00 Instrument: Other : saline gauze Bleeding: Minimum End Time: 10:10 Procedural Pain: 0 Post Procedural Pain: 0 Response to Treatment: Procedure was tolerated well Level of Consciousness (Post- Awake and Alert procedure): Post Debridement Measurements of Total Wound Length: (cm) 6.5 Width: (cm) 2 Depth: (cm) 0.2 Volume: (cm) 2.042 Character of Wound/Ulcer Post Debridement: Improved Post Procedure Diagnosis Same as Pre-procedure Electronic Signature(s) Signed: 08/18/2022 6:10:21 PM By: Worthy Keeler PA-C Signed: 08/21/2022 12:25:01 PM By: Carlene Coria RN Entered By: Carlene Coria on 08/17/2022 10:02:01 [name] ([id number]) -------------------------------------------------------------------------------- Debridement Details Patient Name: [name] Date of Service: 08/17/2022 8:45 AM Medical Record Number: [id number] Patient Account Number:  [id number] Date of Birth/Sex: February 03, 1981 (41 y.o. Male) Treating RN: Carlene Coria Primary Care Provider: Crissie Figures Other Clinician: Referring Provider: Thomasene Ripple Treating Provider/Extender: Jeri Cos Weeks in Treatment: 0 Debridement Performed for Wound #3 Left,Medial Lower Leg Assessment: Performed By: Physician Tommie Sams., PA-C Debridement Type: Chemical/Enzymatic/Mechanical Agent Used: saline and gauze Level of Consciousness (Pre- Awake and Alert procedure): Pre-procedure Verification/Time Out Yes - 10:00 Taken: Start Time: 10:00 Instrument: Other : saline gauze Bleeding: Minimum End Time: 10:10 Procedural Pain: 0 Post Procedural Pain: 0 Response to Treatment: Procedure was tolerated well Level of Consciousness (Post- Awake and Alert procedure): Post Debridement Measurements of Total Wound  Length: (cm) 5 Width: (cm) 10 Depth: (cm) 0.2 Volume: (cm) 7.854 Character of Wound/Ulcer Post Debridement: Improved Post Procedure Diagnosis Same as Pre-procedure Electronic Signature(s) Signed: 08/18/2022 6:10:21 PM By: Worthy Keeler PA-C Signed: 08/21/2022 12:25:01 PM By: Carlene Coria RN Entered By: Carlene Coria on 08/17/2022 10:02:17 [name], [name] ([id number]) -------------------------------------------------------------------------------- Debridement Details Patient Name: [name] Date of Service: 08/17/2022 8:45 AM Medical Record Number: [id number] Patient Account Number: [id number] Date of Birth/Sex: [birth year] (41 y.o. Male) Treating RN: Carlene Coria Primary Care Provider: Crissie Figures Other Clinician: Referring Provider: Thomasene Ripple Treating Provider/Extender: Skipper Cliche in Treatment: 0 Debridement Performed for Wound #4 Left,Lateral Lower Leg Assessment: Performed By: Physician Tommie Sams., PA-C Debridement Type: Chemical/Enzymatic/Mechanical Agent Used: saline and gauze Level of Consciousness (Pre- Awake and  Alert procedure): Pre-procedure Verification/Time Out Yes - 10:00 Taken: Start Time: 10:00 Instrument: Other : saline gauze Bleeding: Minimum End Time: 10:10 Procedural Pain: 0 Post Procedural Pain: 0 Response to Treatment: Procedure was tolerated well Level of Consciousness (Post- Awake and Alert procedure): Post Debridement Measurements of Total Wound Length: (cm) 8 Width: (cm) 5.5 Depth: (cm) 0.2 Volume: (cm) 6.912 Character of Wound/Ulcer Post Debridement: Improved Post Procedure Diagnosis Same as Pre-procedure Electronic Signature(s) Signed: 08/18/2022 6:10:21 PM By: Worthy Keeler PA-C Signed: 08/21/2022 12:25:01 PM By: Carlene Coria RN Entered By: Carlene Coria on 08/17/2022 10:02:50 [name], [name] ([id number]) -------------------------------------------------------------------------------- Debridement Details Patient Name: [name] Date of Service: 08/17/2022 8:45 AM Medical Record Number: [id number] Patient Account Number: [id number] Date of Birth/Sex: 05/13/1981 (41 y.o. Male) Treating RN: Carlene Coria Primary Care Provider: Crissie Figures Other Clinician: Referring Provider: Thomasene Ripple Treating Provider/Extender: Skipper Cliche in Treatment: 0 Debridement Performed for Wound #5 Right,Circumferential Forearm Assessment: Performed By: Physician Tommie Sams., PA-C Debridement Type: Chemical/Enzymatic/Mechanical Agent Used: saline and gauze Level of Consciousness (Pre- Awake and Alert procedure): Pre-procedure Verification/Time Out Yes - 10:00 Taken: Start Time: 10:00 Instrument: Other : saline gauze Bleeding: Minimum End Time: 10:10 Procedural Pain: 0 Post Procedural Pain: 0 Response to Treatment: Procedure was tolerated well Level of Consciousness (Post- Awake and Alert procedure): Post Debridement Measurements of Total Wound Length: (cm) 18 Width: (cm) 37 Depth: (cm) 0.2 Volume: (cm) 104.615 Character of Wound/Ulcer Post  Debridement: Improved Post Procedure Diagnosis Same as Pre-procedure Electronic Signature(s) Signed: 08/18/2022 6:10:21 PM By: Worthy Keeler PA-C Signed: 08/21/2022 12:25:01 PM By: Carlene Coria RN Entered By: Carlene Coria on 08/17/2022 10:03:12 [name] ([id number]) -------------------------------------------------------------------------------- Debridement Details Patient Name: [name] Date of Service: 08/17/2022 8:45 AM Medical Record Number: [id number] Patient Account Number: [id number] Date of Birth/Sex: 1981-10-12 (41 y.o. Male) Treating RN: Carlene Coria Primary Care Provider: Crissie Figures Other Clinician: Referring Provider: Thomasene Ripple Treating Provider/Extender: Skipper Cliche in Treatment: 0 Debridement Performed for Wound #6 Left,Circumferential Forearm Assessment: Performed By: Physician Tommie Sams., PA-C Debridement Type: Chemical/Enzymatic/Mechanical Agent Used: saline and gauze Level of Consciousness (Pre- Awake and Alert procedure): Pre-procedure Verification/Time Out Yes - 10:00 Taken: Start Time: 10:00 Instrument: Other : saline gauze Bleeding: Minimum End Time: 10:10 Procedural Pain: 0 Post Procedural Pain: 0 Response to Treatment: Procedure was tolerated well Level of Consciousness (Post- Awake and Alert procedure): Post Debridement Measurements of Total Wound Length: (cm) 17 Width: (cm) 34 Depth: (cm) 0.2 Volume: (cm) 90.792 Character of Wound/Ulcer Post Debridement: Improved Post Procedure Diagnosis Same as Pre-procedure Electronic Signature(s) Signed: 08/18/2022 6:10:21 PM By: Worthy Keeler PA-C Signed: 08/21/2022 12:25:01 PM By: Carlene Coria RN Entered By: Carlene Coria on  08/17/2022 10:03:31 [name], [name] ([id number]) -------------------------------------------------------------------------------- HPI Details Patient Name: [name], [name] Date of Service: 08/17/2022 8:45 AM Medical Record Number: [id number] Patient  Account Number: [id number] Date of Birth/Sex: Aug 05, 1981 (41 y.o. Male) Treating RN: Carlene Coria Primary Care Provider: Crissie Figures Other Clinician: Referring Provider: Thomasene Ripple Treating Provider/Extender: Skipper Cliche in Treatment: 0 History of Present Illness HPI Description: 08-17-2022 upon evaluation today patient has multiple wounds over the bilateral arms and lower extremities. 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 He tells me that he has had the wounds on the legs for quite some time and he has not used in that area in the past 6 months. Nonetheless he also has not been able to get him to heal based on what he tells me. He does tell me though that he continues to use and inject the fentanyl although last time was a week ago. It has been discussed with him when he was in the hospital going into rehabilitation. With that being said he tells me the last time he did this that it really was not good and really was not helpful. Nonetheless I do believe this is still something that needs to be strongly considered and I had a really good conversation with him today in this regard. I discussed that further and the plan. In regard to the wounds themselves the patient unfortunately is having a lot of issues with necrotic eschar which is really tightly adhered in a lot of places. I do believe that he has sufficient blood flow to heal though I think this is just where the tissue honestly which just destroyed the paren from infiltrating fentanyl when he was injecting. This is good to be more difficult to heal due to the deeper damage and I discussed that with him today as well he is also had cellulitis although that looks to be resolving to me at this point. The good news is he really does not have any other major medical problems other than substance abuse. Electronic Signature(s) Signed: 08/18/2022 6:06:43 PM By: Worthy Keeler PA-C Entered By: Worthy Keeler on 08/18/2022 18:06:43 [name], [name] ([id number]) -------------------------------------------------------------------------------- Physical Exam Details Patient Name: [name] Date of Service: 08/17/2022 8:45 AM Medical Record Number: [id number] Patient Account Number: [id number] Date of Birth/Sex: 1981-01-15 (41 y.o. Male) Treating RN: Carlene Coria Primary Care Provider: Crissie Figures Other Clinician: Referring Provider: Thomasene Ripple Treating Provider/Extender: Skipper Cliche in Treatment: 0 Constitutional patient is hypertensive.. pulse regular and within target range for patient.Marland Kitchen respirations regular, non-labored and within target range for patient.Marland Kitchen temperature within target range for patient.. Well-nourished and well-hydrated in no acute distress. Eyes conjunctiva clear no eyelid edema noted. pupils equal round and reactive to light and accommodation. Ears, Nose, Mouth, and Throat no gross abnormality of ear auricles or external auditory canals. normal hearing noted during conversation. mucus membranes moist. Respiratory normal breathing without difficulty. Cardiovascular 2+ dorsalis pedis/posterior tibialis pulses. no clubbing, cyanosis, significant edema, <3 sec cap refill. Musculoskeletal normal gait and posture. no significant deformity or arthritic changes, no loss or range of motion, no clubbing. Psychiatric this patient is able to make decisions and demonstrates good insight into disease process. Alert and Oriented x 3. pleasant and cooperative. Notes Upon inspection patient's wounds again appear to be very dry with significant eschar in a lot of areas. With that being said I do not see any evidence of infection per se at this point but I do believe that  he has a lot of deeper injury due to injecting the fentanyl that is going to take quite a bit of time to get this to heal. Skin to start with softening up and getting these eschar areas off. Electronic  Signature(s) Signed: 08/18/2022 6:07:18 PM By: Worthy Keeler PA-C Entered By: Worthy Keeler on 08/18/2022 18:07:18 [place], [name] ([id number]) -------------------------------------------------------------------------------- Physician Orders Details Patient Name: [name] Date of Service: 08/17/2022 8:45 AM Medical Record Number: [id number] Patient Account Number: [id number] Date of Birth/Sex: 1981/05/30 (41 y.o. 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 Bathing/ Shower/ Hygiene o May shower; gently cleanse wound with antibacterial soap, rinse and pat dry prior to dressing wounds Edema Control - Lymphedema / Segmental Compressive Device / Other o Elevate, Exercise Daily and Avoid Standing for Long Periods of Time. o Elevate legs to the level of the heart and pump ankles as often as possible o Elevate leg(s) parallel to the floor when sitting. Wound Treatment Wound #1 - Lower Leg Wound Laterality: Right, Lateral Cleanser: [employer] Ancillary Kit - 15 Day Supply (DME) (Generic) 3 x Per Week/30 Days Discharge Instructions: Use supplies as instructed; Kit contains: (15) Saline  Bullets; (15) 3x3 Gauze; 15 pr Gloves Cleanser: Soap and Water 3 x Per Week/30 Days Discharge Instructions: Gently cleanse wound with antibacterial soap, rinse and pat dry prior to dressing wounds Topical: Activon Honey Gel, 25 (g) Tube 3 x Per Week/30 Days Primary Dressing: Xeroform 4x4-HBD (in/in) (DME) (Generic) 3 x Per Week/30 Days Discharge Instructions: Apply Xeroform 4x4-HBD (in/in) as directed Secondary Dressing: ABD Pad 5x9 (in/in) 3 x Per Week/30 Days Discharge Instructions: Cover with ABD pad Secured With: Medipore Tape - 26M Medipore H Soft Cloth Surgical Tape, 2x2 (in/yd) (DME) (Generic) 3 x Per Week/30 Days Secured With: [employer] Sterile or Non-Sterile 6-ply 4.5x4 (yd/yd) (DME) (Generic) 3 x Per Week/30 Days Discharge Instructions: Apply Kerlix as directed Wound #2 - Lower Leg Wound Laterality: Right, Medial Cleanser: Byram Ancillary Kit - 15 Day Supply (DME) (Generic) 3 x Per Week/30 Days Discharge Instructions: Use supplies as instructed; Kit contains: (15) Saline Bullets; (15) 3x3 Gauze; 15 pr Gloves Cleanser: Soap and Water 3 x Per Week/30 Days Discharge Instructions: Gently cleanse wound with antibacterial soap, rinse and pat dry prior to dressing wounds Topical: Activon Honey Gel, 25 (g) Tube 3 x Per Week/30 Days Betzler, Cordarrel ([id number]) Primary Dressing: Xeroform 4x4-HBD (in/in) (DME) (Generic) 3 x Per Week/30 Days Discharge Instructions: Apply Xeroform 4x4-HBD (in/in) as directed Secondary Dressing: ABD Pad 5x9 (in/in) 3 x Per Week/30 Days Discharge Instructions: Cover with ABD pad Secured With: Medipore Tape - 26M Medipore H Soft Cloth Surgical Tape, 2x2 (in/yd) (DME) (Generic) 3 x Per Week/30 Days Secured With: [employer] Sterile or Non-Sterile 6-ply 4.5x4 (yd/yd) (DME) (Generic) 3 x Per Week/30 Days Discharge Instructions: Apply  Kerlix as directed Wound #3 - Lower Leg Wound Laterality: Left, Medial Cleanser: [employer]ram Ancillary Kit - 15 Day Supply (DME) (Generic) 3 x  Per Week/30 Days Discharge Instructions: Use supplies as instructed; Kit contains: (15) Saline Bullets; (15) 3x3 Gauze; 15 pr Gloves Cleanser: Soap and Water 3 x Per Week/30 Days Discharge Instructions: Gently cleanse wound with antibacterial soap, rinse and pat dry prior to dressing wounds Topical: Activon Honey Gel, 25 (g) Tube 3 x Per Week/30 Days Primary Dressing: Xeroform 4x4-HBD (in/in) (DME) (Generic) 3 x Per Week/30 Days Discharge Instructions: Apply Xeroform 4x4-HBD (in/in) as directed Secondary Dressing: ABD Pad 5x9 (in/in) 3 x Per Week/30 Days Discharge Instructions: Cover with ABD pad Secured With: Medipore Tape - 67M Medipore H Soft Cloth Surgical Tape, 2x2 (in/yd) (DME) (Generic) 3 x Per Week/30 Days Secured With: [employer] Sterile or Non-Sterile 6-ply 4.5x4 (yd/yd) (DME) (Generic) 3 x Per Week/30 Days Discharge Instructions: Apply Kerlix as directed Wound #4 - Lower Leg Wound Laterality: Left, Lateral Cleanser: [employer]ram Ancillary Kit - 15 Day Supply (DME) (Generic) 3 x Per Week/30 Days Discharge Instructions: Use supplies as instructed; Kit contains: (15) Saline Bullets; (15) 3x3 Gauze; 15 pr Gloves Cleanser: Soap and Water 3 x Per Week/30 Days Discharge Instructions: Gently cleanse wound with antibacterial soap, rinse and pat dry prior to dressing wounds Topical: Activon Honey Gel, 25 (g) Tube 3 x Per Week/30 Days Primary Dressing: Xeroform 4x4-HBD (in/in) (DME) (Generic) 3 x Per Week/30 Days Discharge Instructions: Apply Xeroform 4x4-HBD (in/in) as directed Secondary Dressing: ABD Pad 5x9 (in/in) 3 x Per Week/30 Days Discharge Instructions: Cover with ABD pad Secured With: Medipore Tape - 67M Medipore H Soft Cloth Surgical Tape, 2x2 (in/yd) (DME) (Generic) 3 x Per Week/30 Days Secured With: [employer] Sterile or Non-Sterile 6-ply 4.5x4 (yd/yd) (DME) (Generic) 3 x Per Week/30 Days Discharge Instructions: Apply Kerlix as directed Wound #5 - Forearm Wound Laterality: Right,  Circumferential Cleanser: [employer]ram Ancillary Kit - 15 Day Supply (DME) (Generic) 3 x Per Week/30 Days Discharge Instructions: Use supplies as instructed; Kit contains: (15) Saline Bullets; (15) 3x3 Gauze; 15 pr Gloves Cleanser: Soap and Water 3 x Per Week/30 Days Discharge Instructions: Gently cleanse wound with antibacterial soap, rinse and pat dry prior to dressing wounds Topical: Activon Honey Gel, 25 (g) Tube 3 x Per Week/30 Days Primary Dressing: Xeroform 4x4-HBD (in/in) (DME) (Generic) 3 x Per Week/30 Days Discharge Instructions: Apply Xeroform 4x4-HBD (in/in) as directed Secondary Dressing: ABD Pad 5x9 (in/in) 3 x Per Week/30 Days Discharge Instructions: Cover with ABD pad [name], [name] ([id number]) Secured With: Medipore Tape - 67M Medipore H Soft Cloth Surgical Tape, 2x2 (in/yd) (DME) (Generic) 3 x Per Week/30 Days Secured With: [employer] Sterile or Non-Sterile 6-ply 4.5x4 (yd/yd) (DME) (Generic) 3 x Per Week/30 Days Discharge Instructions: Apply Kerlix as directed Wound #6 - Forearm Wound Laterality: Left, Circumferential Cleanser: [employer] Ancillary Kit - 15 Day Supply (DME) (Generic) 3 x Per Week/30 Days Discharge Instructions: Use supplies as instructed; Kit contains: (15) Saline Bullets; (15) 3x3 Gauze; 15 pr Gloves Cleanser: Soap and Water 3 x Per Week/30 Days Discharge Instructions: Gently cleanse wound with antibacterial soap, rinse and pat dry prior to dressing wounds Topical: Activon Honey Gel, 25 (g) Tube 3 x Per Week/30 Days Primary Dressing: Xeroform 4x4-HBD (in/in) (DME) (Generic) 3 x Per Week/30 Days Discharge Instructions: Apply Xeroform 4x4-HBD (in/in) as directed Secondary Dressing: ABD Pad 5x9 (in/in) 3 x Per Week/30 Days Discharge Instructions: Cover with ABD pad Secured With:  Medipore Tape - 23M Medipore H Soft Cloth Surgical Tape, 2x2 (in/yd) (DME) (Generic) 3 x Per Week/30 Days Secured With: Kerlix Roll Sterile or Non-Sterile 6-ply 4.5x4 (yd/yd) (DME) (Generic) 3 x  Per Week/30 Days Discharge Instructions: Apply Kerlix as directed Electronic Signature(s) Signed: 08/18/2022 6:10:21 PM By: Worthy Keeler PA-C Signed: 08/21/2022 12:25:01 PM By: Carlene Coria RN Entered By: Carlene Coria on 08/17/2022 10:21:48 [name] ([id number]) -------------------------------------------------------------------------------- Problem List Details Patient Name: [name] Date of Service: 08/17/2022 8:45 AM Medical Record Number: [id number] Patient Account Number: [id number] Date of Birth/Sex: Apr 09, 1981 (41 y.o. 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 08/17/2022 No Yes exposed L97.822 Non-pressure chronic ulcer of other part of left lower leg with fat layer 08/17/2022 No Yes exposed L98.492 Non-pressure chronic ulcer of skin of other sites with fat layer exposed 08/17/2022 No Yes L03.113 Cellulitis of right upper limb 08/17/2022 No Yes L03.114 Cellulitis of left upper limb 08/17/2022 No Yes L03.115 Cellulitis of right lower limb 08/17/2022 No Yes L03.116 Cellulitis of left lower limb 08/17/2022 No Yes Inactive Problems Resolved Problems Electronic Signature(s) Signed: 08/17/2022 9:49:38 AM By: Worthy Keeler PA-C Entered By: Worthy Keeler on 08/17/2022 09:49:37 [place]ulinski, [name] ([id number]) -------------------------------------------------------------------------------- Progress Note Details Patient Name: [name] Date of Service: 08/17/2022 8:45 AM Medical Record Number: [id number] Patient Account Number: [id number] Date  of Birth/Sex: 1981-10-23 (41 y.o. 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 He tells me that he has had the wounds on the legs for quite some time and he has not used in that area in the past 6 months. Nonetheless he also has not been able to get him to heal based on what he tells me. He does tell me though that he continues to use and inject the fentanyl although last time was a week ago. It has been discussed with him when he was in the hospital going into rehabilitation. With that being said he tells me the last time he did this that it really was not good and really was not helpful. Nonetheless I do believe this is still something that needs to be strongly considered and I had a really good conversation with him today in this regard. I discussed that further and the plan. In regard to the wounds themselves the patient unfortunately is having a lot of issues with necrotic eschar which is really tightly adhered in a lot of places. I do believe that he has sufficient blood flow to heal though I think this is just where the tissue honestly which just destroyed the paren from infiltrating fentanyl when he was injecting. This is good to be more difficult to heal due to the deeper damage and I discussed that with him today as well he is also had cellulitis although that looks  to be resolving to me at this point. The good news is he really does not have any other major medical problems other than substance abuse. Patient  History Allergies No Known Allergies Social History Former smoker, Marital Status - Single, Alcohol Use - Rarely, Drug Use - Current History, Caffeine Use - Rarely. Review of Systems (ROS) Integumentary (Skin) Complains or has symptoms of Wounds, Swelling. Objective Constitutional patient is hypertensive.. pulse regular and within target range for patient.Marland Kitchen respirations regular, non-labored and within target range for patient.Marland Kitchen temperature within target range for patient.. Well-nourished and well-hydrated in no acute distress. Vitals Time Taken: 9:07 AM, Height: 73 in, Source: Stated, Weight: 230 lbs, Source: Stated, BMI: 30.3, Temperature: 97.9 F, Pulse: 93 bpm, Respiratory Rate: 18 breaths/min, Blood Pressure: 181/96 mmHg. Eyes conjunctiva clear no eyelid edema noted. pupils equal round and reactive to light and accommodation. Ears, Nose, Mouth, and Throat no gross abnormality of ear auricles or external auditory canals. normal hearing noted during conversation. mucus membranes moist. Respiratory normal breathing without difficulty. Cardiovascular 2+ dorsalis pedis/posterior tibialis pulses. no clubbing, cyanosis, significant edema, Musculoskeletal normal gait and posture. no significant deformity or arthritic changes, no loss or range of motion, no clubbing. [name], [name] ([id number]) Psychiatric this patient is able to make decisions and demonstrates good insight into disease process. Alert and Oriented x 3. pleasant and cooperative. General Notes: Upon inspection patient's wounds again appear to be very dry with significant eschar in a lot of areas. With that being said I do not see any evidence of infection per se at this point but I do believe that he has a lot of deeper injury due to injecting the fentanyl that is going to take quite a bit of time to get this to heal. Skin to start with softening up and getting these eschar areas off. Integumentary (Hair, Skin) Wound #1 status  is Open. Original cause of wound was Other Lesion. The date acquired was: 11/09/2021. The wound is located on the Right,Lateral Lower Leg. The wound measures 6.2cm length x 3.5cm width x 0.2cm depth; 17.043cm^2 area and 3.409cm^3 volume. There is Fat Layer (Subcutaneous Tissue) exposed. There is no tunneling or undermining noted. There is a medium amount of serosanguineous drainage noted. There is medium (34-66%) pink granulation within the wound bed. There is a medium (34-66%) amount of necrotic tissue within the wound bed including Adherent Slough. Wound #2 status is Open. Original cause of wound was Other Lesion. The date acquired was: 11/09/2021. The wound is located on the Right,Medial Lower Leg. The wound measures 6.5cm length x 2cm width x 0.2cm depth; 10.21cm^2 area and 2.042cm^3 volume. There is Fat Layer (Subcutaneous Tissue) exposed. There is no tunneling or undermining noted. There is a medium amount of serosanguineous drainage noted. There is medium (34-66%) red, pink granulation within the wound bed. There is a medium (34-66%) amount of necrotic tissue within the wound bed including Eschar and Adherent Slough. Wound #3 status is Open. Original cause of wound was Other Lesion. The date acquired was: 11/09/2021. The wound is located on the Left,Medial Lower Leg. The wound measures 5cm length x 10cm width x 0.2cm depth; 39.27cm^2 area and 7.854cm^3 volume. There is Fat Layer (Subcutaneous Tissue) exposed. There is no tunneling or undermining noted. There is a medium amount of serosanguineous drainage noted. There is medium (34-66%) red, pink granulation within the wound bed. There is a medium (34-66%) amount of necrotic tissue within the wound bed including Eschar and Adherent Slough. Wound #4 status  is Open. Original cause of wound was Other Lesion. The date acquired was: 11/09/2021. The wound is located on the Left,Lateral Lower Leg. The wound measures 8cm length x 5.5cm width x 0.2cm depth;  34.558cm^2 area and 6.912cm^3 volume. There is Fat Layer (Subcutaneous Tissue) exposed. There is no tunneling or undermining noted. There is a medium amount of serosanguineous drainage noted. There is medium (34-66%) red, pink granulation within the wound bed. There is a medium (34-66%) amount of necrotic tissue within the wound bed including Eschar and Adherent Slough. Wound #5 status is Open. Original cause of wound was Other Lesion. The date acquired was: 11/09/2021. The wound is located on the Right,Circumferential Forearm. The wound measures 18cm length x 37cm width x 0.2cm depth; 523.075cm^2 area and 104.615cm^3 volume. There is Fat Layer (Subcutaneous Tissue) exposed. There is no tunneling or undermining noted. There is a medium amount of serosanguineous drainage noted. There is medium (34-66%) red, pink granulation within the wound bed. There is a medium (34-66%) amount of necrotic tissue within the wound bed including Eschar and Adherent Slough. Wound #6 status is Open. Original cause of wound was Other Lesion. The date acquired was: 11/09/2021. The wound is located on the Left,Circumferential Forearm. The wound measures 17cm length x 34cm width x 0.2cm depth; 453.96cm^2 area and 90.792cm^3 volume. There is Fat Layer (Subcutaneous Tissue) exposed. There is no tunneling or undermining noted. There is a medium amount of serosanguineous drainage noted. There is medium (34-66%) red, pink granulation within the wound bed. There is a medium (34-66%) amount of necrotic tissue within the wound bed including Eschar and Adherent Slough. 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 There was a Chemical/Enzymatic/Mechanical debridement performed by Tommie Sams., PA-C. With the following instrument(s): saline gauze. Other agent used was saline and gauze. A time out was conducted at 10:00, prior to the start of the procedure. A Minimum amount of bleeding was controlled with N/A. The procedure was tolerated well with a pain level of 0 throughout and a pain level of 0 following the procedure. Post Debridement Measurements: 6.2cm length x 3.5cm width x 0.2cm depth; 3.409cm^3 volume. Character of Wound/Ulcer Post Debridement is improved. RADLEY, BARTO ([id number]) Post procedure Diagnosis Wound #1: Same as Pre-Procedure Wound #2 Pre-procedure diagnosis of Wound #2 is an Atypical located on the Right,Medial Lower Leg . There was a Chemical/Enzymatic/Mechanical debridement performed by Tommie Sams., PA-C. With the following instrument(s): saline gauze. Other agent used was saline and gauze. A time out was conducted at 10:00, prior to the start of the procedure. A Minimum amount of bleeding was controlled with N/A. The procedure was tolerated well with a pain level of 0 throughout and a pain level of 0 following the procedure. Post Debridement Measurements: 6.5cm length x 2cm width x 0.2cm depth; 2.042cm^3 volume. Character of Wound/Ulcer Post Debridement is improved. Post procedure Diagnosis Wound #2: Same as Pre-Procedure Wound #3 Pre-procedure diagnosis of Wound #3 is an Atypical located on the Left,Medial Lower Leg .  There was a Chemical/Enzymatic/Mechanical debridement performed by Tommie Sams., PA-C. With the following instrument(s): saline gauze. Other agent used was saline and gauze. A time out was conducted at 10:00,  prior to the start of the procedure. A Minimum amount of bleeding was controlled with N/A. The procedure was tolerated well with a pain level of 0 throughout and a pain level of 0 following the procedure. Post Debridement Measurements: 5cm length x 10cm width x 0.2cm depth; 7.854cm^3 volume. Character of Wound/Ulcer Post Debridement is improved. Post procedure Diagnosis Wound #3: Same as Pre-Procedure Wound #4 Pre-procedure diagnosis of Wound #4 is an Atypical located on the Left,Lateral Lower Leg . There was a Chemical/Enzymatic/Mechanical debridement performed by Tommie Sams., PA-C. With the following instrument(s): saline gauze. Other agent used was saline and gauze. A time out was conducted at 10:00, prior to the start of the procedure. A Minimum amount of bleeding was controlled with N/A. The procedure was tolerated well with a pain level of 0 throughout and a pain level of 0 following the procedure. Post Debridement Measurements: 8cm length x 5.5cm width x 0.2cm depth; 6.912cm^3 volume. Character of Wound/Ulcer Post Debridement is improved. Post procedure Diagnosis Wound #4: Same as Pre-Procedure Wound #5 Pre-procedure diagnosis of Wound #5 is an Atypical located on the Right,Circumferential Forearm . There was a Chemical/Enzymatic/Mechanical debridement performed by Tommie Sams., PA-C. With the following instrument(s): saline gauze. Other agent used was saline and gauze. A time out was conducted at 10:00, prior to the start of the procedure. A Minimum amount of bleeding was controlled with N/A. The procedure was tolerated well with a pain level of 0 throughout and a pain level of 0 following the procedure. Post Debridement Measurements: 18cm length x 37cm width x 0.2cm depth; 104.615cm^3 volume. Character of Wound/Ulcer Post Debridement is improved. Post procedure Diagnosis Wound #5: Same as Pre-Procedure Wound #6 Pre-procedure diagnosis of Wound #6 is an Atypical located on the  Left,Circumferential Forearm . There was a Chemical/Enzymatic/Mechanical debridement performed by Tommie Sams., PA-C. With the following instrument(s): saline gauze. Other agent used was saline and gauze. A time out was conducted at 10:00, prior to the start of the procedure. A Minimum amount of bleeding was controlled with N/A. The procedure was tolerated well with a pain level of 0 throughout and a pain level of 0 following the procedure. Post Debridement Measurements: 17cm length x 34cm width x 0.2cm depth; 90.792cm^3 volume. Character of Wound/Ulcer Post Debridement is improved. Post procedure Diagnosis Wound #6: Same as Pre-Procedure Plan Follow-up Appointments: Return Appointment in 1 week. Bathing/ Shower/ Hygiene: May shower; gently cleanse wound with antibacterial soap, rinse and pat dry prior to dressing wounds Edema Control - Lymphedema / Segmental Compressive Device / Other: Elevate, Exercise Daily and Avoid Standing for Long Periods of Time. Elevate legs to the level of the heart and pump ankles as often as possible Elevate leg(s) parallel to the floor when sitting. WOUND #1: - Lower Leg Wound Laterality: Right, Lateral Cleanser: [employer] Ancillary Kit - 15 Day Supply (DME) (Generic) 3 x Per Week/30 Days Discharge Instructions: Use supplies as instructed; Kit contains: (15) Saline Bullets; (15) 3x3 Gauze; 15 pr Gloves Cleanser: Soap and Water 3 x Per Week/30 Days Discharge Instructions: Gently cleanse wound with antibacterial soap, rinse and pat dry prior to dressing wounds Topical: Activon Honey Gel, 25 (g) Tube 3 x Per Week/30 Days Primary Dressing: Xeroform 4x4-HBD (in/in) (DME) (Generic) 3 x Per Week/30 Days Discharge Instructions: Apply Xeroform  4x4-HBD (in/in) as directed Secondary Dressing: ABD Pad 5x9 (in/in) 3 x Per Week/30 Days Discharge Instructions: Cover with ABD pad Secured With: Medipore Tape - 36M Medipore H Soft Cloth Surgical Tape, 2x2 (in/yd) (DME) (Generic) 3  x Per Week/30 Days Gullo, [name] ([id number]) Secured With: Kerlix Roll Sterile or Non-Sterile 6-ply 4.5x4 (yd/yd) (DME) (Generic) 3 x Per Week/30 Days Discharge Instructions: Apply Kerlix as directed WOUND #2: - Lower Leg Wound Laterality: Right, Medial Cleanser: [employer] Ancillary Kit - 15 Day Supply (DME) (Generic) 3 x Per Week/30 Days Discharge Instructions: Use supplies as instructed; Kit contains: (15) Saline Bullets; (15) 3x3 Gauze; 15 pr Gloves Cleanser: Soap and Water 3 x Per Week/30 Days Discharge Instructions: Gently cleanse wound with antibacterial soap, rinse and pat dry prior to dressing wounds Topical: Activon Honey Gel, 25 (g) Tube 3 x Per Week/30 Days Primary Dressing: Xeroform 4x4-HBD (in/in) (DME) (Generic) 3 x Per Week/30 Days Discharge Instructions: Apply Xeroform 4x4-HBD (in/in) as directed Secondary Dressing: ABD Pad 5x9 (in/in) 3 x Per Week/30 Days Discharge Instructions: Cover with ABD pad Secured With: Medipore Tape - 36M Medipore H Soft Cloth Surgical Tape, 2x2 (in/yd) (DME) (Generic) 3 x Per Week/30 Days Secured With: [employer] Sterile or Non-Sterile 6-ply 4.5x4 (yd/yd) (DME) (Generic) 3 x Per Week/30 Days Discharge Instructions: Apply Kerlix as directed WOUND #3: - Lower Leg Wound Laterality: Left, Medial Cleanser: [employer]ram Ancillary Kit - 15 Day Supply (DME) (Generic) 3 x Per Week/30 Days Discharge Instructions: Use supplies as instructed; Kit contains: (15) Saline Bullets; (15) 3x3 Gauze; 15 pr Gloves Cleanser: Soap and Water 3 x Per Week/30 Days Discharge Instructions: Gently cleanse wound with antibacterial soap, rinse and pat dry prior to dressing wounds Topical: Activon Honey Gel, 25 (g) Tube 3 x Per Week/30 Days Primary Dressing: Xeroform 4x4-HBD (in/in) (DME) (Generic) 3 x Per Week/30 Days Discharge Instructions: Apply Xeroform 4x4-HBD (in/in) as directed Secondary Dressing: ABD Pad 5x9 (in/in) 3 x Per Week/30 Days Discharge Instructions: Cover with ABD  pad Secured With: Medipore Tape - 36M Medipore H Soft Cloth Surgical Tape, 2x2 (in/yd) (DME) (Generic) 3 x Per Week/30 Days Secured With: [employer] Sterile or Non-Sterile 6-ply 4.5x4 (yd/yd) (DME) (Generic) 3 x Per Week/30 Days Discharge Instructions: Apply Kerlix as directed WOUND #4: - Lower Leg Wound Laterality: Left, Lateral Cleanser: [employer]ram Ancillary Kit - 15 Day Supply (DME) (Generic) 3 x Per Week/30 Days Discharge Instructions: Use supplies as instructed; Kit contains: (15) Saline Bullets; (15) 3x3 Gauze; 15 pr Gloves Cleanser: Soap and Water 3 x Per Week/30 Days Discharge Instructions: Gently cleanse wound with antibacterial soap, rinse and pat dry prior to dressing wounds Topical: Activon Honey Gel, 25 (g) Tube 3 x Per Week/30 Days Primary Dressing: Xeroform 4x4-HBD (in/in) (DME) (Generic) 3 x Per Week/30 Days Discharge Instructions: Apply Xeroform 4x4-HBD (in/in) as directed Secondary Dressing: ABD Pad 5x9 (in/in) 3 x Per Week/30 Days Discharge Instructions: Cover with ABD pad Secured With: Medipore Tape - 36M Medipore H Soft Cloth Surgical Tape, 2x2 (in/yd) (DME) (Generic) 3 x Per Week/30 Days Secured With: [employer] Sterile or Non-Sterile 6-ply 4.5x4 (yd/yd) (DME) (Generic) 3 x Per Week/30 Days Discharge Instructions: Apply Kerlix as directed WOUND #5: - Forearm Wound Laterality: Right, Circumferential Cleanser: [employer]ram Ancillary Kit - 15 Day Supply (DME) (Generic) 3 x Per Week/30 Days Discharge Instructions: Use supplies as instructed; Kit contains: (15) Saline Bullets; (15) 3x3 Gauze; 15 pr Gloves Cleanser: Soap and Water 3 x Per Week/30 Days Discharge Instructions: Gently cleanse  wound with antibacterial soap, rinse and pat dry prior to dressing wounds Topical: Activon Honey Gel, 25 (g) Tube 3 x Per Week/30 Days Primary Dressing: Xeroform 4x4-HBD (in/in) (DME) (Generic) 3 x Per Week/30 Days Discharge Instructions: Apply Xeroform 4x4-HBD (in/in) as directed Secondary Dressing:  ABD Pad 5x9 (in/in) 3 x Per Week/30 Days Discharge Instructions: Cover with ABD pad Secured With: Medipore Tape - 5M Medipore H Soft Cloth Surgical Tape, 2x2 (in/yd) (DME) (Generic) 3 x Per Week/30 Days Secured With: [employer] Sterile or Non-Sterile 6-ply 4.5x4 (yd/yd) (DME) (Generic) 3 x Per Week/30 Days Discharge Instructions: Apply Kerlix as directed WOUND #6: - Forearm Wound Laterality: Left, Circumferential Cleanser: [employer] Ancillary Kit - 15 Day Supply (DME) (Generic) 3 x Per Week/30 Days Discharge Instructions: Use supplies as instructed; Kit contains: (15) Saline Bullets; (15) 3x3 Gauze; 15 pr Gloves Cleanser: Soap and Water 3 x Per Week/30 Days Discharge Instructions: Gently cleanse wound with antibacterial soap, rinse and pat dry prior to dressing wounds Topical: Activon Honey Gel, 25 (g) Tube 3 x Per Week/30 Days Primary Dressing: Xeroform 4x4-HBD (in/in) (DME) (Generic) 3 x Per Week/30 Days Discharge Instructions: Apply Xeroform 4x4-HBD (in/in) as directed Secondary Dressing: ABD Pad 5x9 (in/in) 3 x Per Week/30 Days Discharge Instructions: Cover with ABD pad Secured With: Medipore Tape - 5M Medipore H Soft Cloth Surgical Tape, 2x2 (in/yd) (DME) (Generic) 3 x Per Week/30 Days Secured With: [employer] Sterile or Non-Sterile 6-ply 4.5x4 (yd/yd) (DME) (Generic) 3 x Per Week/30 Days Discharge Instructions: Apply Kerlix as directed 1. Based on what I am seeing I Georgina Peer recommend that we actually go ahead and initiate treatment currently with a Medihoney followed by Xeroform gauze dressing I think this is going to help to soften things up and due to the widespread nature of these wounds I think it will be more cost effective than Santyl. 2. I am also can recommend an ABD pad to cover. 3. We will secure this with roll gauze. 4. I did discuss with the patient as well that part of the treatment plan here includes that I need him to not continue to use fentanyl by way of injection. I  explained to him that I do completely understand that this is a terrible disease process of addiction that is very difficult to get through and alleviate. With that being said I did recommend to him that he should look into a rehab facility and actually given the name of 1 in [place] which has been excellent for a lot of our patients that we have seen from there in the past I have always heard really good things [name], Alif ([id number]) about them. His mother was present during the evaluation today I gave the information to her and they said that they would definitely look into it. I believe that this is good to be his best bet as far as trying to get this under control and get his life back. I explained to him that if he does not and he continues to do what he is doing that 2 things primarily can happen. First and foremost and more immediately I will discharge him due to noncompliance as that goes directly against what we are trying to do here for him to be honest. Long-term and almost more importantly in my opinion is that he is also damaging himself in his body which is going to lead to his demise in the long run if he does not turn things around. This is poison and  little by little it is killing him the more he uses and the more he injects. He voiced understanding and I do believe is a good conversation I am hopeful that he will contact the rehab facility recommended. We will see patient back for reevaluation in 1 week here in the clinic. If anything worsens or changes patient will contact our office for additional recommendations. Electronic Signature(s) Signed: 08/18/2022 6:09:41 PM By: Worthy Keeler PA-C Entered By: Worthy Keeler on 08/18/2022 18:09:41 Oliver, [name] ([id number]) -------------------------------------------------------------------------------- ROS/PFSH Details Patient Name: [name] Date of Service: 08/17/2022 8:45 AM Medical Record Number: [id number] Patient  Account Number: [id number] Date of Birth/Sex: January 11, 1981 (41 y.o. Male) Treating RN: Carlene Coria Primary Care Provider: Crissie Figures Other Clinician: Referring Provider: Thomasene Ripple Treating Provider/Extender: Skipper Cliche in Treatment: 0 Integumentary (Skin) Complaints and Symptoms: Positive for: Wounds; Swelling Immunizations Pneumococcal Vaccine: Received Pneumococcal Vaccination: No Implantable Devices None Family and Social History Former smoker; Marital Status - Single; Alcohol Use: Rarely; Drug Use: Current History; Caffeine Use: Rarely; Financial Concerns: No; Food, Clothing or Shelter Needs: No; Support System Lacking: No; Transportation Concerns: No Electronic Signature(s) Signed: 08/18/2022 6:10:21 PM By: Worthy Keeler PA-C Signed: 08/21/2022 12:25:01 PM By: Carlene Coria RN Entered By: Carlene Coria on 08/17/2022 09:10:03 Fehring, [name] ([id number]) -------------------------------------------------------------------------------- SuperBill Details Patient Name: [name] Date of Service: 08/17/2022 Medical Record Number: [id number] Patient Account Number: [id number] Date of Birth/Sex: [birth year] (41 y.o. 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, unspecified L97.812 Non-pressure chronic ulcer of other part of right lower leg with fat layer exposed L97.822 Non-pressure chronic ulcer of other part of left lower leg with fat layer exposed L98.492 Non-pressure chronic ulcer of skin of other sites with fat layer exposed L03.113 Cellulitis of right upper limb L03.114 Cellulitis of left upper limb L03.115 Cellulitis of right lower limb L03.116 Cellulitis of left lower  limb Facility Procedures CPT4 Code: 83291916 Description: 60600 - WOUND CARE VISIT-LEV 5 EST PT Modifier: Quantity: 1 Physician Procedures CPT4: Description Modifier Quantity Code [id number] - WC PHYS LEVEL 4 - NEW PT 1 CPT4: ICD-10 Diagnosis Description F19.959 Other psychoactive substance use, 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

## 2022-08-21 NOTE — Progress Notes (Signed)
BRAVEN, WOLK ([id number]) Visit Report for 08/17/2022 Abuse Risk Screen Details Patient Name: [name], [name] Date of Service: 08/17/2022 8:45 AM Medical Record Number: [id number] Patient Account Number: [id number] Date of Birth/Sex: September 12, 1981 (41 y.o. Male) Treating RN: Yevonne Pax Primary Care Erionna Strum: Gorden Harms Other Clinician: Referring Blaze Sandin: Edmonia Lynch Treating Irvine Glorioso/Extender: Rowan Blase in Treatment: 0 Abuse Risk Screen Items Answer ABUSE RISK SCREEN: Has anyone close to you tried to hurt or harm you recentlyo No Do you feel uncomfortable with anyone in your familyo No Has anyone forced you do things that you didnot want to doo No Electronic Signature(s) Signed: 08/21/2022 12:25:01 PM By: Yevonne Pax RN Entered By: Yevonne Pax on 08/17/2022 09:10:09 Bowell, [name] ([id number]) -------------------------------------------------------------------------------- Activities of Daily Living Details Patient Name: [name] Date of Service: 08/17/2022 8:45 AM Medical Record Number: [id number] Patient Account Number: [id number] Date of Birth/Sex: 15-Sep-1981 (40 y.o. Male) Treating RN: Yevonne Pax Primary Care Rin Gorton: Gorden Harms Other Clinician: Referring Leonette Tischer: Edmonia Lynch Treating Ole Lafon/Extender: Rowan Blase in Treatment: 0 Activities of Daily Living Items Answer Activities of Daily Living (Please select one for each item) Drive Automobile Completely Able Take Medications Completely Able Use Telephone Completely Able Care for Appearance Completely Able Use Toilet Completely Able Bath / Shower Completely Able Dress Self Completely Able Feed Self Completely Able Walk Completely Able Get In / Out Bed Completely Able Housework Completely Able Prepare Meals Completely Able Handle Money Completely Able Shop for Self Completely Able Electronic Signature(s) Signed: 08/21/2022 12:25:01 PM By: Yevonne Pax RN Entered By: Yevonne Pax on  08/17/2022 09:10:31 Purdon, [name] ([id number]) -------------------------------------------------------------------------------- Education Screening Details Patient Name: [name] Date of Service: 08/17/2022 8:45 AM Medical Record Number: [id number] Patient Account Number: [id number] Date of Birth/Sex: July 12, 1981 (40 y.o. Male) Treating RN: Yevonne Pax Primary Care Robertson Colclough: Gorden Harms Other Clinician: Referring Elzora Cullins: Edmonia Lynch Treating Natalynn Pedone/Extender: Rowan Blase in Treatment: 0 Primary Learner Assessed: Patient Learning Preferences/Education Level/Primary Language Learning Preference: Explanation Highest Education Level: High School Preferred Language: English Cognitive Barrier Language Barrier: No Translator Needed: No Memory Deficit: No Emotional Barrier: No Cultural/Religious Beliefs Affecting Medical Care: No Physical Barrier Impaired Vision: Yes Contacts Impaired Hearing: No Decreased Hand dexterity: No Knowledge/Comprehension Knowledge Level: Medium Comprehension Level: Medium Ability to understand written instructions: Medium Ability to understand verbal instructions: Medium Motivation Anxiety Level: Anxious Cooperation: Cooperative Education Importance: Denies Need Perception: Coherent Willingness to Engage in Self-Management Low Activities: Readiness to Engage in Self-Management Low Activities: Electronic Signature(s) Signed: 08/21/2022 12:25:01 PM By: Yevonne Pax RN Entered By: Yevonne Pax on 08/17/2022 09:11:26 Hagwood, [name] ([id number]) -------------------------------------------------------------------------------- Fall Risk Assessment Details Patient Name: [name] Date of Service: 08/17/2022 8:45 AM Medical Record Number: [id number] Patient Account Number: [id number] Date of Birth/Sex: 1981-09-03 (40 y.o. Male) Treating RN: Yevonne Pax Primary Care Shirleyann Montero: Gorden Harms Other Clinician: Referring Smt. Loder: Edmonia Lynch Treating Kohner Orlick/Extender: Rowan Blase in Treatment: 0 Fall Risk Assessment Items Have you had 2 or more falls in the last 12 monthso 0 No Have you had any fall that resulted in injury in the last 12 monthso 0 No FALLS RISK SCREEN History of falling - immediate or within 3 months 0 No Secondary diagnosis (Do you have 2 or more medical diagnoseso) 0 No Ambulatory aid None/bed rest/wheelchair/nurse 0 No Crutches/cane/walker 0 No Furniture 0 No Intravenous therapy Access/Saline/Heparin Lock 0 No Gait/Transferring Normal/ bed rest/ wheelchair 0 No Weak (short steps with or without shuffle, stooped but able to lift head while walking, may 0 No  seek support from furniture) Impaired (short steps with shuffle, may have difficulty arising from chair, head down, impaired 0 No balance) Mental Status Oriented to own ability 0 No Electronic Signature(s) Signed: 08/21/2022 12:25:01 PM By: Carlene Coria RN Entered By: Carlene Coria on 08/17/2022 09:11:34 Aja, [name] ([id number]) -------------------------------------------------------------------------------- Foot Assessment Details Patient Name: [name] Date of Service: 08/17/2022 8:45 AM Medical Record Number: [id number] Patient Account Number: [id number] Date of Birth/Sex: 24-Oct-1981 (41 y.o. Male) Treating RN: Carlene Coria Primary Care Rika Daughdrill: Crissie Figures Other Clinician: Referring Reighn Kaplan: Thomasene Ripple Treating Israa Caban/Extender: Jeri Cos Weeks in Treatment: 0 Foot Assessment Items Site Locations + = Sensation present, - = Sensation absent, C = Callus, U = Ulcer R = Redness, W = Warmth, M = Maceration, PU = Pre-ulcerative lesion F = Fissure, S = Swelling, D = Dryness Assessment Right: Left: Other Deformity: No No Prior Foot Ulcer: No No Prior Amputation: No No Charcot Joint: No No Ambulatory Status: Ambulatory Without Help Gait: Steady Electronic Signature(s) Signed: 08/21/2022 12:25:01 PM By: Carlene Coria RN Entered By: Carlene Coria on 08/17/2022 09:14:08 Fieldhouse, [name] ([id number]) -------------------------------------------------------------------------------- Nutrition Risk Screening Details Patient Name: [name] Date of Service: 08/17/2022 8:45 AM Medical Record Number: [id number] Patient Account Number: [id number] Date of Birth/Sex: [birth year] (41 y.o. Male) Treating RN: Carlene Coria Primary Care Ayden Apodaca: Crissie Figures Other Clinician: Referring Taygan Connell: Thomasene Ripple Treating Lenis Nettleton/Extender: Jeri Cos Weeks in Treatment: 0 Height (in): 73 Weight (lbs): 230 Body Mass Index (BMI): 30.3 Nutrition Risk Screening Items Score Screening NUTRITION RISK SCREEN: I have an illness or condition that made me change the kind and/or amount of food I eat 0 No I eat fewer than two meals per day 0 No I eat few fruits and vegetables, or milk products 0 No I have three or more drinks of beer, liquor or wine almost every day 0 No I have tooth or mouth problems that make it hard for me to eat 0 No I don't always have enough money to buy the food I need 0 No I eat alone most of the time 0 No I take three or more different prescribed or over-the-counter drugs a day 0 No Without wanting to, I have lost or gained 10 pounds in the last six months 0 No I am not always physically able to shop, cook and/or feed myself 0 No Nutrition Protocols Good Risk Protocol 0 No interventions needed Moderate Risk Protocol High Risk Proctocol Risk Level: Good Risk Score: 0 Electronic Signature(s) Signed: 08/21/2022 12:25:01 PM By: Carlene Coria RN Entered By: Carlene Coria on 08/17/2022 09:11:44

## 2022-08-21 NOTE — Progress Notes (Signed)
[name] ([id number]) Visit Report for 08/17/2022 Allergy List Details Patient Name: [name] Date of Service: 08/17/2022 8:45 AM Medical Record Number: [id number] Patient Account Number: [id number] Date of Birth/Sex: 02/11/1981 (41 y.o. Male) Treating RN: Carlene Coria Primary Care Karter Hellmer: Crissie Figures Other Clinician: Referring Tricha Ruggirello: Thomasene Ripple Treating Lyal Husted/Extender: Jeri Cos Weeks in Treatment: 0 Allergies Active Allergies No Known Allergies Allergy Notes Electronic Signature(s) Signed: 08/21/2022 12:25:01 PM By: Carlene Coria RN Entered By: Carlene Coria on 08/17/2022 09:07:53 [name], [name] ([id number]) -------------------------------------------------------------------------------- Arrival Information Details Patient Name: [name] Date of Service: 08/17/2022 8:45 AM Medical Record Number: [id number] Patient Account Number: [id number] Date of Birth/Sex: 10-Jul-1981 (41 y.o. Male) Treating RN: Carlene Coria Primary Care Windi Toro: Crissie Figures Other Clinician: Referring Laura Caldas: Thomasene Ripple Treating Dariusz Brase/Extender: Skipper Cliche in Treatment: 0 Visit Information Patient Arrived: Ambulatory Arrival Time: 09:06 Accompanied By: mother Transfer Assistance: None Patient Identification Verified: Yes Secondary Verification Process Completed: Yes Patient Requires Transmission-Based Precautions: No Patient Has Alerts: No Electronic Signature(s) Signed: 08/21/2022 12:25:01 PM By: Carlene Coria RN Entered By: Carlene Coria on 08/17/2022 09:06:59 [name], [name] ([id number]) -------------------------------------------------------------------------------- Clinic Level of Care Assessment Details Patient Name: [name] Date of Service: 08/17/2022 8:45 AM Medical Record Number: [id number] Patient Account Number: [id number] Date of Birth/Sex: 1981/08/20 (41 y.o. Male) Treating RN: Carlene Coria Primary Care Kiela Shisler: Crissie Figures Other  Clinician: Referring Taron Conrey: Thomasene Ripple Treating Zaccheus Edmister/Extender: Skipper Cliche in Treatment: 0 Clinic Level of Care Assessment Items TOOL 2 Quantity Score X - Use when only an EandM is performed on the INITIAL visit 1 0 ASSESSMENTS - Nursing Assessment / Reassessment X - General Physical Exam (combine w/ comprehensive assessment (listed just below) when performed on new 1 20 pt. evals) X- 1 25 Comprehensive Assessment (HX, ROS, Risk Assessments, Wounds Hx, etc.) ASSESSMENTS - Wound and Skin Assessment / Reassessment _0  - Simple Wound Assessment / Reassessment - one wound 0 X- 6 5 Complex Wound Assessment / Reassessment - multiple wounds _1  - 0 Dermatologic / Skin Assessment (not related to wound area) ASSESSMENTS - Ostomy and/or Continence Assessment and Care _2  - Incontinence Assessment and Management 0 _3  - 0 Ostomy Care Assessment and Management (repouching, etc.) PROCESS - Coordination of Care _4  - Simple Patient / Family Education for ongoing care 0 X- 1 20 Complex (extensive) Patient / Family Education for ongoing care _5  - 0 Staff obtains Programmer, systems, Records, Test Results / Process Orders _6  - 0 Staff telephones HHA, Nursing Homes / Clarify orders / etc _7  - 0 Routine Transfer to another Facility (non-emergent condition) _8  - 0 Routine Hospital Admission (non-emergent condition) X- 1 15 New Admissions / Biomedical engineer / Ordering NPWT, Apligraf, etc. _9  - 0 Emergency Hospital Admission (emergent condition) X- 1 10 Simple Discharge Coordination _10  - 0 Complex (extensive) Discharge Coordination PROCESS - Special Needs _11  - Pediatric / Minor Patient Management 0 _12  - 0 Isolation Patient Management _13  - 0 Hearing / Language / Visual special needs _14  - 0 Assessment of Community assistance (transportation, D/C planning, etc.) _15  - 0 Additional assistance / Altered mentation _16  - 0 Support Surface(s) Assessment (bed, cushion, seat,  etc.) INTERVENTIONS - Wound Cleansing / Measurement X - Wound Imaging (photographs - any number of wounds) 1 5 _17  - 0 Wound Tracing (instead of photographs) _18  - 0 Simple Wound Measurement - one wound X- 6 5 Complex Wound Measurement - multiple wounds [name], [name] ([id number]) _19  - 0 Simple Wound Cleansing - one wound X- 6 5 Complex Wound  Cleansing - multiple wounds INTERVENTIONS - Wound Dressings X - Small Wound Dressing one or multiple wounds 4 10 X- 2 15 Medium Wound Dressing one or multiple wounds _0  - 0 Large Wound Dressing one or multiple wounds <[id number]>_1<\/[id number]>_4  - 0 Application of Medications - injection INTERVENTIONS - Miscellaneous _2  - External ear exam 0 _3  - 0 Specimen Collection (cultures, biopsies, blood, body fluids, etc.) _4  - 0 Specimen(s) / Culture(s) sent or taken to Lab for analysis _5  - 0 Patient Transfer (multiple staff / Harrel Lemon Lift / Similar devices) _6  - 0 Simple Staple / Suture removal (25 or less) _7  - 0 Complex Staple / Suture removal (26 or more) _8  - 0 Hypo / Hyperglycemic Management (close monitor of Blood Glucose) X- 1 15 Ankle / Brachial Index (ABI) - do not check if billed separately Has the patient been seen at the hospital within the last three years: Yes Total Score: 270 Level Of Care: New/Established - Level 5 Electronic Signature(s) Signed: 08/21/2022 12:25:01 PM By: Carlene Coria RN Entered By: Carlene Coria on 08/17/2022 10:45:56 [name], [name] ([id number]) -------------------------------------------------------------------------------- Encounter Discharge Information Details Patient Name: [name] Date of Service: 08/17/2022 8:45 AM Medical Record Number: [id number] Patient Account Number: [id number] Date of Birth/Sex: 1981/08/16 (41 y.o. Male) Treating RN: Carlene Coria Primary Care Chisom Aust: Crissie Figures Other Clinician: Referring Braidyn Peace: Thomasene Ripple Treating Nyemah Watton/Extender: Skipper Cliche in Treatment: 0 Encounter  Discharge Information Items Post Procedure Vitals Discharge Condition: Stable Temperature (F): 97.9 Ambulatory Status: Ambulatory Pulse (bpm): 93 Discharge Destination: Home Respiratory Rate (breaths/min): 18 Transportation: Private Auto Blood Pressure (mmHg): 181/96 Accompanied By: mother Schedule Follow-up Appointment: Yes Clinical Summary of Care: Electronic Signature(s) Signed: 08/17/2022 10:47:22 AM By: Carlene Coria RN Entered By: Carlene Coria on 08/17/2022 10:47:21 [name], [name] ([id number]) -------------------------------------------------------------------------------- Lower Extremity Assessment Details Patient Name: [name] Date of Service: 08/17/2022 8:45 AM Medical Record Number: [id number] Patient Account Number: [id number] Date of Birth/Sex: Apr 03, 1981 (41 y.o. Male) Treating RN: Carlene Coria Primary Care Korbin Notaro: Crissie Figures Other Clinician: Referring Donnis Pecha: Thomasene Ripple Treating Arinze Rivadeneira/Extender: Jeri Cos Weeks in Treatment: 0 Edema Assessment Assessed: [Left: No] [Right: No] [Left: Edema] [Right: :] Calf Left: Right: Point of Measurement: 38 cm From Medial Instep 40 cm 40 cm Ankle Left: Right: Point of Measurement: 12 cm From Medial Instep 25 cm 25 cm Knee To Floor Left: Right: From Medial Instep 48 cm 48 cm Vascular Assessment Pulses: Dorsalis Pedis Palpable: [Left:Yes] [Right:Yes] Doppler Audible: [Left:Yes] [Right:Yes] Blood Pressure: Brachial: [Left:181] [Right:181] Ankle: [Left:Dorsalis Pedis: 125 0.69] [Right:Dorsalis Pedis: 150 0.83] Electronic Signature(s) Signed: 08/21/2022 12:25:01 PM By: Carlene Coria RN Entered By: Carlene Coria on 08/17/2022 09:28:30 [name], [name] ([id number]) -------------------------------------------------------------------------------- Multi Wound Chart Details Patient Name: [name] Date of Service: 08/17/2022 8:45 AM Medical Record Number: [id number] Patient Account Number: [id number] Date of  Birth/Sex: [birth year] (41 y.o. Male) Treating RN: Carlene Coria Primary Care Shimon Trowbridge: Crissie Figures Other Clinician: Referring Daren Yeagle: Thomasene Ripple Treating Rodarius Kichline/Extender: Jeri Cos Weeks in Treatment: 0 Vital Signs Height(in): 73 Pulse(bpm): 93 Weight(lbs): 230 Blood Pressure(mmHg): 181/96 Body Mass Index(BMI): 30.3 Temperature(F): 97.9 Respiratory Rate(breaths/min): 18 Photos: Wound Location: Right, Lateral Lower Leg Right, Medial Lower Leg Left, Medial Lower Leg Wounding Event: Other Lesion Other Lesion Other Lesion Primary Etiology: Atypical Atypical Atypical Date Acquired: 11/09/2021 11/09/2021 11/09/2021 Weeks of Treatment: 0 0 0 Wound Status: Open Open Open Wound Recurrence: No No No Clustered Wound: Yes Yes Yes Measurements L x W x D (cm) 6.2x3.5x0.2 6.5x2x0.2 5x10x0.2 Area (cm) : 17.043 10.21 39.27  Volume (cm) : 3.409 2.042 7.854 Classification: Full Thickness Without Exposed Full Thickness Without Exposed Full Thickness Without Exposed Support Structures Support Structures Support Structures Exudate Amount: Medium Medium Medium Exudate Type: Serosanguineous Serosanguineous Serosanguineous Exudate Color: red, brown red, brown red, brown Granulation Amount: Medium (34-66%) Medium (34-66%) Medium (34-66%) Granulation Quality: Pink Red, Pink Red, Pink Necrotic Amount: Medium (34-66%) Medium (34-66%) Medium (34-66%) Necrotic Tissue: Adherent [place] Exposed Structures: Fat Layer (Subcutaneous Tissue): Fat Layer (Subcutaneous Tissue): Fat Layer (Subcutaneous Tissue): Yes Yes Yes Fascia: No Fascia: No Fascia: No Tendon: No Tendon: No Tendon: No Muscle: No Muscle: No Muscle: No Joint: No Joint: No Joint: No Bone: No Bone: No Bone: No Epithelialization: None None None Wound Number: _0 Photos: Wound Location: Left, Lateral Lower Leg Right, Circumferential Forearm Left, Circumferential Forearm Wounding  Event: Other Lesion Other Lesion Other Lesion Charlot, [name] ([id number]) Primary Etiology: Atypical Atypical Atypical Date Acquired: 11/09/2021 11/09/2021 11/09/2021 Weeks of Treatment: 0 0 0 Wound Status: Open Open Open Wound Recurrence: No No No Clustered Wound: Yes Yes Yes Measurements L x W x D (cm) 8x5.5x0.2 18x37x0.2 17x34x0.2 Area (cm) : 34.558 523.075 453.96 Volume (cm) : 6.912 104.615 90.792 Classification: Full Thickness Without Exposed Full Thickness Without Exposed Full Thickness Without Exposed Support Structures Support Structures Support Structures Exudate Amount: Medium Medium Medium Exudate Type: Serosanguineous Serosanguineous Serosanguineous Exudate Color: red, brown red, brown red, brown Granulation Amount: Medium (34-66%) Medium (34-66%) Medium (34-66%) Granulation Quality: Red, Pink Red, Pink Red, Pink Necrotic Amount: Medium (34-66%) Medium (34-66%) Medium (34-66%) Necrotic Tissue: Eschar, Adherent [place] Exposed Structures: Fat Layer (Subcutaneous Tissue): Fat Layer (Subcutaneous Tissue): Fat Layer (Subcutaneous Tissue): Yes Yes Yes Fascia: No Fascia: No Fascia: No Tendon: No Tendon: No Tendon: No Muscle: No Muscle: No Muscle: No Joint: No Joint: No Joint: No Bone: No Bone: No Bone: No Epithelialization: None None None Treatment Notes Electronic Signature(s) Signed: 08/21/2022 12:25:01 PM By: Carlene Coria RN Entered By: Carlene Coria on 08/17/2022 09:55:08 Benscoter, [name] ([id number]) -------------------------------------------------------------------------------- Multi-Disciplinary Care Plan Details Patient Name: [name] Date of Service: 08/17/2022 8:45 AM Medical Record Number: [id number] Patient Account Number: [id number] Date of Birth/Sex: Mar 23, 1981 (41 y.o. Male) Treating RN: Carlene Coria Primary Care Raijon Lindfors: Crissie Figures Other Clinician: Referring Judith Campillo: Thomasene Ripple Treating  Dayelin Balducci/Extender: Skipper Cliche in Treatment: 0 Active Inactive Necrotic Tissue Nursing Diagnoses: Knowledge deficit related to management of necrotic/devitalized tissue Goals: Necrotic/devitalized tissue will be minimized in the wound bed Date Initiated: 08/17/2022 Target Resolution Date: 09/17/2022 Goal Status: Active Patient/caregiver will verbalize understanding of reason and process for debridement of necrotic tissue Date Initiated: 08/17/2022 Target Resolution Date: 10/17/2022 Goal Status: Active Interventions: Assess patient pain level pre-, during and post procedure and prior to discharge Provide education on necrotic tissue and debridement process Notes: Wound/Skin Impairment Nursing Diagnoses: Knowledge deficit related to ulceration/compromised skin integrity Goals: Patient/caregiver will verbalize understanding of skin care regimen Date Initiated: 08/17/2022 Target Resolution Date: 09/17/2022 Goal Status: Active Ulcer/skin breakdown will have a volume reduction of 30% by week 4 Date Initiated: 08/17/2022 Target Resolution Date: 09/17/2022 Goal Status: Active Ulcer/skin breakdown will have a volume reduction of 50% by week 8 Date Initiated: 08/17/2022 Target Resolution Date: 10/17/2022 Goal Status: Active Ulcer/skin breakdown will have a volume reduction of 80% by week 12 Date Initiated: 08/17/2022 Target Resolution Date: 11/17/2022 Goal Status: Active Ulcer/skin breakdown will heal within 14 weeks Date Initiated: 08/17/2022 Target Resolution Date: 12/18/2022 Goal  Status: Active Interventions: Assess patient/caregiver ability to obtain necessary supplies Assess patient/caregiver ability to perform ulcer/skin care regimen upon admission and as needed Assess ulceration(s) every visit Notes: Electronic Signature(s) Signed: 08/21/2022 12:25:01 PM By: Carlene Coria RN Entered By: Carlene Coria on 08/17/2022 09:54:47 [name], [name] ([id number]) [name], [name]  ([id number]) -------------------------------------------------------------------------------- Pain Assessment Details Patient Name: [name] Date of Service: 08/17/2022 8:45 AM Medical Record Number: [id number] Patient Account Number: [id number] Date of Birth/Sex: 1981-05-01 (41 y.o. Male) Treating RN: Carlene Coria Primary Care Jazia Faraci: Crissie Figures Other Clinician: Referring Korion Cuevas: Thomasene Ripple Treating Clee Pandit/Extender: Jeri Cos Weeks in Treatment: 0 Active Problems Location of Pain Severity and Description of Pain Patient Has Paino No Site Locations Pain Management and Medication Current Pain Management: Electronic Signature(s) Signed: 08/21/2022 12:25:01 PM By: Carlene Coria RN Entered By: Carlene Coria on 08/17/2022 09:07:10 [name] ([id number]) -------------------------------------------------------------------------------- Patient/Caregiver Education Details Patient Name: [name] Date of Service: 08/17/2022 8:45 AM Medical Record Number: [id number] Patient Account Number: [id number] Date of Birth/Gender: 09-Apr-1981 (41 y.o. Male) Treating RN: Carlene Coria Primary Care Physician: Crissie Figures Other Clinician: Referring Physician: Thomasene Ripple Treating Physician/Extender: Skipper Cliche in Treatment: 0 Education Assessment Education Provided To: Patient Education Topics Provided Wound Debridement: Methods: Explain/Verbal Responses: State content correctly Electronic Signature(s) Signed: 08/21/2022 12:25:01 PM By: Carlene Coria RN Entered By: Carlene Coria on 08/17/2022 10:46:22 [name], [name] ([id number]) -------------------------------------------------------------------------------- Wound Assessment Details Patient Name: [name] Date of Service: 08/17/2022 8:45 AM Medical Record Number: [id number] Patient Account Number: [id number] Date of Birth/Sex: 03-23-1981 (41 y.o. Male) Treating RN: Carlene Coria Primary Care Elektra Wartman: Crissie Figures Other Clinician: Referring Adri Schloss: Thomasene Ripple Treating Kemarion Abbey/Extender: Jeri Cos Weeks in Treatment: 0 Wound Status Wound Number: 1 Primary Etiology: Atypical Wound Location: Right, Lateral Lower Leg Wound Status: Open Wounding Event: Other Lesion Date Acquired: 11/09/2021 Weeks Of Treatment: 0 Clustered Wound: No Photos Wound Measurements Length: (cm) 6.2 Width: (cm) 3.5 Depth: (cm) 0.2 Area: (cm) 17.043 Volume: (cm) 3.409 % Reduction in Area: % Reduction in Volume: Epithelialization: None Tunneling: No Undermining: No Wound Description Classification: Full Thickness Without Exposed Support Structu Exudate Amount: Medium Exudate Type: Serosanguineous Exudate Color: red, brown res Foul Odor After Cleansing: No Slough/Fibrino Yes Wound Bed Granulation Amount: Medium (34-66%) Exposed Structure Granulation Quality: Pink Fascia Exposed: No Necrotic Amount: Medium (34-66%) Fat Layer (Subcutaneous Tissue) Exposed: Yes Necrotic Quality: Adherent Slough Tendon Exposed: No Muscle Exposed: No Joint Exposed: No Bone Exposed: No Electronic Signature(s) Signed: 08/17/2022 9:34:21 AM By: Carlene Coria RN Entered By: Carlene Coria on 08/17/2022 09:34:21 [name], [name] ([id number]) -------------------------------------------------------------------------------- Wound Assessment Details Patient Name: [name] Date of Service: 08/17/2022 8:45 AM Medical Record Number: [id number] Patient Account Number: [id number] Date of Birth/Sex: Apr 26, 1981 (41 y.o. Male) Treating RN: Carlene Coria Primary Care Raphel Stickles: Crissie Figures Other Clinician: Referring Marieanne Marxen: Thomasene Ripple Treating Kazuto Sevey/Extender: Jeri Cos Weeks in Treatment: 0 Wound Status Wound Number: 2 Primary Etiology: Atypical Wound Location: Right, Medial Lower Leg Wound Status: Open Wounding Event: Other Lesion Date Acquired: 11/09/2021 Weeks Of Treatment: 0 Clustered Wound: No Photos Wound  Measurements Length: (cm) 6.5 Width: (cm) 2 Depth: (cm) 0.2 Area: (cm) 10.21 Volume: (cm) 2.042 % Reduction in Area: % Reduction in Volume: Epithelialization: None Tunneling: No Undermining: No Wound Description Classification: Full Thickness Without Exposed Support Structu Exudate Amount: Medium Exudate Type: Serosanguineous Exudate Color: red, brown res Foul Odor After Cleansing: No Slough/Fibrino Yes Wound Bed Granulation Amount: Medium (34-66%) Exposed Structure Granulation Quality: Red, Pink Fascia Exposed: No Necrotic Amount: Medium (  34-66%) Fat Layer (Subcutaneous Tissue) Exposed: Yes Necrotic Quality: Eschar, Adherent Slough Tendon Exposed: No Muscle Exposed: No Joint Exposed: No Bone Exposed: No Electronic Signature(s) Signed: 08/17/2022 9:35:26 AM By: Carlene Coria RN Entered By: Carlene Coria on 08/17/2022 09:35:25 [name], [name] ([id number]) -------------------------------------------------------------------------------- Wound Assessment Details Patient Name: [name] Date of Service: 08/17/2022 8:45 AM Medical Record Number: [id number] Patient Account Number: [id number] Date of Birth/Sex: 1980-11-26 (41 y.o. Male) Treating RN: Carlene Coria Primary Care Kalise Fickett: Crissie Figures Other Clinician: Referring Cherisse Carrell: Thomasene Ripple Treating Doniven Vanpatten/Extender: Jeri Cos Weeks in Treatment: 0 Wound Status Wound Number: 3 Primary Etiology: Atypical Wound Location: Left, Medial Lower Leg Wound Status: Open Wounding Event: Other Lesion Date Acquired: 11/09/2021 Weeks Of Treatment: 0 Clustered Wound: No Photos Wound Measurements Length: (cm) 5 Width: (cm) 10 Depth: (cm) 0.2 Area: (cm) 39.27 Volume: (cm) 7.854 % Reduction in Area: % Reduction in Volume: Epithelialization: None Tunneling: No Undermining: No Wound Description Classification: Full Thickness Without Exposed Support Structu Exudate Amount: Medium Exudate Type: Serosanguineous Exudate  Color: red, brown res Foul Odor After Cleansing: No Slough/Fibrino Yes Wound Bed Granulation Amount: Medium (34-66%) Exposed Structure Granulation Quality: Red, Pink Fascia Exposed: No Necrotic Amount: Medium (34-66%) Fat Layer (Subcutaneous Tissue) Exposed: Yes Necrotic Quality: Eschar, Adherent Slough Tendon Exposed: No Muscle Exposed: No Joint Exposed: No Bone Exposed: No Electronic Signature(s) Signed: 08/17/2022 9:36:34 AM By: Carlene Coria RN Entered By: Carlene Coria on 08/17/2022 09:36:34 Maciver, [name] ([id number]) -------------------------------------------------------------------------------- Wound Assessment Details Patient Name: [name] Date of Service: 08/17/2022 8:45 AM Medical Record Number: [id number] Patient Account Number: [id number] Date of Birth/Sex: Dec 08, 1980 (41 y.o. Male) Treating RN: Carlene Coria Primary Care Ladana Chavero: Crissie Figures Other Clinician: Referring Lace Chenevert: Thomasene Ripple Treating Kaide Gage/Extender: Jeri Cos Weeks in Treatment: 0 Wound Status Wound Number: 4 Primary Etiology: Atypical Wound Location: Left, Lateral Lower Leg Wound Status: Open Wounding Event: Other Lesion Date Acquired: 11/09/2021 Weeks Of Treatment: 0 Clustered Wound: Yes Photos Wound Measurements Length: (cm) 8 Width: (cm) 5.5 Depth: (cm) 0.2 Area: (cm) 34.558 Volume: (cm) 6.912 % Reduction in Area: % Reduction in Volume: Epithelialization: None Tunneling: No Undermining: No Wound Description Classification: Full Thickness Without Exposed Support Structures Exudate Amount: Medium Exudate Type: Serosanguineous Exudate Color: red, brown Foul Odor After Cleansing: No Slough/Fibrino Yes Wound Bed Granulation Amount: Medium (34-66%) Exposed Structure Granulation Quality: Red, Pink Fascia Exposed: No Necrotic Amount: Medium (34-66%) Fat Layer (Subcutaneous Tissue) Exposed: Yes Necrotic Quality: Eschar, Adherent Slough Tendon Exposed: No Muscle Exposed:  No Joint Exposed: No Bone Exposed: No Treatment Notes Wound #4 (Lower Leg) Wound Laterality: Left, Lateral Cleanser Byram Ancillary Kit - 15 Day Supply Discharge Instruction: Use supplies as instructed; Kit contains: (15) Saline Bullets; (15) 3x3 Gauze; 15 pr Gloves Soap and Water Discharge Instruction: Gently cleanse wound with antibacterial soap, rinse and pat dry prior to dressing wounds Reino, Hilario ([id number]) Peri-Wound Care Topical Activon Honey Gel, 25 (g) Tube Primary Dressing Xeroform 4x4-HBD (in/in) Discharge Instruction: Apply Xeroform 4x4-HBD (in/in) as directed Secondary Dressing ABD Pad 5x9 (in/in) Discharge Instruction: Cover with ABD pad Secured With Medipore Tape - 48M Medipore H Soft Cloth Surgical Tape, 2x2 (in/yd) Kerlix Roll Sterile or Non-Sterile 6-ply 4.5x4 (yd/yd) Discharge Instruction: Apply Kerlix as directed Compression Wrap Compression Stockings Add-Ons Electronic Signature(s) Signed: 08/17/2022 9:38:24 AM By: Carlene Coria RN Entered By: Carlene Coria on 08/17/2022 09:38:23 Yordy, [name] ([id number]) -------------------------------------------------------------------------------- Wound Assessment Details Patient Name: [name] Date of Service: 08/17/2022 8:45 AM Medical Record Number: [id number] Patient Account Number: [id number] Date  of Birth/Sex: [birth year] (41 y.o. Male) Treating RN: Carlene Coria Primary Care Chasitie Passey: Crissie Figures Other Clinician: Referring Zarria Towell: Thomasene Ripple Treating Annalysa Mohammad/Extender: Jeri Cos Weeks in Treatment: 0 Wound Status Wound Number: 5 Primary Etiology: Atypical Wound Location: Right, Circumferential Forearm Wound Status: Open Wounding Event: Other Lesion Date Acquired: 11/09/2021 Weeks Of Treatment: 0 Clustered Wound: Yes Photos Wound Measurements Length: (cm) 18 Width: (cm) 37 Depth: (cm) 0.2 Area: (cm) 523.075 Volume: (cm) 104.615 % Reduction in Area: % Reduction in  Volume: Epithelialization: None Tunneling: No Undermining: No Wound Description Classification: Full Thickness Without Exposed Support Structures Exudate Amount: Medium Exudate Type: Serosanguineous Exudate Color: red, brown Foul Odor After Cleansing: No Slough/Fibrino Yes Wound Bed Granulation Amount: Medium (34-66%) Exposed Structure Granulation Quality: Red, Pink Fascia Exposed: No Necrotic Amount: Medium (34-66%) Fat Layer (Subcutaneous Tissue) Exposed: Yes Necrotic Quality: Eschar, Adherent Slough Tendon Exposed: No Muscle Exposed: No Joint Exposed: No Bone Exposed: No Treatment Notes Wound #5 (Forearm) Wound Laterality: Right, Circumferential Cleanser Byram Ancillary Kit - 15 Day Supply Discharge Instruction: Use supplies as instructed; Kit contains: (15) Saline Bullets; (15) 3x3 Gauze; 15 pr Gloves Soap and Water Discharge Instruction: Gently cleanse wound with antibacterial soap, rinse and pat dry prior to dressing wounds Catarino, Tarl ([id number]) Peri-Wound Care Topical Activon Honey Gel, 25 (g) Tube Primary Dressing Xeroform 4x4-HBD (in/in) Discharge Instruction: Apply Xeroform 4x4-HBD (in/in) as directed Secondary Dressing ABD Pad 5x9 (in/in) Discharge Instruction: Cover with ABD pad Secured With Medipore Tape - 37M Medipore H Soft Cloth Surgical Tape, 2x2 (in/yd) Kerlix Roll Sterile or Non-Sterile 6-ply 4.5x4 (yd/yd) Discharge Instruction: Apply Kerlix as directed Compression Wrap Compression Stockings Add-Ons Electronic Signature(s) Signed: 08/17/2022 9:39:43 AM By: Carlene Coria RN Entered By: Carlene Coria on 08/17/2022 09:39:42 Hepburn, [name] ([id number]) -------------------------------------------------------------------------------- Wound Assessment Details Patient Name: [name] Date of Service: 08/17/2022 8:45 AM Medical Record Number: [id number] Patient Account Number: [id number] Date of Birth/Sex: 15-May-1981 (41 y.o. Male) Treating RN: Carlene Coria Primary Care Euretha Najarro: Crissie Figures Other Clinician: Referring Brixon Zhen: Thomasene Ripple Treating Betsie Peckman/Extender: Jeri Cos Weeks in Treatment: 0 Wound Status Wound Number: 6 Primary Etiology: Atypical Wound Location: Left, Circumferential Forearm Wound Status: Open Wounding Event: Other Lesion Date Acquired: 11/09/2021 Weeks Of Treatment: 0 Clustered Wound: Yes Photos Wound Measurements Length: (cm) 17 Width: (cm) 34 Depth: (cm) 0.2 Area: (cm) 453.96 Volume: (cm) 90.792 % Reduction in Area: % Reduction in Volume: Epithelialization: None Tunneling: No Undermining: No Wound Description Classification: Full Thickness Without Exposed Support Structures Exudate Amount: Medium Exudate Type: Serosanguineous Exudate Color: red, brown Foul Odor After Cleansing: No Slough/Fibrino Yes Wound Bed Granulation Amount: Medium (34-66%) Exposed Structure Granulation Quality: Red, Pink Fascia Exposed: No Necrotic Amount: Medium (34-66%) Fat Layer (Subcutaneous Tissue) Exposed: Yes Necrotic Quality: Eschar, Adherent Slough Tendon Exposed: No Muscle Exposed: No Joint Exposed: No Bone Exposed: No Treatment Notes Wound #6 (Forearm) Wound Laterality: Left, Circumferential Cleanser Byram Ancillary Kit - 15 Day Supply Discharge Instruction: Use supplies as instructed; Kit contains: (15) Saline Bullets; (15) 3x3 Gauze; 15 pr Gloves Soap and Water Discharge Instruction: Gently cleanse wound with antibacterial soap, rinse and pat dry prior to dressing wounds Bewick, Acelin ([id number]) Peri-Wound Care Topical Activon Honey Gel, 25 (g) Tube Primary Dressing Xeroform 4x4-HBD (in/in) Discharge Instruction: Apply Xeroform 4x4-HBD (in/in) as directed Secondary Dressing ABD Pad 5x9 (in/in) Discharge Instruction: Cover with ABD pad Secured With Medipore Tape - 37M Medipore H Soft Cloth Surgical Tape, 2x2 (in/yd) Kerlix Roll Sterile or Non-Sterile 6-ply 4.5x4 (yd/yd) Discharge  Instruction: Apply Kerlix as directed Compression Wrap Compression Stockings Add-Ons Electronic Signature(s) Signed: 08/17/2022 9:41:10 AM By: Carlene Coria RN Entered By: Carlene Coria on 08/17/2022 09:41:09 Abbett, [name] ([id number]) -------------------------------------------------------------------------------- Vitals Details Patient Name: [name] Date of Service: 08/17/2022 8:45 AM Medical Record Number: [id number] Patient Account Number: [id number] Date of Birth/Sex: 09/11/1981 (41 y.o. Male) Treating RN: Carlene Coria Primary Care Cayleigh Paull: Crissie Figures Other Clinician: Referring Danecia Underdown: Thomasene Ripple Treating Bertine Schlottman/Extender: Skipper Cliche in Treatment: 0 Vital Signs Time Taken: 09:07 Temperature (F): 97.9 Height (in): 73 Pulse (bpm): 93 Source: Stated Respiratory Rate (breaths/min): 18 Weight (lbs): 230 Blood Pressure (mmHg): 181/96 Source: Stated Reference Range: 80 - 120 mg / dl Body Mass Index (BMI): 30.3 Electronic Signature(s) Signed: 08/21/2022 12:25:01 PM By: Carlene Coria RN Entered By: Carlene Coria on 08/17/2022 09:07:39

## 2022-08-24 ENCOUNTER — Encounter: Payer: Medicare PPO | Admitting: Physician Assistant

## 2022-08-24 DIAGNOSIS — L97812 Non-pressure chronic ulcer of other part of right lower leg with fat layer exposed: Secondary | ICD-10-CM | POA: Diagnosis not present

## 2022-08-24 DIAGNOSIS — L03114 Cellulitis of left upper limb: Secondary | ICD-10-CM | POA: Diagnosis not present

## 2022-08-24 DIAGNOSIS — L98492 Non-pressure chronic ulcer of skin of other sites with fat layer exposed: Secondary | ICD-10-CM | POA: Diagnosis not present

## 2022-08-24 DIAGNOSIS — L97822 Non-pressure chronic ulcer of other part of left lower leg with fat layer exposed: Secondary | ICD-10-CM | POA: Diagnosis not present

## 2022-08-24 DIAGNOSIS — L03116 Cellulitis of left lower limb: Secondary | ICD-10-CM | POA: Diagnosis not present

## 2022-08-24 DIAGNOSIS — L03115 Cellulitis of right lower limb: Secondary | ICD-10-CM | POA: Diagnosis not present

## 2022-08-24 DIAGNOSIS — L03113 Cellulitis of right upper limb: Secondary | ICD-10-CM | POA: Diagnosis not present

## 2022-08-24 DIAGNOSIS — F19959 Other psychoactive substance use, unspecified with psychoactive substance-induced psychotic disorder, unspecified: Secondary | ICD-10-CM | POA: Diagnosis not present

## 2022-08-24 NOTE — Progress Notes (Signed)
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 Name: Date of Service: ELSTON, ALDAPE 08/24/2022 10:00 [place] Number: [id number] Patient Account Number: [id number] Date of Birth/Sex: Treating [name]: Jul 11, 1981 (41 y.o. [name] Primary Care Aloise Copus: Crissie Figures Other Clinician: Referring Dayrin Stallone: Treating Shaolin Armas/Extender: [name] Weeks in Treatment: 1 Wound Status Wound Number: 6 Primary Etiology: Atypical Wound Location: Left, Circumferential Forearm Wound Status: Open Wounding Event: Other Lesion Date Acquired: 11/09/2021 Weeks Of Treatment: 1 Clustered Wound: Yes Photos Hrdlicka, [name] ([id number]) [id number] Page 14 of 15 Photo Uploaded By: [name], BSN, [name], [name], Kirk on 08/24/2022 10:27:22 Wound Measurements Length: (cm) 12 Width: (cm) 18 Depth: (cm) 0.2 Area: (cm) 169.646 Volume: (cm) 33.929 % Reduction in Area: 62.6% % Reduction in Volume: 62.6% Wound Description Classification: Full Thickness Without Exposed Support Exudate Amount: Medium Exudate Type: Serosanguineous Exudate Color: red, brown Structures Treatment Notes Wound #6 (Forearm) Wound Laterality: Left, Circumferential Cleanser Byram Ancillary Kit - 15 Day Supply Discharge Instruction: Use supplies as instructed; Kit contains: (15) Saline Bullets; (15) 3x3 Gauze; 15 pr Gloves Soap and Water Discharge Instruction: Gently cleanse wound with antibacterial soap, rinse and pat dry prior to dressing wounds Peri-Wound Care Topical Activon Honey  Gel, 25 (g) Tube Primary Dressing Xeroform 4x4-HBD (in/in) Discharge Instruction: Apply Xeroform 4x4-HBD (in/in) as directed Secondary Dressing ABD Pad 5x9 (in/in) Discharge Instruction: Cover with ABD pad Secured With Medipore T - 36M Medipore H Soft Cloth Surgical T ape ape, 2x2 (in/yd) Kerlix Roll Sterile or Non-Sterile 6-ply 4.5x4 (yd/yd) Discharge Instruction: Apply Kerlix as directed Compression Wrap Compression Stockings Add-Ons Electronic Signature(s) Signed: 08/24/2022 12:28:09 PM By: [name], BSN, [name], [name], [name], [name] Entered By: [name], BSN, [name], [name], Kirk on 08/24/2022 10:17:20 Valone, [name] ([id number]) [id number] Page 15 of 15 -------------------------------------------------------------------------------- Vitals Details Patient Name: Date of Service: FORNEY, KLEINPETER 08/24/2022 10:00 [place] Record Number: [id number] Patient Account Number: [id number] Date of Birth/Sex: Treating [name]: Apr 09, 1981 (41 y.o. [name], [name] Primary Care Chino Sardo: Crissie Figures Other Clinician: Referring Kivon Aprea: Treating Fawna Cranmer/Extender: [name] Weeks in Treatment: 1 Vital Signs Time Taken: 10:08 Temperature (F): 97.9 Height (in): 73 Pulse (bpm): 99 Weight (lbs): 230 Respiratory Rate (breaths/min): 16 Body Mass Index (BMI): 30.3 Blood Pressure (mmHg): 139/91 Reference Range: 80 - 120 mg / dl Electronic Signature(s) Signed: 08/24/2022 12:28:09 PM By: [name], BSN, [name], [name], [name], [name] Entered By: [name], BSN, [name], [name], Kirk on 08/24/2022 10:08:30

## 2022-08-24 NOTE — Progress Notes (Addendum)
KABE, MCKOY ([id number]) [id number] Page 1 of 12 Visit Report for 08/24/2022 Chief Complaint Document Details Patient Name: Date of Service: [name], [name] 08/24/2022 10:00 [place] Record Number: [id number] Patient Account Number: [id number] Date of Birth/Sex: Treating RN: Apr 11, 1981 (41 y.o. 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 [name] Primary Care Provider: Crissie [name] Other Clinician: Referring Provider: Treating Provider/Extender: [name] Weeks in Treatment: 1 Debridement Performed for Assessment: Wound #5 Right,Circumferential Forearm Performed By: Physician Kirk Sams., PA-C Debridement Type: Debridement Level of Consciousness (Pre-procedure): Awake and Alert Pre-procedure Verification/Time Out Yes - 10:28 Taken: T Area Debrided (L x W): otal 3 (cm) x 5 (cm) = 15 (cm) Tissue and other material debrided: Viable, Non-Viable, Eschar, Slough, Subcutaneous, Biofilm, Slough Level: Skin/Subcutaneous Tissue Debridement Description: Excisional Instrument: Forceps Bleeding: Minimum Hemostasis Achieved: Pressure Response to Treatment: Procedure was tolerated well Level of Consciousness (Post- Awake and Alert procedure): Post Debridement Measurements  of Total Wound [place], [name] ([id number]) [id number] Page 2 of 12 Length: (cm) 17 Width: (cm) 18 Depth: (cm) 0.2 Volume: (cm) 48.066 Character of Wound/Ulcer Post Debridement: Stable Post Procedure Diagnosis Same as Pre-procedure Electronic Signature(s) Signed: 08/24/2022 12:28:09 PM By: [name], BSN, RN, CWS, [name] RN, BSN Signed: 08/24/2022 2:02:33 PM By: Kirk Keeler PA-C Entered By: [name], BSN, RN, CWS, [name] on 08/24/2022 10:28:58 -------------------------------------------------------------------------------- Debridement Details Patient Name: Date of Service: [name], [name] 08/24/2022 10:00 [place] Number: [id number] Patient Account Number: [id number] Date of Birth/Sex: Treating RN: 27-Feb-1981 (41 y.o. [name], [name] Primary Care Provider: Crissie [name] Other Clinician: Referring Provider: Treating Provider/Extender: [name] Weeks in Treatment: 1 Debridement Performed for Assessment: Wound #6 Left,Circumferential Forearm Performed By: Physician Kirk Sams., PA-C Debridement Type: Debridement Level of Consciousness (Pre-procedure): Awake and Alert Pre-procedure Verification/Time Out Yes - 10:28 Taken: T Area Debrided (L x W): otal 2 (cm) x 4 (cm) = 8 (cm) Tissue and other material debrided: Viable, Non-Viable, Eschar, Slough, Subcutaneous, Biofilm, Slough Level: Skin/Subcutaneous Tissue Debridement Description: Excisional Instrument: Forceps Bleeding: Minimum Hemostasis Achieved: Pressure Response to Treatment: Procedure was tolerated well Level of Consciousness (Post- Awake and Alert procedure): Post Debridement Measurements of Total Wound Length: (cm) 12 Width: (cm) 18 Depth: (cm) 0.2 Volume: (cm) 33.929 Character of Wound/Ulcer Post Debridement: Stable Post Procedure Diagnosis Same as Pre-procedure Electronic Signature(s) Signed: 08/24/2022 12:28:09 PM By: [name], BSN, RN, CWS, [name] RN, BSN Signed:  08/24/2022 2:02:33 PM By: Kirk Keeler PA-C Entered By: [name], BSN, RN, CWS, [name] on 08/24/2022 10:45:10 [name] ([id number]) [id number] Page 3 of 12 -------------------------------------------------------------------------------- Debridement Details Patient Name: Date of Service: [name], [name] 08/24/2022 10:00 [place] Record Number: [id number] Patient Account Number: [id number] Date of Birth/Sex: Treating RN: 08/10/1981 (41 y.o. [name] Primary Care Provider: Crissie [name] Other Clinician: Referring Provider: Treating Provider/Extender: [name] Weeks in Treatment: 1 Debridement Performed for Assessment: Wound #1 Right,Lateral Lower Leg Performed By: Physician Kirk Sams., PA-C Debridement Type: Debridement Level of Consciousness (Pre-procedure): Awake  and Alert Pre-procedure Verification/Time Out Yes - 10:28 Taken: T Area Debrided (L x W): otal 2 (cm) x 3 (cm) = 6 (cm) Tissue and other material debrided: Viable, Non-Viable, Eschar, Slough, Subcutaneous, Biofilm, Slough Level: Skin/Subcutaneous Tissue Debridement Description: Excisional Instrument: Forceps Bleeding: Minimum Hemostasis Achieved: Pressure Response to Treatment: Procedure was tolerated well Level of Consciousness (Post- Awake and Alert procedure): Post Debridement Measurements of Total Wound Length: (cm) 5.5 Width: (cm) 2.5 Depth: (cm) 0.2 Volume: (cm) 2.16 Character of Wound/Ulcer Post Debridement: Stable Post Procedure Diagnosis Same as Pre-procedure Electronic Signature(s) Signed: 08/24/2022 12:28:09 PM By: [name], BSN, RN, CWS, [name] RN, BSN Signed: 08/24/2022 2:02:33 PM By: Kirk Keeler PA-C Entered By: [name], BSN, RN, CWS, [name] on 08/24/2022 10:45:40 -------------------------------------------------------------------------------- HPI Details Patient Name: Date of Service: [name], [name] 08/24/2022 10:00 [place] Number:  [id number] Patient Account Number: [id number] Date of Birth/Sex: Treating RN: 10-27-1981 (41 y.o. [name] Primary Care Provider: Crissie [name] Other Clinician: Referring Provider: Treating Provider/Extender: [name] Weeks in Treatment: 1 [place], [name] ([id number]) [id number] Page 4 of 12 History of Present Illness HPI Description: 08-17-2022 upon evaluation today patient has multiple wounds over the bilateral arms and lower extremities. 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 He tells me that he has had the wounds on the legs for quite some time and he has not used in that area in the past 6 months. Nonetheless he also has not been able to get him to heal based on what he tells me. He does tell me though that he continues to use and inject the fentanyl although last time was a week ago. It has been discussed with him when he was in the hospital going into rehabilitation. With that being said he tells me the last time he did this that it really was not good and really was not helpful. Nonetheless I do believe this is still something that needs to be strongly considered and I had a really good conversation with him today in this regard. I discussed that further and the plan. In regard to the wounds themselves the patient unfortunately is having a lot of issues with necrotic eschar which is really tightly adhered in a lot of places. I do believe that he has sufficient blood flow to heal though I think this is just where the tissue honestly which just destroyed the paren from infiltrating fentanyl when he was injecting. This is good to be more difficult to heal due to the deeper damage and I discussed that with him today as well he is also had cellulitis although that looks to be resolving to me at this point. The good news is he really does not have any other major medical problems other than  substance abuse. 08-24-2022 upon evaluation today patient appears to be doing well currently in regard to his wounds in fact he is actually showing signs of significant improvement a lot of the eschar as he is actually pulled off which I think have made a big difference. With that being said I think that we do need to try to debride away some of the areas today and he is in agreement with that plan. Electronic Signature(s) Signed: 08/24/2022 7:56:09 AM By: Kirk Keeler PA-C Entered By: [name] on 08/24/2022 10:56:08 -------------------------------------------------------------------------------- Physical Exam Details Patient Name: Date of Service: [name], [name] 08/24/2022 10:00 [place] Record Number: [id number] Patient Account Number: [id number] Date of Birth/Sex: Treating  RN: [birth year] (41 y.o. [name] Primary Care Provider: Crissie [name] Other Clinician: Referring Provider: Treating Provider/Extender: [name] Weeks in Treatment: 1 Constitutional Well-nourished and well-hydrated in no acute distress. Respiratory normal breathing without difficulty. Psychiatric this patient is able to make decisions and demonstrates good insight into disease process. Alert and Oriented x 3. pleasant and cooperative. Notes Upon inspection patient's wound bed actually showed signs of good granulation and epithelization at this point. Fortunately I do not see any signs of infection locally or systemically which is great news. I think that some of the eschar areas on both arms of the right leg need to be cleaned away and I did discuss that with him today. Electronic Signature(s) Signed: 08/24/2022 7:56:30 AM By: Kirk Keeler PA-C Entered By: [name] on 08/24/2022 10:56:30 [place], [name] ([id number]) [id number] Page 5 of 12 -------------------------------------------------------------------------------- Physician Orders  Details Patient Name: Date of Service: [name], [name] 08/24/2022 10:00 [place] Record Number: [id number] Patient Account Number: [id number] Date of Birth/Sex: Treating RN: November 13, 1980 (41 y.o. 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 Bathing/ Shower/ Hygiene May shower; gently cleanse wound with antibacterial soap, rinse and pat dry prior to dressing wounds Edema Control - Lymphedema / Segmental Compressive Device / Other Elevate, Exercise Daily and A void Standing for Long Periods of Time. Elevate legs to the level of the heart and pump ankles as often as possible Elevate leg(s) parallel to the floor when sitting. Wound Treatment Wound #1 - Lower Leg Wound Laterality: Right, Lateral Cleanser: [employer] Ancillary Kit - 15 Day Supply (Generic) 3 x Per Week/30 Days Discharge Instructions: Use supplies as instructed; Kit contains: (15) Saline Bullets; (15) 3x3 Gauze; 15 pr Gloves Cleanser: Soap and Water 3 x Per Week/30 Days Discharge Instructions: Gently cleanse wound with antibacterial soap, rinse and pat dry prior to dressing wounds Topical: Activon Honey Gel, 25 (g) Tube 3 x Per Week/30 Days Prim  Dressing: Xeroform 4x4-HBD (in/in) (Generic) 3 x Per Week/30 Days ary Discharge Instructions: Apply Xeroform 4x4-HBD (in/in) as directed Secondary Dressing: ABD Pad 5x9 (in/in) 3 x Per Week/30 Days Discharge Instructions: Cover with ABD pad Secured With: Medipore T - 40M Medipore H Soft Cloth Surgical T ape ape, 2x2 (in/yd) (Generic) 3 x Per Week/30 Days Secured With: [employer] Sterile or Non-Sterile 6-ply 4.5x4 (yd/yd) (Generic) 3 x Per Week/30 Days Discharge Instructions: Apply Kerlix as directed Wound #2 - Lower Leg Wound Laterality: Right, Medial Cleanser: [employer] Ancillary Kit - 15 Day Supply (Generic) 3 x Per Week/30 Days Discharge Instructions: Use supplies as instructed; Kit contains: (15) Saline Bullets; (15) 3x3 Gauze; 15 pr Gloves Cleanser: Soap and Water 3 x Per Week/30 Days Discharge Instructions: Gently cleanse wound with antibacterial soap, rinse and pat dry prior to dressing wounds Galan, [name] ([id number]) [id number] Page 6 of 12 Topical:  Activon Honey Gel, 25 (g) Tube 3 x Per Week/30 Days Prim Dressing: Xeroform 4x4-HBD (in/in) (Generic) 3 x Per Week/30 Days ary Discharge Instructions: Apply Xeroform 4x4-HBD (in/in) as directed Secondary Dressing: ABD Pad 5x9 (in/in) 3 x Per Week/30 Days Discharge Instructions: Cover with ABD pad Secured With: Medipore T - 80M Medipore H Soft Cloth Surgical T ape ape, 2x2 (in/yd) (Generic) 3 x Per Week/30 Days Secured With: [employer] Sterile or Non-Sterile 6-ply 4.5x4 (yd/yd) (Generic) 3 x Per Week/30 Days Discharge Instructions: Apply Kerlix as directed Wound #3 - Lower Leg Wound Laterality: Left, Medial Cleanser: Byram Ancillary Kit - 15 Day Supply (Generic) 3 x Per Week/30 Days Discharge Instructions: Use supplies as instructed; Kit contains: (15) Saline Bullets; (15) 3x3 Gauze; 15 pr Gloves Cleanser: Soap and Water 3 x Per Week/30 Days Discharge Instructions: Gently cleanse wound with antibacterial soap,  rinse and pat dry prior to dressing wounds Topical: Activon Honey Gel, 25 (g) Tube 3 x Per Week/30 Days Prim Dressing: Xeroform 4x4-HBD (in/in) (Generic) 3 x Per Week/30 Days ary Discharge Instructions: Apply Xeroform 4x4-HBD (in/in) as directed Secondary Dressing: ABD Pad 5x9 (in/in) 3 x Per Week/30 Days Discharge Instructions: Cover with ABD pad Secured With: Medipore T - 80M Medipore H Soft Cloth Surgical T ape ape, 2x2 (in/yd) (Generic) 3 x Per Week/30 Days Secured With: [employer] Sterile or Non-Sterile 6-ply 4.5x4 (yd/yd) (Generic) 3 x Per Week/30 Days Discharge Instructions: Apply Kerlix as directed Wound #4 - Lower Leg Wound Laterality: Left, Lateral Cleanser: Byram Ancillary Kit - 15 Day Supply (Generic) 3 x Per Week/30 Days Discharge Instructions: Use supplies as instructed; Kit contains: (15) Saline Bullets; (15) 3x3 Gauze; 15 pr Gloves Cleanser: Soap and Water 3 x Per Week/30 Days Discharge Instructions: Gently cleanse wound with antibacterial soap, rinse and pat dry prior to dressing wounds Topical: Activon Honey Gel, 25 (g) Tube 3 x Per Week/30 Days Prim Dressing: Xeroform 4x4-HBD (in/in) (Generic) 3 x Per Week/30 Days ary Discharge Instructions: Apply Xeroform 4x4-HBD (in/in) as directed Secondary Dressing: ABD Pad 5x9 (in/in) 3 x Per Week/30 Days Discharge Instructions: Cover with ABD pad Secured With: Medipore T - 80M Medipore H Soft Cloth Surgical T ape ape, 2x2 (in/yd) (Generic) 3 x Per Week/30 Days Secured With: [employer] Sterile or Non-Sterile 6-ply 4.5x4 (yd/yd) (Generic) 3 x Per Week/30 Days Discharge Instructions: Apply Kerlix as directed Wound #5 - Forearm Wound Laterality: Right, Circumferential Cleanser: Byram Ancillary Kit - 15 Day Supply (Generic) 3 x Per Week/30 Days Discharge Instructions: Use supplies as instructed; Kit contains: (15) Saline Bullets; (15) 3x3 Gauze; 15 pr Gloves Cleanser: Soap and Water 3 x Per Week/30 Days Discharge Instructions: Gently  cleanse wound with antibacterial soap, rinse and pat dry prior to dressing wounds Topical: Activon Honey Gel, 25 (g) Tube 3 x Per Week/30 Days Prim Dressing: Xeroform 4x4-HBD (in/in) (Generic) 3 x Per Week/30 Days ary Discharge Instructions: Apply Xeroform 4x4-HBD (in/in) as directed Secondary Dressing: ABD Pad 5x9 (in/in) 3 x Per Week/30 Days Discharge Instructions: Cover with ABD pad Secured With: Medipore T - 80M Medipore H Soft Cloth Surgical T ape ape, 2x2 (in/yd) (Generic) 3 x Per Week/30 Days Secured With: [employer] Sterile or Non-Sterile 6-ply 4.5x4 (yd/yd) (Generic) 3 x Per Week/30 Days Discharge Instructions: Apply Kerlix as directed Wound #6 - Forearm Wound Laterality: Left, Circumferential Cleanser: [employer]ram Ancillary Kit - 15 Day Supply (Generic) 3 x Per Week/30 Days [name], [name] ([id number]) [id number] Page 7 of 12 Discharge Instructions: Use  supplies as instructed; Kit contains: (15) Saline Bullets; (15) 3x3 Gauze; 15 pr Gloves Cleanser: Soap and Water 3 x Per Week/30 Days Discharge Instructions: Gently cleanse wound with antibacterial soap, rinse and pat dry prior to dressing wounds Topical: Activon Honey Gel, 25 (g) Tube 3 x Per Week/30 Days Prim Dressing: Xeroform 4x4-HBD (in/in) (Generic) 3 x Per Week/30 Days ary Discharge Instructions: Apply Xeroform 4x4-HBD (in/in) as directed Secondary Dressing: ABD Pad 5x9 (in/in) 3 x Per Week/30 Days Discharge Instructions: Cover with ABD pad Secured With: Medipore T - 69M Medipore H Soft Cloth Surgical T ape ape, 2x2 (in/yd) (Generic) 3 x Per Week/30 Days Secured With: [employer] Sterile or Non-Sterile 6-ply 4.5x4 (yd/yd) (Generic) 3 x Per Week/30 Days Discharge Instructions: Apply Kerlix as directed Electronic Signature(s) Signed: 08/24/2022 12:28:09 PM By: [name], BSN, RN, CWS, [name] RN, BSN Signed: 08/24/2022 2:02:33 PM By: Kirk Keeler PA-C Entered By: [name], BSN, RN, CWS, [name] on 08/24/2022  10:46:25 -------------------------------------------------------------------------------- Problem List Details Patient Name: Date of Service: [name], [name] 08/24/2022 10:00 [place] Record Number: [id number] Patient Account Number: [id number] Date of Birth/Sex: Treating RN: 17-May-1981 (41 y.o. 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/07/2022 No Yes L98.492 Non-pressure chronic ulcer of skin of other sites with fat layer exposed 08/17/2022 No Yes L03.113 Cellulitis of right upper limb 08/17/2022 No Yes L03.114 Cellulitis of left upper limb 08/17/2022 No Yes Mangino, Stevan ([id number]) [id number] Page 8 of 12 L03.115 Cellulitis of right lower limb 08/17/2022 No Yes L03.116 Cellulitis of left lower limb 08/17/2022 No Yes Inactive Problems Resolved Problems Electronic Signature(s) Signed: 08/24/2022 10:19:38 AM By: Kirk Keeler PA-C Entered By: [name] on 08/24/2022 10:19:38 -------------------------------------------------------------------------------- Progress Note Details Patient Name: Date of Service: [name], [name] 08/24/2022 10:00 [place] Number: [id number] Patient Account Number: [id number] Date of Birth/Sex: Treating RN: 1981/03/01 (41 y.o. 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 He tells me that he has had the wounds on the legs for quite some time and he has not used in that area in the past 6 months. Nonetheless he also has not been able to get him to heal based on what he tells me. He does tell me though that he continues to use and inject the fentanyl although last time was a week ago. It has been discussed with him when he was in the hospital going into rehabilitation. With that being said he tells me the last time he did this that it really was not good and really was not helpful. Nonetheless I do believe this is still something that needs to be strongly considered and I had a really good conversation with him today in  this regard. I discussed that further and the plan. In regard to the wounds themselves the patient unfortunately is having a lot of issues with necrotic eschar which is really tightly adhered in a lot of places. I do believe that he has sufficient blood flow to heal though I think this is just where the tissue honestly which just destroyed the paren from infiltrating fentanyl when he was injecting. This is good to be more difficult to heal due to the deeper damage and I discussed that with him today as well he is also had cellulitis although that looks to be resolving to me at this point. The good news is he really does not have any other major medical problems other than substance abuse. 08-24-2022 upon evaluation today patient appears to be doing well currently in regard to his wounds in fact he is actually showing signs of significant improvement a lot of the eschar as he is actually pulled off which  I think have made a big difference. With that being said I think that we do need to try to debride away some of the areas today and he is in agreement with that plan. Objective Constitutional Well-nourished and well-hydrated in no acute distress. [name], [name] ([id number]) [id number] Page 9 of 12 Vitals Time Taken: 10:08 AM, Height: 73 in, Weight: 230 lbs, BMI: 30.3, Temperature: 97.9 F, Pulse: 99 bpm, Respiratory Rate: 16 breaths/min, Blood Pressure: 139/91 mmHg. Respiratory normal breathing without difficulty. Psychiatric this patient is able to make decisions and demonstrates good insight into disease process. Alert and Oriented x 3. pleasant and cooperative. General Notes: Upon inspection patient's wound bed actually showed signs of good granulation and epithelization at this point. Fortunately I do not see any signs of infection locally or systemically which is great news. I think that some of the eschar areas on both arms of the right leg need to be cleaned away and I did discuss that with him today. Integumentary (Hair, Skin) Wound #1 status is Open. Original cause of wound was Other Lesion. The date acquired was: 11/09/2021. The wound has been in treatment 1 weeks. The wound is located on the Right,Lateral Lower Leg. The wound measures 5.5cm length x 2.5cm width x 0.2cm depth; 10.799cm^2 area and 2.16cm^3 volume. There is a medium amount of serosanguineous drainage noted. Wound #2 status is Open. Original cause of wound was Other Lesion. The date acquired was: 11/09/2021. The wound has been in treatment 1 weeks. The wound is located on the Right,Medial Lower Leg. The wound measures 6cm length x 0.6cm width x 0.2cm depth; 2.827cm^2 area and 0.565cm^3 volume. There is a medium amount of serosanguineous drainage noted. Wound #3 status is Open. Original cause of wound was Other Lesion. The date acquired was: 11/09/2021. The wound has been in treatment 1 weeks. The  wound is located on the Left,Medial Lower Leg. The wound measures 4cm length x 7cm width x 0.2cm depth; 21.991cm^2 area and 4.398cm^3 volume. There is a medium amount of serosanguineous drainage noted. Wound #4 status is Open. Original cause of wound was Other Lesion. The date acquired was: 11/09/2021. The wound has been in treatment 1 weeks. The wound is located on the Left,Lateral Lower Leg. The wound measures 7cm length x 6.5cm width x 0.2cm depth; 35.736cm^2 area and 7.147cm^3 volume. There is a medium amount of serosanguineous drainage noted. Wound #5 status is Open. Original cause of wound was Other Lesion. The date acquired was: 11/09/2021. The wound has been in treatment 1 weeks.  The wound is located on the Right,Circumferential Forearm. The wound measures 17cm length x 18cm width x 0.2cm depth; 240.332cm^2 area and 48.066cm^3 volume. There is a medium amount of serosanguineous drainage noted. Wound #6 status is Open. Original cause of wound was Other Lesion. The date acquired was: 11/09/2021. The wound has been in treatment 1 weeks. The wound is located on the Left,Circumferential Forearm. The wound measures 12cm length x 18cm width x 0.2cm depth; 169.646cm^2 area and 33.929cm^3 volume. There is a medium amount of serosanguineous drainage noted. 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 There was a Excisional  Skin/Subcutaneous Tissue Debridement with a total area of 6 sq cm performed by Kirk Sams., PA-C. With the following instrument(s): Forceps to remove Viable and Non-Viable tissue/material. Material removed includes Eschar, Subcutaneous Tissue, Slough, and Biofilm. No specimens were taken. A time out was conducted at 10:28, prior to the start of the procedure. A Minimum amount of bleeding was controlled with Pressure. The procedure was tolerated well. Post Debridement Measurements: 5.5cm length x 2.5cm width x 0.2cm depth; 2.16cm^3 volume. Character of Wound/Ulcer Post Debridement is stable. Post procedure Diagnosis Wound #1: Same as Pre-Procedure Wound #5 Pre-procedure diagnosis of Wound #5 is an Atypical located on the Right,Circumferential Forearm . There was a Excisional Skin/Subcutaneous Tissue Debridement with a total area of 15 sq cm performed by Kirk Sams., PA-C. With the following instrument(s): Forceps to remove Viable and Non-Viable tissue/material. Material removed includes Eschar, Subcutaneous Tissue, Slough, and Biofilm. No specimens were taken. A time out was conducted at 10:28, prior to the start of the procedure. A Minimum amount of bleeding was controlled with Pressure. The procedure was tolerated well. Post Debridement Measurements: 17cm length x 18cm width x 0.2cm depth; 48.066cm^3 volume. Character of Wound/Ulcer Post Debridement is stable. Post procedure Diagnosis Wound #5: Same as Pre-Procedure Wound #6 Pre-procedure diagnosis of Wound #6 is an Atypical located on the Left,Circumferential Forearm . There was a Excisional Skin/Subcutaneous Tissue Debridement with a total area of 8 sq cm performed by Kirk Sams., PA-C. With the following instrument(s): Forceps to remove Viable and Non-Viable tissue/material. Material removed includes Eschar, Subcutaneous Tissue, Slough, and Biofilm. No specimens were taken. A time out was conducted at 10:28, prior to the start of the  procedure. A Minimum amount of bleeding was controlled with Pressure. The procedure was tolerated well. Post Debridement [name], [name] ([id number]) [id number] Page 10 of 12 Measurements: 12cm length x 18cm width x 0.2cm depth; 33.929cm^3 volume. Character of Wound/Ulcer Post Debridement is stable. Post procedure Diagnosis Wound #6: Same as Pre-Procedure Plan Follow-up Appointments: Return Appointment in 1 week. Bathing/ Shower/ Hygiene: May shower; gently cleanse wound with antibacterial soap, rinse and pat dry prior to dressing wounds Edema Control - Lymphedema / Segmental Compressive Device / Other: Elevate, Exercise Daily and Avoid Standing for Long Periods of Time. Elevate legs to the level of the heart and pump ankles as often as possible Elevate leg(s) parallel to the floor when sitting.  WOUND #1: - Lower Leg Wound Laterality: Right, Lateral Cleanser: [employer] Ancillary Kit - 15 Day Supply (Generic) 3 x Per Week/30 Days Discharge Instructions: Use supplies as instructed; Kit contains: (15) Saline Bullets; (15) 3x3 Gauze; 15 pr Gloves Cleanser: Soap and Water 3 x Per Week/30 Days Discharge Instructions: Gently cleanse wound with antibacterial soap, rinse and pat dry prior to dressing wounds Topical: Activon Honey Gel, 25 (g) Tube 3 x Per Week/30 Days Prim Dressing: Xeroform 4x4-HBD (in/in) (Generic) 3 x Per Week/30 Days ary Discharge Instructions: Apply Xeroform 4x4-HBD (in/in) as directed Secondary Dressing: ABD Pad 5x9 (in/in) 3 x Per Week/30 Days Discharge Instructions: Cover with ABD pad Secured With: Medipore T - 77M Medipore H Soft Cloth Surgical T ape ape, 2x2 (in/yd) (Generic) 3 x Per Week/30 Days Secured With: [employer] Sterile or Non-Sterile 6-ply 4.5x4 (yd/yd) (Generic) 3 x Per Week/30 Days Discharge Instructions: Apply Kerlix as directed WOUND #2: - Lower Leg Wound Laterality: Right, Medial Cleanser: [employer] Ancillary Kit - 15 Day Supply  (Generic) 3 x Per Week/30 Days Discharge Instructions: Use supplies as instructed; Kit contains: (15) Saline Bullets; (15) 3x3 Gauze; 15 pr Gloves Cleanser: Soap and Water 3 x Per Week/30 Days Discharge Instructions: Gently cleanse wound with antibacterial soap, rinse and pat dry prior to dressing wounds Topical: Activon Honey Gel, 25 (g) Tube 3 x Per Week/30 Days Prim Dressing: Xeroform 4x4-HBD (in/in) (Generic) 3 x Per Week/30 Days ary Discharge Instructions: Apply Xeroform 4x4-HBD (in/in) as directed Secondary Dressing: ABD Pad 5x9 (in/in) 3 x Per Week/30 Days Discharge Instructions: Cover with ABD pad Secured With: Medipore T - 77M Medipore H Soft Cloth Surgical T ape ape, 2x2 (in/yd) (Generic) 3 x Per Week/30 Days Secured With: [employer] Sterile or Non-Sterile 6-ply 4.5x4 (yd/yd) (Generic) 3 x Per Week/30 Days Discharge Instructions: Apply Kerlix as directed WOUND #3: - Lower Leg Wound Laterality: Left, Medial Cleanser: [employer] Ancillary Kit - 15 Day Supply (Generic) 3 x Per Week/30 Days Discharge Instructions: Use supplies as instructed; Kit contains: (15) Saline Bullets; (15) 3x3 Gauze; 15 pr Gloves Cleanser: Soap and Water 3 x Per Week/30 Days Discharge Instructions: Gently cleanse wound with antibacterial soap, rinse and pat dry prior to dressing wounds Topical: Activon Honey Gel, 25 (g) Tube 3 x Per Week/30 Days Prim Dressing: Xeroform 4x4-HBD (in/in) (Generic) 3 x Per Week/30 Days ary Discharge Instructions: Apply Xeroform 4x4-HBD (in/in) as directed Secondary Dressing: ABD Pad 5x9 (in/in) 3 x Per Week/30 Days Discharge Instructions: Cover with ABD pad Secured With: Medipore T - 77M Medipore H Soft Cloth Surgical T ape ape, 2x2 (in/yd) (Generic) 3 x Per Week/30 Days Secured With: [employer] Sterile or Non-Sterile 6-ply 4.5x4 (yd/yd) (Generic) 3 x Per Week/30 Days Discharge Instructions: Apply Kerlix as directed WOUND #4: - Lower Leg Wound Laterality: Left, Lateral Cleanser:  [employer] Ancillary Kit - 15 Day Supply (Generic) 3 x Per Week/30 Days Discharge Instructions: Use supplies as instructed; Kit contains: (15) Saline Bullets; (15) 3x3 Gauze; 15 pr Gloves Cleanser: Soap and Water 3 x Per Week/30 Days Discharge Instructions: Gently cleanse wound with antibacterial soap, rinse and pat dry prior to dressing wounds Topical: Activon Honey Gel, 25 (g) Tube 3 x Per Week/30 Days Prim Dressing: Xeroform 4x4-HBD (in/in) (Generic) 3 x Per Week/30 Days ary Discharge Instructions: Apply Xeroform 4x4-HBD (in/in) as directed Secondary Dressing: ABD Pad 5x9 (in/in) 3 x Per Week/30 Days Discharge Instructions: Cover with ABD pad Secured With: Medipore T - 77M Medipore H Soft Cloth  Surgical T ape ape, 2x2 (in/yd) (Generic) 3 x Per Week/30 Days Secured With: [employer] Sterile or Non-Sterile 6-ply 4.5x4 (yd/yd) (Generic) 3 x Per Week/30 Days Discharge Instructions: Apply Kerlix as directed WOUND #5: - Forearm Wound Laterality: Right, Circumferential Cleanser: [employer]ram Ancillary Kit - 15 Day Supply (Generic) 3 x Per Week/30 Days Discharge Instructions: Use supplies as instructed; Kit contains: (15) Saline Bullets; (15) 3x3 Gauze; 15 pr Gloves Cleanser: Soap and Water 3 x Per Week/30 Days Discharge Instructions: Gently cleanse wound with antibacterial soap, rinse and pat dry prior to dressing wounds Topical: Activon Honey Gel, 25 (g) Tube 3 x Per Week/30 Days Prim Dressing: Xeroform 4x4-HBD (in/in) (Generic) 3 x Per Week/30 Days ary Discharge Instructions: Apply Xeroform 4x4-HBD (in/in) as directed Secondary Dressing: ABD Pad 5x9 (in/in) 3 x Per Week/30 Days Discharge Instructions: Cover with ABD pad Secured With: Medipore T - 56M Medipore H Soft Cloth Surgical T ape ape, 2x2 (in/yd) (Generic) 3 x Per Week/30 Days Secured With: [employer] Sterile or Non-Sterile 6-ply 4.5x4 (yd/yd) (Generic) 3 x Per Week/30 Days Discharge Instructions: Apply Kerlix as directed WOUND #6: - Forearm  Wound Laterality: Left, Circumferential Cleanser: [employer]ram Ancillary Kit - 15 Day Supply (Generic) 3 x Per Week/30 Days Discharge Instructions: Use supplies as instructed; Kit contains: (15) Saline Bullets; (15) 3x3 Gauze; 15 pr Gloves Cleanser: Soap and Water 3 x Per Week/30 Days Discharge Instructions: Gently cleanse wound with antibacterial soap, rinse and pat dry prior to dressing wounds Topical: Activon Honey Gel, 25 (g) Tube 3 x Per Week/30 Days Prim Dressing: Xeroform 4x4-HBD (in/in) (Generic) 3 x Per Week/30 Days CAMERYN, CHRISLEY ([id number]) [id number] Page 11 of 12 Discharge Instructions: Apply Xeroform 4x4-HBD (in/in) as directed Secondary Dressing: ABD Pad 5x9 (in/in) 3 x Per Week/30 Days Discharge Instructions: Cover with ABD pad Secured With: Medipore T - 56M Medipore H Soft Cloth Surgical T ape ape, 2x2 (in/yd) (Generic) 3 x Per Week/30 Days Secured With: [employer] Sterile or Non-Sterile 6-ply 4.5x4 (yd/yd) (Generic) 3 x Per Week/30 Days Discharge Instructions: Apply Kerlix as directed 1. I would recommend that we have the patient going continue to monitor for any signs of worsening or infection. Right now I do believe that continue with the Medihoney is probably the best way to go over covering this with Xeroform. 2. Also can recommend that we have the patient continue with the ABD pads to cover followed by roll gauze to secure in place. 3. I would also suggest the patient needs to keep this covered all the time. In the past he was removing it at times and I do believe this by an open area led to him possibly having a magnet area on the right leg that he actually cleaned away but nonetheless that is definitely not something that he wants to keep having happened. For that reason I do think that what he needs to do is keep this covered and I stressed that with him today as well. We will see patient back for reevaluation in 1 week here in the clinic. If  anything worsens or changes patient will contact our office for additional recommendations. Electronic Signature(s) Signed: 08/24/2022 7:57:23 AM By: Kirk Keeler PA-C Entered By: [name] on 08/24/2022 10:57:23 -------------------------------------------------------------------------------- SuperBill Details Patient Name: Date of Service: BRADRICK, KAMAU 08/24/2022 Medical Record Number: [id number] Patient Account Number: [id number] Date of Birth/Sex: Treating RN: 1981-08-15 (41 y.o. 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, unspecified L97.812 Non-pressure chronic ulcer of other part of right lower leg with fat layer exposed L97.822 Non-pressure chronic ulcer of other part of left lower leg with fat layer exposed L98.492 Non-pressure chronic ulcer of skin of other sites with fat layer exposed L03.113 Cellulitis of right upper limb L03.114 Cellulitis of left upper limb L03.115 Cellulitis of right lower limb L03.116 Cellulitis of left lower limb Facility Procedures : CPT4 Code: [id number] Description: 54656 - DEB SUBQ TISSUE 20 SQ CM/< ICD-10 Diagnosis Description C12.751 Non-pressure chronic ulcer of other part of right lower leg with fat layer expo L98.492 Non-pressure chronic ulcer of skin of other sites with fat layer exposed Modifier: sed Quantity: 1 : CPT4 Code: [id number] Description: 49675 - DEB SUBQ TISS EA ADDL 20CM ICD-10 Diagnosis Description F16.384 Non-pressure chronic ulcer of other part of right lower leg with fat layer expo L98.492 Non-pressure chronic ulcer of skin of other sites with fat layer exposed Modifier: sed Quantity: 1 Physician Procedures NORLAN, RANN ([id number]):  CPT4 Code Description [id number] 11042 - WC PHYS SUBQ TISS 20 SQ CM ICD-10 Diagnosis Description Z00.923 Non-pressure chronic ulcer of other part of right lower leg with L98.492 Non-pressure chronic ulcer of skin of other sites  with fat layer [id number] Page 12 of 12: Quantity Modifier 1 fat layer exposed exposed Fehr, Daijon ([id number]): [id number] 11045 - WC PHYS SUBQ TISS EA ADDL 20 CM ICD-10 Diagnosis Description B63.893 Non-pressure chronic ulcer of other part of right lower leg with L98.492 Non-pressure chronic ulcer of skin of other sites with fat layer [id number] Page 12 of 12: 1 fat layer exposed exposed Electronic Signature(s) Signed: 08/24/2022 7:57:59 AM By: Kirk Keeler PA-C Entered By: [name] on 08/24/2022 10:57:59

## 2022-08-31 ENCOUNTER — Encounter: Payer: Medicare PPO | Admitting: Physician Assistant

## 2022-08-31 DIAGNOSIS — L03113 Cellulitis of right upper limb: Secondary | ICD-10-CM | POA: Diagnosis not present

## 2022-08-31 DIAGNOSIS — F19959 Other psychoactive substance use, unspecified with psychoactive substance-induced psychotic disorder, unspecified: Secondary | ICD-10-CM | POA: Diagnosis not present

## 2022-08-31 DIAGNOSIS — L03114 Cellulitis of left upper limb: Secondary | ICD-10-CM | POA: Diagnosis not present

## 2022-08-31 DIAGNOSIS — L03115 Cellulitis of right lower limb: Secondary | ICD-10-CM | POA: Diagnosis not present

## 2022-08-31 DIAGNOSIS — L97812 Non-pressure chronic ulcer of other part of right lower leg with fat layer exposed: Secondary | ICD-10-CM | POA: Diagnosis not present

## 2022-08-31 DIAGNOSIS — L97822 Non-pressure chronic ulcer of other part of left lower leg with fat layer exposed: Secondary | ICD-10-CM | POA: Diagnosis not present

## 2022-08-31 DIAGNOSIS — L03116 Cellulitis of left lower limb: Secondary | ICD-10-CM | POA: Diagnosis not present

## 2022-08-31 DIAGNOSIS — L98492 Non-pressure chronic ulcer of skin of other sites with fat layer exposed: Secondary | ICD-10-CM | POA: Diagnosis not present

## 2022-08-31 NOTE — Progress Notes (Signed)
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 directed Compression Wrap Compression Stockings Add-Ons Electronic Signature(s) Signed: 08/31/2022 4:52:53 PM By: Carlene Coria RN Entered By: Carlene Coria on 08/31/2022 13:44:58 -------------------------------------------------------------------------------- Wound Assessment Details Patient Name: Date of Service: UMBERTO, PAVEK 08/31/2022 1:15 PM Medical Record Number: [id number] Patient Account Number: [id number] Date of Birth/Sex: Treating RN: 09-26-1981 (41 y.o. [name] Primary Care Salem Lembke: Crissie Figures Other Clinician: Referring Larine Fielding: Treating Ceanna Wareing/Extender: Guy Begin Weeks in Treatment: 2 Wound Status Wound Number:  5 Primary Etiology: Atypical Wound Location: Right, Circumferential Forearm Wound Status: Open Wounding Event: Other Lesion Date Acquired: 11/09/2021 Weeks Of Treatment: 2 Clustered Wound: Yes Photos Calixto, [name] ([id number]) [id number] Page 14 of 16 Wound Measurements Length: (cm) 11 Width: (cm) 32 Depth: (cm) 0.2 Area: (cm) 276.46 Volume: (cm) 55.292 % Reduction in Area: 47.1% % Reduction in Volume: 47.1% Tunneling: No Undermining: No Wound Description Classification: Full Thickness Without Exposed Suppor Exudate Amount: Medium Exudate Type: Serosanguineous Exudate Color: red, brown t Structures Foul Odor After Cleansing: No Slough/Fibrino Yes Wound Bed Granulation Amount: Medium (34-66%) Exposed Structure Granulation Quality: Red, Pink Fascia Exposed: No Necrotic Amount: Medium (34-66%) Fat Layer (Subcutaneous Tissue) Exposed: Yes Necrotic Quality: Adherent Slough Tendon Exposed: No Muscle Exposed: No Joint Exposed: No Bone Exposed: No Treatment Notes Wound #5 (Forearm) Wound Laterality: Right, Circumferential Cleanser Byram Ancillary Kit - 15 Day Supply Discharge Instruction: Use supplies as instructed; Kit contains: (15) Saline Bullets; (15) 3x3 Gauze; 15 pr Gloves Soap and Water Discharge Instruction: Gently cleanse wound with antibacterial soap, rinse and pat dry prior to dressing wounds Peri-Wound Care Topical Activon Honey Gel, 25 (g) Tube Primary Dressing Xeroform 4x4-HBD (in/in) Discharge Instruction: Apply Xeroform 4x4-HBD (in/in) as directed Secondary Dressing ABD Pad 5x9 (in/in) Discharge Instruction: Cover with ABD pad Secured With Medipore T - 16M Medipore H Soft Cloth Surgical T ape ape, 2x2 (in/yd) Kerlix Roll Sterile or Non-Sterile 6-ply 4.5x4 (yd/yd) Discharge Instruction: Apply Kerlix as directed Compression Wrap Compression Stockings Add-Ons Electronic Signature(s) Signed: 08/31/2022 4:52:53 PM By: Carlene Coria RN Entered By: Carlene Coria on 08/31/2022 13:45:51 Hammen, Nakeem (  [id number]) [id number] Page 15 of 16 -------------------------------------------------------------------------------- Wound Assessment Details Patient Name: Date of Service: CHEROKEE, CLOWERS 08/31/2022 1:15 PM Medical Record Number: [id number] Patient Account Number: [id number] Date of Birth/Sex: Treating RN: Feb 25, 1981 (41 y.o. [name]) Carlene Coria Primary Care Maxamillian Tienda: Crissie Figures Other Clinician: Referring Jahziel Sinn: Treating Fynlee Rowlands/Extender: Guy Begin Weeks in Treatment: 2 Wound Status Wound Number: 6 Primary Etiology: Atypical Wound Location: Left, Circumferential Forearm Wound Status: Open Wounding Event: Other Lesion Date Acquired: 11/09/2021 Weeks Of Treatment: 2 Clustered Wound: Yes Photos Wound Measurements Length: (cm) 10 Width: (cm) 32 Depth: (cm) 0.2 Area: (cm) 251.327 Volume: (cm) 50.265 % Reduction in Area: 44.6% % Reduction in Volume: 44.6% Wound Description Classification: Full Thickness Without Exposed Support Exudate Amount: Medium Exudate Type: Serosanguineous Exudate Color: red, brown Structures Treatment Notes Wound #6 (Forearm) Wound Laterality: Left, Circumferential Cleanser Byram Ancillary Kit - 15 Day Supply Discharge Instruction: Use supplies as instructed; Kit contains: (15) Saline Bullets; (15) 3x3 Gauze; 15 pr Gloves Soap and Water Discharge Instruction: Gently cleanse wound with antibacterial soap, rinse and pat dry prior to dressing wounds Peri-Wound Care Topical Activon Honey Gel, 25 (g) Tube Primary Dressing Xeroform 4x4-HBD (in/in) Discharge Instruction: Apply Xeroform 4x4-HBD (in/in) as directed ANTIONIO, NEGRON ([id number]) [id number] Page 16 of 16 Secondary Dressing ABD Pad 5x9 (in/in) Discharge Instruction: Cover with ABD pad Secured With [place] H Soft Cloth Surgical  T ape ape, 2x2 (in/yd) Kerlix Roll Sterile or Non-Sterile 6-ply 4.5x4 (yd/yd) Discharge Instruction: Apply Kerlix as directed Compression Wrap Compression Stockings Add-Ons Electronic Signature(s) Signed: 08/31/2022 4:52:53 PM By: Carlene Coria RN Entered By: Carlene Coria on 08/31/2022 13:46:44 -------------------------------------------------------------------------------- Vitals Details Patient Name: Date of Service: DOMONICK, SITTNER 08/31/2022 1:15 PM Medical Record Number: [id number] Patient Account Number: [id number] Date of Birth/Sex: Treating RN: 1981/07/19 (41 y.o. [name] Primary Care Kynli Chou: Crissie Figures Other Clinician: Referring Dmauri Rosenow: Treating Calandra Madura/Extender: Guy Begin Weeks in Treatment: 2 Vital Signs Time Taken: 13:25 Temperature (F): 98.3 Height (in): 73 Pulse (bpm): 91 Weight (lbs): 230 Respiratory Rate (breaths/min): 18 Body Mass Index (BMI): 30.3 Blood Pressure (mmHg): 148/103 Reference Range: 80 - 120 mg / dl Electronic Signature(s) Signed: 08/31/2022 4:52:53 PM By: Carlene Coria RN Entered By: Carlene Coria on 08/31/2022 13:25:26

## 2022-08-31 NOTE — Progress Notes (Signed)
[name], Kirk ([id number]) [id number] Page 1 of 11 Visit Report for 08/31/2022 Chief Complaint Document Details Patient Name: Date of Service: [name], [name] 08/31/2022 1:15 PM Medical Record Number: [id number] Patient Account Number: [id number] Date of Birth/Sex: Treating RN: 12-18-1980 (41 y.o. 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 [name] Primary Care Provider: Crissie Figures Other Clinician: Referring Provider: Treating Provider/Extender: Guy Begin Weeks in Treatment: 2 Debridement Performed for Assessment: Wound #1 Right,Lateral Lower Leg Performed By: Physician Tommie Sams., PA-C Debridement Type: Debridement Level of Consciousness (Pre-procedure): Awake and Alert Pre-procedure Verification/Time Out Yes - 14:27 Taken: Start Time: 14:27 T Area Debrided (L x W): otal 2 (cm) x 3 (cm) = 6 (cm) Tissue and other material debrided: Eschar, Slough, Subcutaneous, Skin: Dermis , Skin: Epidermis, Slough Level: Skin/Subcutaneous Tissue Debridement Description: Excisional Instrument: Curette Bleeding: Minimum Hemostasis Achieved: Pressure End Time: 14:38 Procedural Pain: 0 Post Procedural Pain: 0 Response to Treatment: Procedure was tolerated well Level of  Consciousness KENDELL, SAGRAVES ([id number]) [id number] Page 2 of 11 Level of Consciousness (Post- Awake and Alert procedure): Post Debridement Measurements of Total Wound Length: (cm) 7 Width: (cm) 3 Depth: (cm) 0.2 Volume: (cm) 3.299 Character of Wound/Ulcer Post Debridement: Improved Post Procedure Diagnosis Same as Pre-procedure Electronic Signature(s) Unsigned Entered ByCarlene Coria on 08/31/2022 14:33:03 -------------------------------------------------------------------------------- Debridement Details Patient Name: Date of Service: [name], TRITSCHLER 08/31/2022 1:15 PM Medical Record Number: [id number] Patient Account Number: [id number] Date of Birth/Sex: Treating RN: 22-May-1981 (41 y.o. [name] Primary Care Provider: Crissie Figures Other Clinician: Referring Provider: Treating Provider/Extender: Guy Begin Weeks in Treatment: 2 Debridement Performed for Assessment: Wound #5 Right,Circumferential Forearm Performed By: Physician Tommie Sams., PA-C Debridement Type: Debridement Level of Consciousness (Pre-procedure): Awake and Alert Pre-procedure Verification/Time Out Yes - 14:27 Taken: Start Time: 14:27 T Area Debrided (L x W): otal 2 (cm) x 2 (cm) = 4 (cm) Tissue and other material debrided: Eschar, Slough, Subcutaneous, Skin: Dermis , Skin: Epidermis, Slough Level: Skin/Subcutaneous Tissue Debridement Description: Excisional Instrument: Curette Bleeding: Minimum Hemostasis Achieved: Pressure End Time: 14:38 Procedural Pain: 0 Post Procedural Pain: 0 Response to Treatment: Procedure was tolerated well Level of Consciousness (Post- Awake and Alert procedure): Post Debridement Measurements of Total Wound Length: (cm) 11 Width: (cm) 32 Depth: (cm) 0.2 Volume: (cm) 55.292 Character of Wound/Ulcer Post Debridement: Improved Post Procedure Diagnosis Same as Pre-procedure Electronic  Signature(s) Alexy Heldt, [name] ([id number]) [id number] Page 3 of 11 Unsigned Entered By: Carlene Coria on 08/31/2022 14:33:35 -------------------------------------------------------------------------------- HPI Details Patient Name: Date of Service: [name], Kirk 08/31/2022 1:15 PM Medical Record Number: [id number] Patient Account Number: [id number] Date of Birth/Sex: Treating RN: [birth year] (41 y.o. [name] Primary Care Provider: Crissie Figures Other Clinician: Referring Provider: Treating Provider/Extender: Guy Begin Weeks in Treatment: 2 History of Present Illness HPI Description: 08-17-2022 upon evaluation today patient has multiple wounds over the bilateral arms and lower extremities. 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 He tells me that he  has had the wounds on the legs for quite some time and he has not used in that area in the past 6 months. Nonetheless he also has not been able to get him to heal based on what he tells me. He does tell me though that he continues to use and inject the fentanyl although last time was a week ago. It has been discussed with him when he was in the hospital going into rehabilitation. With that being said he tells me the last time he did this that it really was not good and really was not helpful. Nonetheless I do believe this is still something that needs to be strongly considered and I had a really good conversation with him today in this regard. I discussed that further and the plan. In regard to the wounds themselves the patient unfortunately is having a lot of issues with necrotic eschar which is really tightly adhered in a lot of places. I do believe that he has sufficient blood flow to heal though I think this is just where the tissue honestly which just destroyed the paren from infiltrating fentanyl when he was injecting. This is good to be more difficult to  heal due to the deeper damage and I discussed that with him today as well he is also had cellulitis although that looks to be resolving to me at this point. The good news is he really does not have any other major medical problems other than substance abuse. 08-24-2022 upon evaluation today patient appears to be doing well currently in regard to his wounds in fact he is actually showing signs of significant improvement a lot of the eschar as he is actually pulled off which I think have made a big difference. With that being said I think that we do need to try to debride away some of the areas today and he is in agreement with that plan. 08-31-2022 upon evaluation today patient appears to be doing well currently in regard to his wounds he is actually making some good progress here overall. Fortunately I do not see any signs of active infection. I do think he is going require some debridement a couple of spots although a lot of the wounds were very close to complete closure which is great news. Electronic Signature(s) Signed: 08/31/2022 2:42:15 PM By: Worthy Keeler PA-C Entered By: Worthy Keeler on 08/31/2022 14:42:15 -------------------------------------------------------------------------------- Physical Exam Details Patient Name: Date of Service: [name], [name] 08/31/2022 1:15 PM Medical Record Number: [id number] Patient Account Number: [id number] Date of Birth/Sex: Treating RN: 1981-04-28 (41 y.o. [name] Primary Care Provider: Crissie Figures Other Clinician: Referring Provider: Treating Provider/Extender: Guy Begin Weeks in Treatment: 2 Arens, [name] ([id number]) [id number] Page 4 of 11 Constitutional Well-nourished and well-hydrated in no acute distress. Respiratory normal breathing without difficulty. Psychiatric this patient is able to make decisions and demonstrates good insight into disease process. Alert and Oriented x 3.  pleasant and cooperative. Notes Upon inspection patient's wound bed actually showed signs of good granulation and epithelization at this point. Fortunately I do not see any signs of infection which is great news and overall I am extremely pleased with where we stand currently. No fevers, chills, nausea, vomiting, or diarrhea. Electronic Signature(s) Signed: 08/31/2022 2:42:37 PM By: Worthy Keeler PA-C Entered By: Worthy Keeler on 08/31/2022 14:42:37 -------------------------------------------------------------------------------- Physician Orders Details Patient Name: Date of Service: OSAMA, COLESON 08/31/2022 1:15 PM Medical Record Number: [id number] Patient Account Number: [id number] Date of Birth/Sex:  Treating RN: 31-Mar-1981 (41 y.o. [name] Primary Care Provider: Crissie Figures Other Clinician: Referring Provider: Treating Provider/Extender: Guy Begin Weeks in Treatment: 2 Verbal / Phone Orders: No Diagnosis Coding Follow-up Appointments Return Appointment in 1 week. Bathing/ Shower/ Hygiene May shower; gently cleanse wound with antibacterial soap, rinse and pat dry prior to dressing wounds Edema Control - Lymphedema / Segmental Compressive Device / Other Elevate, Exercise Daily and A void Standing for Long Periods of Time. Elevate legs to the level of the heart and pump ankles as often as possible Elevate leg(s) parallel to the floor when sitting. Wound Treatment Wound #1 - Lower Leg Wound Laterality: Right, Lateral Cleanser: [employer] Ancillary Kit - 15 Day Supply (Generic) 3 x Per Week/30 Days Discharge Instructions: Use supplies as instructed; Kit contains: (15) Saline Bullets; (15) 3x3 Gauze; 15 pr Gloves Cleanser: Soap and Water 3 x Per Week/30 Days Discharge Instructions: Gently cleanse wound with antibacterial soap, rinse and pat dry prior to dressing wounds Topical: Activon Honey Gel, 25 (g) Tube 3 x Per Week/30 Days Prim Dressing: Xeroform  4x4-HBD (in/in) (Generic) 3 x Per Week/30 Days ary Discharge Instructions: Apply Xeroform 4x4-HBD (in/in) as directed Secured With: Medipore T - 54M Medipore H Soft Cloth Surgical T ape ape, 2x2 (in/yd) (Generic) 3 x Per Week/30 Days Secured With: [employer] Sterile or Non-Sterile 6-ply 4.5x4 (yd/yd) (Generic) 3 x Per Week/30 Days Discharge Instructions: Apply Kerlix as directed Secured With: Stretch Net Dressing, Latex-free, Size 5, Small-Head / Shoulder / Thigh 3 x Per Week/30 Days Wound #2 - Lower Leg Wound Laterality: Right, Medial Cleanser: Byram Ancillary Kit - 15 Day Supply (Generic) 3 x Per Week/30 Days Betancur, [name] ([id number]) [id number] Page 5 of 11 Discharge Instructions: Use supplies as instructed; Kit contains: (15) Saline Bullets; (15) 3x3 Gauze; 15 pr Gloves Cleanser: Soap and Water 3 x Per Week/30 Days Discharge Instructions: Gently cleanse wound with antibacterial soap, rinse and pat dry prior to dressing wounds Topical: Activon Honey Gel, 25 (g) Tube 3 x Per Week/30 Days Prim Dressing: Xeroform 4x4-HBD (in/in) (Generic) 3 x Per Week/30 Days ary Discharge Instructions: Apply Xeroform 4x4-HBD (in/in) as directed Secured With: Medipore T - 54M Medipore H Soft Cloth Surgical T ape ape, 2x2 (in/yd) (Generic) 3 x Per Week/30 Days Secured With: [employer] Sterile or Non-Sterile 6-ply 4.5x4 (yd/yd) (Generic) 3 x Per Week/30 Days Discharge Instructions: Apply Kerlix as directed Secured With: [employer] Dressing, Latex-free, Size 5, Small-Head / Shoulder / Thigh 3 x Per Week/30 Days Wound #3 - Lower Leg Wound Laterality: Left, Medial Cleanser: Byram Ancillary Kit - 15 Day Supply (Generic) 3 x Per Week/30 Days Discharge Instructions: Use supplies as instructed; Kit contains: (15) Saline Bullets; (15) 3x3 Gauze; 15 pr Gloves Cleanser: Soap and Water 3 x Per Week/30 Days Discharge Instructions: Gently cleanse wound with antibacterial soap, rinse and pat  dry prior to dressing wounds Topical: Activon Honey Gel, 25 (g) Tube 3 x Per Week/30 Days Prim Dressing: Xeroform 4x4-HBD (in/in) (Generic) 3 x Per Week/30 Days ary Discharge Instructions: Apply Xeroform 4x4-HBD (in/in) as directed Secured With: Medipore T - 54M Medipore H Soft Cloth Surgical T ape ape, 2x2 (in/yd) (Generic) 3 x Per Week/30 Days Secured With: [employer] Sterile or Non-Sterile 6-ply 4.5x4 (yd/yd) (Generic) 3 x Per Week/30 Days Discharge Instructions: Apply Kerlix as directed Secured With: [employer] Dressing, Latex-free, Size 5, Small-Head / Shoulder / Thigh 3 x Per Week/30 Days Wound #4 - Lower Leg Wound  Laterality: Left, Lateral Cleanser: Byram Ancillary Kit - 15 Day Supply (Generic) 3 x Per Week/30 Days Discharge Instructions: Use supplies as instructed; Kit contains: (15) Saline Bullets; (15) 3x3 Gauze; 15 pr Gloves Cleanser: Soap and Water 3 x Per Week/30 Days Discharge Instructions: Gently cleanse wound with antibacterial soap, rinse and pat dry prior to dressing wounds Topical: Activon Honey Gel, 25 (g) Tube 3 x Per Week/30 Days Prim Dressing: Xeroform 4x4-HBD (in/in) (Generic) 3 x Per Week/30 Days ary Discharge Instructions: Apply Xeroform 4x4-HBD (in/in) as directed Secured With: Medipore T - 15M Medipore H Soft Cloth Surgical T ape ape, 2x2 (in/yd) (Generic) 3 x Per Week/30 Days Secured With: [employer] Sterile or Non-Sterile 6-ply 4.5x4 (yd/yd) (Generic) 3 x Per Week/30 Days Discharge Instructions: Apply Kerlix as directed Secured With: [employer] Dressing, Latex-free, Size 5, Small-Head / Shoulder / Thigh 3 x Per Week/30 Days Wound #5 - Forearm Wound Laterality: Right, Circumferential Cleanser: Byram Ancillary Kit - 15 Day Supply (Generic) 3 x Per Week/30 Days Discharge Instructions: Use supplies as instructed; Kit contains: (15) Saline Bullets; (15) 3x3 Gauze; 15 pr Gloves Cleanser: Soap and Water 3 x Per Week/30 Days Discharge Instructions: Gently cleanse  wound with antibacterial soap, rinse and pat dry prior to dressing wounds Topical: Activon Honey Gel, 25 (g) Tube 3 x Per Week/30 Days Prim Dressing: Xeroform 4x4-HBD (in/in) (Generic) 3 x Per Week/30 Days ary Discharge Instructions: Apply Xeroform 4x4-HBD (in/in) as directed Secured With: Medipore T - 15M Medipore H Soft Cloth Surgical T ape ape, 2x2 (in/yd) (Generic) 3 x Per Week/30 Days Secured With: [employer] Sterile or Non-Sterile 6-ply 4.5x4 (yd/yd) (Generic) 3 x Per Week/30 Days Discharge Instructions: Apply Kerlix as directed Secured With: Stretch Net Dressing, Latex-free, Size 5, Small-Head / Shoulder / Thigh 3 x Per Week/30 Days Wound #6 - Forearm Wound Laterality: Left, Circumferential Decoursey, Lavance ([id number]) [id number] Page 6 of 11 Cleanser: [employer]ram Ancillary Kit - 15 Day Supply (Generic) 3 x Per Week/30 Days Discharge Instructions: Use supplies as instructed; Kit contains: (15) Saline Bullets; (15) 3x3 Gauze; 15 pr Gloves Cleanser: Soap and Water 3 x Per Week/30 Days Discharge Instructions: Gently cleanse wound with antibacterial soap, rinse and pat dry prior to dressing wounds Topical: Activon Honey Gel, 25 (g) Tube 3 x Per Week/30 Days Prim Dressing: Xeroform 4x4-HBD (in/in) (Generic) 3 x Per Week/30 Days ary Discharge Instructions: Apply Xeroform 4x4-HBD (in/in) as directed Secured With: Medipore T - 15M Medipore H Soft Cloth Surgical T ape ape, 2x2 (in/yd) (Generic) 3 x Per Week/30 Days Secured With: Kerlix Roll Sterile or Non-Sterile 6-ply 4.5x4 (yd/yd) (Generic) 3 x Per Week/30 Days Discharge Instructions: Apply Kerlix as directed Secured With: [employer] Dressing, Latex-free, Size 5, Small-Head / Shoulder / Thigh 3 x Per Week/30 Days Electronic Signature(s) Unsigned Entered ByCarlene Coria on 08/31/2022 15:42:19 -------------------------------------------------------------------------------- Problem List Details Patient Name: Date of  Service: KEMAL, AMORES 08/31/2022 1:15 PM Medical Record Number: [id number] Patient Account Number: [id number] Date of Birth/Sex: Treating RN: [birth year] (41 y.o. 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/07/2022  No Yes L98.492 Non-pressure chronic ulcer of skin of other sites with fat layer exposed 08/17/2022 No Yes L03.113 Cellulitis of right upper limb 08/17/2022 No Yes Tippin, [name] ([id number]) [id number] Page 7 of 11 L03.114 Cellulitis of left upper limb 08/17/2022 No Yes L03.115 Cellulitis of right lower limb 08/17/2022 No Yes L03.116 Cellulitis of left lower limb 08/17/2022 No Yes Inactive Problems Resolved Problems Electronic Signature(s) Signed: 08/31/2022 2:24:45 PM By: Worthy Keeler PA-C Entered By: Worthy Keeler on 08/31/2022 14:24:45 -------------------------------------------------------------------------------- Progress Note Details Patient Name: Date of Service: DAVI, KROON 08/31/2022 1:15 PM Medical Record Number: [id number] Patient Account Number: [id number] Date of Birth/Sex: Treating RN: 08/26/1981 (41 y.o. [name] Primary Care Provider: Crissie Figures Other Clinician: Referring Provider: Treating Provider/Extender: Guy Begin Weeks in Treatment: 2 Subjective Chief Complaint Information obtained from Patient Bilateral Upper and Lower Extremity Ulcers Due to IV Drug  Use (Fentanyl) History of Present Illness (HPI) 08-17-2022 upon evaluation today patient has multiple wounds over the bilateral arms and lower extremities. 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 He tells me that he has had the wounds on the legs for quite some time and he has not used in that area in the past 6 months. Nonetheless he also has not been able to get him to heal based on what he tells me. He does tell me though that he continues to use and inject the fentanyl although last time was a week ago. It has been discussed with him when he was in the hospital going into rehabilitation. With that being said he tells me the last time he did this that it really was not good and really was not helpful. Nonetheless I do believe this is still something that needs to be strongly considered and I had a really good conversation with him today in this regard. I discussed that further and the plan. In regard to the wounds themselves the patient unfortunately is having a lot of issues with necrotic eschar which is really tightly adhered in a lot of places. I do believe that he has sufficient blood flow to heal though I think this is just where the tissue honestly which just destroyed the paren from infiltrating fentanyl when he was injecting. This is good to be more difficult to heal due to the deeper damage and I discussed that with him today as well he is also had cellulitis although that looks to be resolving to me at this point. The good news is he really does not have any other major medical problems other than substance abuse. 08-24-2022 upon evaluation today patient appears to be doing well currently in regard to his wounds in fact he is actually showing signs of significant improvement a lot of the eschar as he is actually pulled off which I think have made a big difference. With that being said I think that we do need to try to debride away some of the areas today and he  is in agreement with that plan. 08-31-2022 upon evaluation today patient appears to be doing well currently in regard to his wounds he is actually making some good progress here overall. Fortunately I do not see any signs of active infection. I do think he is going require some debridement a couple of spots although a lot of the wounds were very close to complete closure which is great news. MUHSIN, DORIS ([id number]) [id number] Page 8 of 11 Objective Constitutional Well-nourished and well-hydrated in  no acute distress. Vitals Time Taken: 1:25 PM, Height: 73 in, Weight: 230 lbs, BMI: 30.3, Temperature: 98.3 F, Pulse: 91 bpm, Respiratory Rate: 18 breaths/min, Blood Pressure: 148/103 mmHg. Respiratory normal breathing without difficulty. Psychiatric this patient is able to make decisions and demonstrates good insight into disease process. Alert and Oriented x 3. pleasant and cooperative. General Notes: Upon inspection patient's wound bed actually showed signs of good granulation and epithelization at this point. Fortunately I do not see any signs of infection which is great news and overall I am extremely pleased with where we stand currently. No fevers, chills, nausea, vomiting, or diarrhea. Integumentary (Hair, Skin) Wound #1 status is Open. Original cause of wound was Other Lesion. The date acquired was: 11/09/2021. The wound has been in treatment 2 weeks. The wound is located on the Right,Lateral Lower Leg. The wound measures 7cm length x 3cm width x 0.2cm depth; 16.493cm^2 area and 3.299cm^3 volume. There is Fat Layer (Subcutaneous Tissue) exposed. There is no tunneling or undermining noted. There is a medium amount of serosanguineous drainage noted. There is medium (34-66%) red granulation within the wound bed. There is a medium (34-66%) amount of necrotic tissue within the wound bed including Adherent Slough. Wound #2 status is Open. Original cause of wound was  Other Lesion. The date acquired was: 11/09/2021. The wound has been in treatment 2 weeks. The wound is located on the Right,Medial Lower Leg. The wound measures 7cm length x 1.5cm width x 0.2cm depth; 8.247cm^2 area and 1.649cm^3 volume. There is Fat Layer (Subcutaneous Tissue) exposed. There is no tunneling or undermining noted. There is a medium amount of serosanguineous drainage noted. There is medium (34-66%) red granulation within the wound bed. There is a medium (34-66%) amount of necrotic tissue within the wound bed including Adherent Slough. Wound #3 status is Open. Original cause of wound was Other Lesion. The date acquired was: 11/09/2021. The wound has been in treatment 2 weeks. The wound is located on the Left,Medial Lower Leg. The wound measures 5cm length x 6cm width x 0.2cm depth; 23.562cm^2 area and 4.712cm^3 volume. There is no tunneling or undermining noted. There is a medium amount of serosanguineous drainage noted. There is medium (34-66%) red granulation within the wound bed. There is a medium (34-66%) amount of necrotic tissue within the wound bed including Adherent Slough. Wound #4 status is Open. Original cause of wound was Other Lesion. The date acquired was: 11/09/2021. The wound has been in treatment 2 weeks. The wound is located on the Left,Lateral Lower Leg. The wound measures 7cm length x 5cm width x 0.2cm depth; 27.489cm^2 area and 5.498cm^3 volume. There is Fat Layer (Subcutaneous Tissue) exposed. There is no tunneling or undermining noted. There is a medium amount of serosanguineous drainage noted. There is medium (34-66%) red granulation within the wound bed. There is a medium (34-66%) amount of necrotic tissue within the wound bed including Adherent Slough. Wound #5 status is Open. Original cause of wound was Other Lesion. The date acquired was: 11/09/2021. The wound has been in treatment 2 weeks. The wound is located on the Right,Circumferential Forearm. The wound measures  11cm length x 32cm width x 0.2cm depth; 276.46cm^2 area and 55.292cm^3 volume. There is Fat Layer (Subcutaneous Tissue) exposed. There is no tunneling or undermining noted. There is a medium amount of serosanguineous drainage noted. There is medium (34-66%) red, pink granulation within the wound bed. There is a medium (34-66%) amount of necrotic tissue within the wound bed including Adherent Slough.  Wound #6 status is Open. Original cause of wound was Other Lesion. The date acquired was: 11/09/2021. The wound has been in treatment 2 weeks. The wound is located on the Left,Circumferential Forearm. The wound measures 10cm length x 32cm width x 0.2cm depth; 251.327cm^2 area and 50.265cm^3 volume. There is a medium amount of serosanguineous drainage noted. 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 There was a Excisional Skin/Subcutaneous Tissue Debridement with a total area of 6 sq cm performed by Tommie Sams., PA-C. With the following instrument(s): Curette Material removed includes Eschar, Subcutaneous Tissue, Slough, Skin: Dermis, and Skin: Epidermis. No specimens were taken. A time out was conducted at 14:27, prior to the start of the procedure. A Minimum amount of bleeding was controlled with Pressure. The procedure was tolerated well with a pain level of 0 throughout and a pain level of 0 following the procedure. Post  Debridement Measurements: 7cm length x 3cm width x 0.2cm depth; 3.299cm^3 volume. Character of Wound/Ulcer Post Debridement is improved. Post procedure Diagnosis Wound #1: Same as Pre-Procedure Wound #5 Pre-procedure diagnosis of Wound #5 is an Atypical located on the Right,Circumferential Forearm . There was a Excisional Skin/Subcutaneous Tissue Debridement with a total area of 4 sq cm performed by Tommie Sams., PA-C. With the following instrument(s): Curette Material removed includes Eschar, Subcutaneous Tissue, Slough, Skin: Dermis, and Skin: Epidermis. No specimens were taken. A time out was conducted at 14:27, prior to the start of the SAURAV, CRUMBLE ([id number]) [id number] Page 9 of 11 procedure. A Minimum amount of bleeding was controlled with Pressure. The procedure was tolerated well with a pain level of 0 throughout and a pain level of 0 following the procedure. Post Debridement Measurements: 11cm length x 32cm width x 0.2cm depth; 55.292cm^3 volume. Character of Wound/Ulcer Post Debridement is improved. Post procedure Diagnosis Wound #5: Same as Pre-Procedure Plan Follow-up Appointments: Return Appointment in 1 week. Bathing/ Shower/ Hygiene: May shower; gently cleanse wound with antibacterial soap, rinse and pat dry prior to dressing wounds Edema Control - Lymphedema / Segmental Compressive Device / Other: Elevate, Exercise Daily and Avoid Standing for Long Periods of Time. Elevate legs to the level of the heart and pump ankles as often as possible Elevate leg(s) parallel to the floor when sitting. WOUND #1: - Lower Leg Wound Laterality: Right, Lateral Cleanser: Byram Ancillary Kit - 15 Day Supply (Generic) 3 x Per Week/30 Days Discharge Instructions: Use supplies as instructed; Kit contains: (15) Saline Bullets; (15) 3x3 Gauze; 15 pr Gloves Cleanser: Soap and Water 3 x Per Week/30 Days Discharge Instructions: Gently cleanse wound with antibacterial  soap, rinse and pat dry prior to dressing wounds Topical: Activon Honey Gel, 25 (g) Tube 3 x Per Week/30 Days Prim Dressing: Xeroform 4x4-HBD (in/in) (Generic) 3 x Per Week/30 Days ary Discharge Instructions: Apply Xeroform 4x4-HBD (in/in) as directed Secondary Dressing: ABD Pad 5x9 (in/in) 3 x Per Week/30 Days Discharge Instructions: Cover with ABD pad Secured With: Medipore T - 71M Medipore H Soft Cloth  Surgical T ape ape, 2x2 (in/yd) (Generic) 3 x Per Week/30 Days Secured With: [employer] Sterile or Non-Sterile 6-ply 4.5x4 (yd/yd) (Generic) 3 x Per Week/30 Days Discharge Instructions: Apply Kerlix as directed WOUND #2: - Lower Leg Wound Laterality: Right, Medial Cleanser: [employer]ram Ancillary Kit - 15 Day Supply (Generic) 3 x Per Week/30 Days Discharge Instructions: Use supplies as instructed; Kit contains: (15) Saline Bullets; (15) 3x3 Gauze; 15 pr Gloves Cleanser: Soap and Water 3 x Per Week/30 Days Discharge Instructions: Gently cleanse wound with antibacterial soap, rinse and pat dry prior to dressing wounds Topical: Activon Honey Gel, 25 (g) Tube 3 x Per Week/30 Days Prim Dressing: Xeroform 4x4-HBD (in/in) (Generic) 3 x Per Week/30 Days ary Discharge Instructions: Apply Xeroform 4x4-HBD (in/in) as directed Secondary Dressing: ABD Pad 5x9 (in/in) 3 x Per Week/30 Days Discharge Instructions: Cover with ABD pad Secured With: Medipore T - 18M Medipore H Soft Cloth Surgical T ape ape, 2x2 (in/yd) (Generic) 3 x Per Week/30 Days Secured With: [employer] Sterile or Non-Sterile 6-ply 4.5x4 (yd/yd) (Generic) 3 x Per Week/30 Days Discharge Instructions: Apply Kerlix as directed WOUND #3: - Lower Leg Wound Laterality: Left, Medial Cleanser: [employer]ram Ancillary Kit - 15 Day Supply (Generic) 3 x Per Week/30 Days Discharge Instructions: Use supplies as instructed; Kit contains: (15) Saline Bullets; (15) 3x3 Gauze; 15 pr Gloves Cleanser: Soap and Water 3 x Per Week/30 Days Discharge Instructions: Gently  cleanse wound with antibacterial soap, rinse and pat dry prior to dressing wounds Topical: Activon Honey Gel, 25 (g) Tube 3 x Per Week/30 Days Prim Dressing: Xeroform 4x4-HBD (in/in) (Generic) 3 x Per Week/30 Days ary Discharge Instructions: Apply Xeroform 4x4-HBD (in/in) as directed Secondary Dressing: ABD Pad 5x9 (in/in) 3 x Per Week/30 Days Discharge Instructions: Cover with ABD pad Secured With: Medipore T - 18M Medipore H Soft Cloth Surgical T ape ape, 2x2 (in/yd) (Generic) 3 x Per Week/30 Days Secured With: [employer] Sterile or Non-Sterile 6-ply 4.5x4 (yd/yd) (Generic) 3 x Per Week/30 Days Discharge Instructions: Apply Kerlix as directed WOUND #4: - Lower Leg Wound Laterality: Left, Lateral Cleanser: [employer] Ancillary Kit - 15 Day Supply (Generic) 3 x Per Week/30 Days Discharge Instructions: Use supplies as instructed; Kit contains: (15) Saline Bullets; (15) 3x3 Gauze; 15 pr Gloves Cleanser: Soap and Water 3 x Per Week/30 Days Discharge Instructions: Gently cleanse wound with antibacterial soap, rinse and pat dry prior to dressing wounds Topical: Activon Honey Gel, 25 (g) Tube 3 x Per Week/30 Days Prim Dressing: Xeroform 4x4-HBD (in/in) (Generic) 3 x Per Week/30 Days ary Discharge Instructions: Apply Xeroform 4x4-HBD (in/in) as directed Secondary Dressing: ABD Pad 5x9 (in/in) 3 x Per Week/30 Days Discharge Instructions: Cover with ABD pad Secured With: Medipore T - 18M Medipore H Soft Cloth Surgical T ape ape, 2x2 (in/yd) (Generic) 3 x Per Week/30 Days Secured With: [employer] Sterile or Non-Sterile 6-ply 4.5x4 (yd/yd) (Generic) 3 x Per Week/30 Days Discharge Instructions: Apply Kerlix as directed WOUND #5: - Forearm Wound Laterality: Right, Circumferential Cleanser: Byram Ancillary Kit - 15 Day Supply (Generic) 3 x Per Week/30 Days Discharge Instructions: Use supplies as instructed; Kit contains: (15) Saline Bullets; (15) 3x3 Gauze; 15 pr Gloves Cleanser: Soap and Water 3 x Per  Week/30 Days Discharge Instructions: Gently cleanse wound with antibacterial soap, rinse and pat dry prior to dressing wounds Topical: Activon Honey Gel, 25 (g) Tube 3 x Per Week/30 Days Prim Dressing: Xeroform 4x4-HBD (in/in) (Generic) 3 x Per Week/30 Days ary Discharge Instructions:  Apply Xeroform 4x4-HBD (in/in) as directed Secondary Dressing: ABD Pad 5x9 (in/in) 3 x Per Week/30 Days Discharge Instructions: Cover with ABD pad Secured With: Medipore T - 68M Medipore H Soft Cloth Surgical T ape ape, 2x2 (in/yd) (Generic) 3 x Per Week/30 Days Secured With: [employer] Sterile or Non-Sterile 6-ply 4.5x4 (yd/yd) (Generic) 3 x Per Week/30 Days Discharge Instructions: Apply Kerlix as directed WOUND #6: - Forearm Wound Laterality: Left, Circumferential Cleanser: [employer]ram Ancillary Kit - 15 Day Supply (Generic) 3 x Per Week/30 Days Discharge Instructions: Use supplies as instructed; Kit contains: (15) Saline Bullets; (15) 3x3 Gauze; 15 pr Gloves Cleanser: Soap and Water 3 x Per Week/30 Days Discharge Instructions: Gently cleanse wound with antibacterial soap, rinse and pat dry prior to dressing wounds Topical: Activon Honey Gel, 25 (g) Tube 3 x Per Week/30 Days Burkel, [name] ([id number]) [id number] Page 10 of 11 Prim Dressing: Xeroform 4x4-HBD (in/in) (Generic) 3 x Per Week/30 Days ary Discharge Instructions: Apply Xeroform 4x4-HBD (in/in) as directed Secondary Dressing: ABD Pad 5x9 (in/in) 3 x Per Week/30 Days Discharge Instructions: Cover with ABD pad Secured With: Medipore T - 68M Medipore H Soft Cloth Surgical T ape ape, 2x2 (in/yd) (Generic) 3 x Per Week/30 Days Secured With: [employer] Sterile or Non-Sterile 6-ply 4.5x4 (yd/yd) (Generic) 3 x Per Week/30 Days Discharge Instructions: Apply Kerlix as directed 1. I am going to recommend that we have the patient continue to monitor for any signs of infection. Obviously right now I think we are making good progress as  far as his wounds are concerned I did perform debridement to clearway some of the thicker eschar areas although I could not get all the areas cleared away some of them were much more painful. Postdebridement though and the areas that were cleaned away he appears to be doing much better. 2. I am also can recommend that we continue with the Medihoney to cover followed by Xeroform. Over the areas that are pretty much done I recommended just using some triple antibiotic ointment which will save him a little bit of money I do believe. We will see patient back for reevaluation in 1 week here in the clinic. If anything worsens or changes patient will contact our office for additional recommendations. Electronic Signature(s) Signed: 08/31/2022 2:43:13 PM By: Worthy Keeler PA-C Entered By: Worthy Keeler on 08/31/2022 14:43:13 -------------------------------------------------------------------------------- SuperBill Details Patient Name: Date of Service: VISHWA, DAIS 08/31/2022 Medical Record Number: [id number] Patient Account Number: [id number] Date of Birth/Sex: Treating RN: Sep 19, 1981 (41 y.o. 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, unspecified L97.812 Non-pressure chronic ulcer of other part of right lower leg with fat layer exposed L97.822 Non-pressure chronic ulcer of other part of left lower leg with fat layer exposed L98.492 Non-pressure chronic ulcer of skin of other sites with fat layer exposed L03.113 Cellulitis of right upper limb L03.114 Cellulitis of left upper limb L03.115 Cellulitis of right lower limb L03.116 Cellulitis of left lower limb Facility Procedures : CPT4 Code:  [id number] Description: 48270 - DEB SUBQ TISSUE 20 SQ CM/< ICD-10 Diagnosis Description B86.754 Non-pressure chronic ulcer of other part of right lower leg with fat layer expo L98.492 Non-pressure chronic ulcer of skin of other sites with fat layer exposed Modifier: sed Quantity: 1 Physician Procedures : CPT4 Code Description Modifier [id number] -  WC PHYS SUBQ TISS 20 SQ CM ICD-10 Diagnosis Description Z00.174 Non-pressure chronic ulcer of other part of right lower leg with fat layer exposed L98.492 Non-pressure chronic ulcer of skin of other sites  with fat layer exposed Faucett, [name] ([id number]) [id number] Page 11 o Quantity: 1 f 11 Electronic Signature(s) Signed: 08/31/2022 2:43:29 PM By: Worthy Keeler PA-C Entered By: Worthy Keeler on 08/31/2022 14:43:29

## 2022-09-07 ENCOUNTER — Encounter: Payer: Medicare PPO | Admitting: Physician Assistant

## 2022-09-07 DIAGNOSIS — L03115 Cellulitis of right lower limb: Secondary | ICD-10-CM | POA: Diagnosis not present

## 2022-09-07 DIAGNOSIS — L03116 Cellulitis of left lower limb: Secondary | ICD-10-CM | POA: Diagnosis not present

## 2022-09-07 DIAGNOSIS — L03114 Cellulitis of left upper limb: Secondary | ICD-10-CM | POA: Diagnosis not present

## 2022-09-07 DIAGNOSIS — L03113 Cellulitis of right upper limb: Secondary | ICD-10-CM | POA: Diagnosis not present

## 2022-09-07 DIAGNOSIS — F19959 Other psychoactive substance use, unspecified with psychoactive substance-induced psychotic disorder, unspecified: Secondary | ICD-10-CM | POA: Diagnosis not present

## 2022-09-07 DIAGNOSIS — L97812 Non-pressure chronic ulcer of other part of right lower leg with fat layer exposed: Secondary | ICD-10-CM | POA: Diagnosis not present

## 2022-09-07 DIAGNOSIS — L98492 Non-pressure chronic ulcer of skin of other sites with fat layer exposed: Secondary | ICD-10-CM | POA: Diagnosis not present

## 2022-09-07 DIAGNOSIS — L97822 Non-pressure chronic ulcer of other part of left lower leg with fat layer exposed: Secondary | ICD-10-CM | POA: Diagnosis not present

## 2022-09-07 NOTE — Progress Notes (Signed)
DAMANI, KELEMEN ([id number]) [id number] Page 1 of 8 Visit Report for 09/07/2022 Chief Complaint Document Details Patient Name: Date of Service: [name], [name] 09/07/2022 2:30 PM Medical Record Number: [id number] Patient Account Number: [id number] Date of Birth/Sex: Treating RN: 1981/02/20 (41 y.o. 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 Seward Meth Primary Care Provider: Crissie Figures Other Clinician: Massie Kluver Referring Provider: Treating Provider/Extender: Guy Begin Weeks in Treatment: 3 Debridement Performed for Assessment: Wound #4 Left,Lateral Lower Leg Performed By: Physician Tommie Sams., PA-C Debridement Type: Debridement Level of Consciousness (Pre-procedure): Awake and Alert Pre-procedure Verification/Time Out Yes - 15:30 Taken: Start Time: 15:30 T Area Debrided (L x W): otal 1.5 (cm) x 1 (cm) = 1.5 (cm) Tissue and other material debrided: Viable, Non-Viable, Slough, Subcutaneous, Slough Level: Skin/Subcutaneous Tissue Debridement Description: Excisional Instrument: Curette Bleeding: Minimum Hemostasis Achieved: Pressure End Time: 15:32 Response to Treatment: Procedure was tolerated well Level of Consciousness (Post- Awake and  Alert procedure): [name], [name] ([id number]) [id number] Page 2 of 8 Post Debridement Measurements of Total Wound Length: (cm) 3.8 Width: (cm) 5 Depth: (cm) 0.2 Volume: (cm) 2.985 Character of Wound/Ulcer Post Debridement: Stable Post Procedure Diagnosis Same as Pre-procedure Electronic Signature(s) Unsigned Entered ByMassie Kluver on 09/07/2022 15:32:19 -------------------------------------------------------------------------------- Debridement Details Patient Name: Date of Service: [name], [name] 09/07/2022 2:30 PM Medical Record Number: [id number] Patient Account Number: [id number] Date of Birth/Sex: Treating RN: [birth year] (41 y.o. Seward Meth Primary Care Provider: Crissie Figures Other Clinician: Massie Kluver Referring Provider: Treating Provider/Extender: Guy Begin Weeks in Treatment: 3 Debridement Performed for Assessment: Wound #1 Right,Lateral Lower Leg Performed By: Physician Tommie Sams., PA-C Debridement Type: Debridement Level of Consciousness (Pre-procedure): Awake and Alert Pre-procedure Verification/Time Out Yes - 15:32 Taken: Start Time: 15:32 T Area Debrided (L x W): otal 2 (cm) x 2 (cm) = 4 (cm) Tissue and other material debrided: Viable, Non-Viable, Slough, Subcutaneous, Biofilm, Slough Level: Skin/Subcutaneous Tissue Debridement Description: Excisional Instrument: Curette Bleeding: Minimum Hemostasis Achieved: Pressure End Time: 15:33 Response to Treatment: Procedure was tolerated well Level of Consciousness (Post- Awake and Alert procedure): Post Debridement Measurements of Total Wound Length: (cm) 5.5 Width: (cm) 2.2 Depth: (cm) 0.2 Volume: (cm) 1.901 Character of Wound/Ulcer Post Debridement: Stable Post Procedure Diagnosis Same as Pre-procedure Electronic Signature(s) Unsigned Entered By: Massie Kluver on 09/07/2022 15:33:37 Signature(s): [name], [name] ([id number])  [id number] Date(s): [id number] Page 3 of 8 -------------------------------------------------------------------------------- Debridement Details Patient Name: Date of Service: [name], [name] 09/07/2022 2:30 PM Medical Record Number: [id number] Patient Account Number: [id number] Date of Birth/Sex: Treating RN: Oct 07, 1981 (41 y.o. Seward Meth Primary Care Provider: Crissie Figures Other Clinician: Massie Kluver Referring Provider: Treating Provider/Extender: Guy Begin Weeks in Treatment: 3 Debridement Performed for Assessment: Wound #5 Right,Circumferential Forearm Performed By: Physician Tommie Sams., PA-C Debridement Type: Debridement Level of Consciousness (Pre-procedure): Awake and Alert Pre-procedure Verification/Time Out Yes - 15:33 Taken: Start Time: 15:33 T Area Debrided (L x W): otal 5 (cm) x 5 (cm) = 25 (cm) Tissue and  other material debrided: Viable, Non-Viable, Eschar, Slough, Subcutaneous, Slough Level: Skin/Subcutaneous Tissue Debridement Description: Excisional Instrument: Curette Bleeding: Minimum Hemostasis Achieved: Pressure End Time: 15:35 Response to Treatment: Procedure was tolerated well Level of Consciousness (Post- Awake and Alert procedure): Post Debridement Measurements of Total Wound Length: (cm) 11.5 Width: (cm) 7.5 Depth: (cm) 0.3 Volume: (cm) 20.322 Character of Wound/Ulcer Post Debridement: Stable Post Procedure Diagnosis Same as Pre-procedure Electronic Signature(s) Unsigned Entered By: Massie Kluver on 09/07/2022 15:35:52 -------------------------------------------------------------------------------- Debridement Details Patient Name: Date of Service: [name], [name] 09/07/2022 2:30 PM Medical Record Number: [id number] Patient Account Number: [id number] Date of Birth/Sex: Treating RN: 1981/03/13 (41 y.o. [name], [name], [name] ([id number]) [id number] Page 4 of  8 Primary Care Provider: Crissie Figures Other Clinician: Massie Kluver Referring Provider: Treating Provider/Extender: Guy Begin Weeks in Treatment: 3 Debridement Performed for Assessment: Wound #6 Left,Circumferential Forearm Performed By: Physician Tommie Sams., PA-C Debridement Type: Debridement Level of Consciousness (Pre-procedure): Awake and Alert Pre-procedure Verification/Time Out Yes - 15:36 Taken: Start Time: 15:36 T Area Debrided (L x W): otal 1.5 (cm) x 1.5 (cm) = 2.25 (cm) Tissue and other material debrided: Viable, Non-Viable, Slough, Subcutaneous, Slough Level: Skin/Subcutaneous Tissue Debridement Description: Excisional Instrument: Curette Bleeding: Minimum Hemostasis Achieved: Pressure End Time: 15:37 Response to Treatment: Procedure was tolerated well Level of Consciousness (Post- Awake and Alert procedure): Post Debridement Measurements of Total Wound Length: (cm) 14.3 Width: (cm) 7.6 Depth: (cm) 0.2 Volume: (cm) 17.071 Character of Wound/Ulcer Post Debridement: Stable Post Procedure Diagnosis Same as Pre-procedure Electronic Signature(s) Unsigned Entered By: Massie Kluver on 09/07/2022 15:38:56 -------------------------------------------------------------------------------- HPI Details Patient Name: Date of Service: [name], [name] 09/07/2022 2:30 PM Medical Record Number: [id number] Patient Account Number: [id number] Date of Birth/Sex: Treating RN: [birth year] (41 y.o. Seward Meth Primary Care Provider: Crissie Figures Other Clinician: Massie Kluver Referring Provider: Treating Provider/Extender: Guy Begin Weeks in Treatment: 3 History of Present Illness HPI Description: 08-17-2022 upon evaluation today patient has multiple wounds over the bilateral arms and lower extremities. 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 He tells me that he has had the wounds on the legs for  quite some time and he has not used in that area in the past 6 months. Nonetheless he also has not been able to get him to heal based on what he tells me. He does tell me though that he continues to use and inject the fentanyl although last time was a week ago. It has been discussed with him when he was in the hospital going into rehabilitation. With that being said he tells me the last time he did this that it really was not good and really was not helpful. Nonetheless I do believe this is still something that needs to be strongly considered and I had a really good conversation with him today in this regard. I discussed that further and the plan. In regard to the wounds themselves the patient unfortunately is having a lot of issues with necrotic eschar which is really tightly adhered in a lot of places. I do believe that he has sufficient blood flow to heal though I think this is just where the tissue honestly which just destroyed the paren from infiltrating fentanyl when he was injecting. This is good to be more difficult to heal due to the deeper damage and I discussed that with him today as well he is also had cellulitis although that looks to be resolving to me at this  point. The good news is he really does not have any other major medical problems other than substance abuse. OBRIEN, HUSKINS ([id number]) [id number] Page 5 of 8 08-24-2022 upon evaluation today patient appears to be doing well currently in regard to his wounds in fact he is actually showing signs of significant improvement a lot of the eschar as he is actually pulled off which I think have made a big difference. With that being said I think that we do need to try to debride away some of the areas today and he is in agreement with that plan. 08-31-2022 upon evaluation today patient appears to be doing well currently in regard to his wounds he is actually making some good progress here overall. Fortunately I  do not see any signs of active infection. I do think he is going require some debridement a couple of spots although a lot of the wounds were very close to complete closure which is great news. 09-07-2022 upon evaluation today patient appears to be doing well currently in regard to his wounds. Has been tolerating the dressing changes without complication. Everything seems to be measuring better and looking smaller. It is going require some sharp debridement pretty much across the board. Electronic Signature(s) Signed: 09/07/2022 3:43:48 PM By: Worthy Keeler PA-C Entered By: Worthy Keeler on 09/07/2022 15:43:48 -------------------------------------------------------------------------------- Physical Exam Details Patient Name: Date of Service: [name], [name] 09/07/2022 2:30 PM Medical Record Number: [id number] Patient Account Number: [id number] Date of Birth/Sex: Treating RN: 1981-06-01 (41 y.o. Seward Meth Primary Care Provider: Crissie Figures Other Clinician: Massie Kluver Referring Provider: Treating Provider/Extender: Guy Begin Weeks in Treatment: 3 Constitutional Well-nourished and well-hydrated in no acute distress. Respiratory normal breathing without difficulty. Psychiatric this patient is able to make decisions and demonstrates good insight into disease process. Alert and Oriented x 3. pleasant and cooperative. Notes Upon inspection patient's wound bed actually showed signs of good granulation and epithelization at this point. Fortunately I do not see any signs of infection I did perform debridement at all locations today and overall the patient is tolerating this well. Electronic Signature(s) Signed: 09/07/2022 3:44:08 PM By: Worthy Keeler PA-C Entered By: Worthy Keeler on 09/07/2022 15:44:08 -------------------------------------------------------------------------------- Physician Orders Details Patient Name: Date of Service: [name], [name]  09/07/2022 2:30 PM Medical Record Number: [id number] Patient Account Number: [id number] Date of Birth/Sex: Treating RN: 1981-09-19 (41 y.o. 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 Bathing/ Shower/ Hygiene May shower; gently cleanse wound with antibacterial soap, rinse and pat dry prior to dressing wounds Edema Control - Lymphedema / Segmental Compressive Device / Other Elevate, Exercise Daily and A void Standing for Long Periods of Time. Elevate legs to the level of the heart and pump ankles as often as possible Elevate leg(s) parallel to  the floor when sitting. Wound Treatment Wound #1 - Lower Leg Wound Laterality: Right, Lateral Cleanser: [employer] Ancillary Kit - 15 Day Supply (Generic) 3 x Per Week/30 Days Discharge Instructions: Use supplies as instructed; Kit contains: (15) Saline Bullets; (15) 3x3 Gauze; 15 pr Gloves Cleanser: Soap and Water 3 x Per Week/30 Days Discharge Instructions: Gently cleanse wound with antibacterial soap, rinse and pat dry prior to dressing wounds Topical: Activon Honey Gel, 25 (g)  Tube 3 x Per Week/30 Days Prim Dressing: Xeroform 4x4-HBD (in/in) (Generic) 3 x Per Week/30 Days ary Discharge Instructions: Apply Xeroform 4x4-HBD (in/in) as directed Secured With: Medipore T - 64M Medipore H Soft Cloth Surgical T ape ape, 2x2 (in/yd) (Generic) 3 x Per Week/30 Days Secured With: [employer] Sterile or Non-Sterile 6-ply 4.5x4 (yd/yd) (Generic) 3 x Per Week/30 Days Discharge Instructions: Apply Kerlix as directed Secured With: [employer] Dressing, Latex-free, Size 5, Small-Head / Shoulder / Thigh 3 x Per Week/30 Days Wound #4 - Lower Leg Wound Laterality: Left, Lateral Cleanser: [employer]ram Ancillary Kit - 15 Day Supply (Generic) 3 x Per Week/30 Days Discharge Instructions: Use supplies as instructed; Kit contains: (15) Saline Bullets; (15) 3x3 Gauze; 15 pr Gloves Cleanser: Soap and Water 3 x Per Week/30 Days Discharge Instructions: Gently cleanse wound with antibacterial soap, rinse and pat dry prior to dressing wounds Topical: Activon Honey Gel, 25 (g) Tube 3 x Per Week/30 Days Prim Dressing: Xeroform 4x4-HBD (in/in) (Generic) 3 x Per Week/30 Days ary Discharge Instructions: Apply Xeroform 4x4-HBD (in/in) as directed Secured With: Medipore T - 64M Medipore H Soft Cloth Surgical T ape ape, 2x2 (in/yd) (Generic) 3 x Per Week/30 Days Secured With: [employer] Sterile or Non-Sterile 6-ply 4.5x4 (yd/yd) (Generic) 3 x Per Week/30 Days Discharge Instructions: Apply Kerlix as directed Secured With: [employer] Dressing, Latex-free, Size 5, Small-Head / Shoulder / Thigh 3 x Per Week/30 Days Wound #5 - Forearm Wound Laterality: Right, Circumferential Cleanser: [employer] Ancillary Kit - 15 Day Supply (Generic) 3 x Per Week/30 Days Discharge Instructions: Use supplies as instructed; Kit contains: (15) Saline Bullets; (15) 3x3 Gauze; 15 pr Gloves Cleanser: Soap and Water 3 x Per Week/30 Days Discharge Instructions: Gently cleanse wound with antibacterial soap, rinse and pat dry prior to dressing  wounds Topical: Activon Honey Gel, 25 (g) Tube 3 x Per Week/30 Days Oriordan, [name] ([id number]) [id number] Page 7 of 8 Prim Dressing: Xeroform 4x4-HBD (in/in) (Generic) 3 x Per Week/30 Days ary Discharge Instructions: Apply Xeroform 4x4-HBD (in/in) as directed Secured With: Medipore T - 64M Medipore H Soft Cloth Surgical T ape ape, 2x2 (in/yd) (Generic) 3 x Per Week/30 Days Secured With: [employer] Sterile or Non-Sterile 6-ply 4.5x4 (yd/yd) (Generic) 3 x Per Week/30 Days Discharge Instructions: Apply Kerlix as directed Secured With: [employer] Dressing, Latex-free, Size 5, Small-Head / Shoulder / Thigh 3 x Per Week/30 Days Wound #6 - Forearm Wound Laterality: Left, Circumferential Cleanser: [employer] Ancillary Kit - 15 Day Supply (Generic) 3 x Per Week/30 Days Discharge Instructions: Use supplies as instructed; Kit contains: (15) Saline Bullets; (15) 3x3 Gauze; 15 pr Gloves Cleanser: Soap and Water 3 x Per Week/30 Days Discharge Instructions: Gently cleanse wound with antibacterial soap, rinse and pat dry prior to dressing wounds Topical: Activon Honey Gel, 25 (g) Tube 3 x Per Week/30 Days Prim Dressing: Xeroform 4x4-HBD (in/in) (Generic) 3 x Per Week/30 Days ary Discharge Instructions: Apply Xeroform 4x4-HBD (in/in) as directed Secured With: Medipore T - 64M Medipore H Soft Cloth Surgical T  ape ape, 2x2 (in/yd) (Generic) 3 x Per Week/30 Days Secured With: [employer] Sterile or Non-Sterile 6-ply 4.5x4 (yd/yd) (Generic) 3 x Per Week/30 Days Discharge Instructions: Apply Kerlix as directed Secured With: [employer] Dressing, Latex-free, Size 5, Small-Head / Shoulder / Thigh 3 x Per Week/30 Days Electronic Signature(s) Unsigned Entered By: Massie Kluver on 09/07/2022 15:39:22 -------------------------------------------------------------------------------- Problem List Details Patient Name: Date of Service: [name], [name] 09/07/2022 2:30 PM Medical Record Number:  [id number] Patient Account Number: [id number] Date of Birth/Sex: Treating RN: 1981-03-17 (41 y.o. 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

## 2022-09-08 DIAGNOSIS — Z114 Encounter for screening for human immunodeficiency virus [HIV]: Secondary | ICD-10-CM | POA: Diagnosis not present

## 2022-09-08 DIAGNOSIS — R300 Dysuria: Secondary | ICD-10-CM | POA: Diagnosis not present

## 2022-09-08 DIAGNOSIS — Z113 Encounter for screening for infections with a predominantly sexual mode of transmission: Secondary | ICD-10-CM | POA: Diagnosis not present

## 2022-09-08 NOTE — Progress Notes (Signed)
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 Slough/Fibrino Yes Wound Bed Granulation Amount: Medium (34-66%) Exposed Structure Granulation Quality: Red, Pink Fascia Exposed: No Divirgilio, Red ([id number]) [id number] Page 14 of 16 Necrotic Amount: Medium (34-66%) Fat Layer (Subcutaneous Tissue) Exposed: Yes Necrotic Quality: Adherent Slough Tendon Exposed: No Muscle Exposed: No Joint Exposed: No Bone Exposed: No Treatment Notes Wound #5 (Forearm) Wound Laterality: Right, Circumferential Cleanser Byram Ancillary Kit - 15 Day Supply Discharge Instruction: Use supplies as instructed;  Kit contains: (15) Saline Bullets; (15) 3x3 Gauze; 15 pr Gloves Soap and Water Discharge Instruction: Gently cleanse wound with antibacterial soap, rinse and pat dry prior to dressing wounds Peri-Wound Care Topical Activon Honey Gel, 25 (g) Tube Primary Dressing Xeroform 4x4-HBD (in/in) Discharge Instruction: Apply Xeroform 4x4-HBD (in/in) as directed Secondary Dressing Secured With [place] [facility] T ape ape, 2x2 (in/yd) Kerlix Roll Sterile or Non-Sterile 6-ply 4.5x4 (yd/yd) Discharge Instruction: Apply Kerlix as directed Stretch Net Dressing, Latex-free, Size 5, Small-Head / Shoulder / Thigh Compression Wrap Compression Stockings Add-Ons Electronic Signature(s) Signed: 09/07/2022 4:15:39 PM By: Rosalio Loud MSN RN CNS WTA Signed: 09/07/2022 5:32:10 PM By: Massie Kluver Entered By: Massie Kluver on 09/07/2022 15:20:00 -------------------------------------------------------------------------------- Wound Assessment Details Patient Name: Date of Service: ADONIAS, DEMORE 09/07/2022 2:30 PM Medical Record Number: [id number] Patient Account Number: [id number] Date of Birth/Sex: Treating RN: Apr 06, 1981 (41 y.o. [place] [facility] Primary Care Jaxxon Naeem: Crissie Figures Other Clinician: Massie Kluver Referring Hillery Bhalla: Treating Kerrilyn Azbill/Extender: Guy Begin Weeks in Treatment: 3 Wound Status Wound Number: 6 Primary Etiology: Atypical Wound Location: Left, Circumferential Forearm Wound Status: Open Wounding Event: Other Lesion Date Acquired: 11/09/2021 Weeks Of Treatment: 3 Clustered Wound: Yes Photos Saidi, [name] ([id number]) [id number] Page 15 of 16 Wound Measurements Length: (cm) 14.3 Width: (cm) 7.6 Depth: (cm) 0.1 Area: (cm) 85.357 Volume: (cm) 8.536 % Reduction in Area: 81.2% % Reduction in Volume: 90.6% Wound Description Classification: Full Thickness Without Exposed Support Exudate Amount:  Medium Exudate Type: Serosanguineous Exudate Color: red, brown Structures Treatment Notes Wound #6 (Forearm) Wound Laterality: Left, Circumferential Cleanser Byram Ancillary Kit - 15 Day Supply Discharge Instruction: Use supplies as instructed; Kit contains: (15) Saline Bullets; (15) 3x3 Gauze; 15 pr Gloves  Soap and Water Discharge Instruction: Gently cleanse wound with antibacterial soap, rinse and pat dry prior to dressing wounds Peri-Wound Care Topical Activon Honey Gel, 25 (g) Tube Primary Dressing Xeroform 4x4-HBD (in/in) Discharge Instruction: Apply Xeroform 4x4-HBD (in/in) as directed Secondary Dressing Secured With [place] H Soft Cloth Surgical T ape ape, 2x2 (in/yd) Kerlix Roll Sterile or Non-Sterile 6-ply 4.5x4 (yd/yd) Discharge Instruction: Apply Kerlix as directed Stretch Net Dressing, Latex-free, Size 5, Small-Head / Shoulder / Thigh Compression Wrap Compression Stockings Add-Ons Electronic Signature(s) Signed: 09/07/2022 4:15:39 PM By: Rosalio Loud MSN RN CNS WTA Signed: 09/07/2022 5:32:10 PM By: Massie Kluver Entered By: Massie Kluver on 09/07/2022 15:21:07 Tandon, [name] ([id number]) [id number] Page 16 of 16 -------------------------------------------------------------------------------- Vitals Details Patient Name: Date of Service: TWAN, HARKIN 09/07/2022 2:30 PM Medical Record Number: [id number] Patient Account Number: [id number] Date of Birth/Sex: Treating RN: 02-13-1981 (41 y.o. [place] Meth Primary Care Joeph Szatkowski: Crissie Figures Other Clinician: Massie Kluver Referring Sheilyn Boehlke: Treating Lorrine [place]a/Extender: Guy Begin Weeks in Treatment: 3 Vital Signs Time Taken: 14:52 Temperature (F): 98.4 Height (in): 73 Pulse (bpm): 91 Weight (lbs): 230 Respiratory Rate (breaths/min): 16 Body Mass Index (BMI): 30.3 Blood Pressure (mmHg): 129/85 Reference Range: 80 - 120 mg / dl Electronic  Signature(s) Signed: 09/07/2022 5:32:10 PM By: Massie Kluver Entered By: Massie Kluver on 09/07/2022 14:54:14

## 2022-09-14 ENCOUNTER — Encounter: Payer: Medicare PPO | Attending: Physician Assistant | Admitting: Internal Medicine

## 2022-09-14 DIAGNOSIS — L97822 Non-pressure chronic ulcer of other part of left lower leg with fat layer exposed: Secondary | ICD-10-CM | POA: Diagnosis not present

## 2022-09-14 DIAGNOSIS — L03114 Cellulitis of left upper limb: Secondary | ICD-10-CM | POA: Diagnosis not present

## 2022-09-14 DIAGNOSIS — L03113 Cellulitis of right upper limb: Secondary | ICD-10-CM | POA: Diagnosis not present

## 2022-09-14 DIAGNOSIS — L03115 Cellulitis of right lower limb: Secondary | ICD-10-CM | POA: Insufficient documentation

## 2022-09-14 DIAGNOSIS — L97812 Non-pressure chronic ulcer of other part of right lower leg with fat layer exposed: Secondary | ICD-10-CM | POA: Diagnosis not present

## 2022-09-14 DIAGNOSIS — L98492 Non-pressure chronic ulcer of skin of other sites with fat layer exposed: Secondary | ICD-10-CM | POA: Insufficient documentation

## 2022-09-14 DIAGNOSIS — L988 Other specified disorders of the skin and subcutaneous tissue: Secondary | ICD-10-CM | POA: Diagnosis not present

## 2022-09-14 DIAGNOSIS — I10 Essential (primary) hypertension: Secondary | ICD-10-CM | POA: Insufficient documentation

## 2022-09-14 DIAGNOSIS — L03116 Cellulitis of left lower limb: Secondary | ICD-10-CM | POA: Diagnosis not present

## 2022-09-15 NOTE — Progress Notes (Signed)
MEKEL, HAVERSTOCK ([id number]) [id number] Page 1 of 7 Visit Report for 09/14/2022 HPI Details Patient Name: Date of Service: Kirk, [name] 09/14/2022 2:00 PM Medical Record Number: [id number] Patient Account Number: [id number] Date of Birth/Sex: Treating RN: 1981-04-12 (41 y.o. [name] Primary Care Provider: Crissie [name] Other Clinician: Referring Provider: Treating Provider/Extender: RO BSO N, [name], [name] in Treatment: 4 History of Present Illness HPI Description: 08-17-2022 upon evaluation today patient has multiple wounds over the bilateral arms and lower extremities. 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 He tells me that he has had the wounds on the legs for quite some time and he has not used in that area in the past 6 months. Nonetheless he also has not been able to get him to heal based on what he tells me. He does tell me though that he continues to use and inject the fentanyl although last time was a week ago. It has been discussed with him when he was in the hospital going into rehabilitation. With that being said he tells me the last time he did this that it really was not good and really was not helpful. Nonetheless I do believe this is still something that needs to be strongly considered and I had a really good conversation with him today in this regard. I discussed that further and the plan. In regard to the wounds themselves the patient unfortunately is having a lot of issues with necrotic eschar which is really tightly adhered in a lot of places. I do believe that he has sufficient blood flow to heal though I think this is just where the tissue honestly which just destroyed the paren from infiltrating fentanyl when he was injecting. This is good to be more difficult to heal due to the deeper damage and I discussed that with him today as well he is also had cellulitis although that looks to be  resolving to me at this point. The good news is he really does not have any other major medical problems other than substance abuse. 08-24-2022 upon evaluation today patient appears to be doing well currently in regard to his wounds in fact he is actually showing signs of significant improvement a lot of the eschar as he is actually pulled off which I think have made a big difference. With that being said I think that we do need to try to debride away some of the areas today and he is in agreement with that plan. 08-31-2022 upon evaluation today patient appears to be doing well currently in regard to his wounds he is actually making some good progress here overall. Fortunately I do not see any signs of active infection. I do think he is going require some debridement a couple of spots although a lot of the wounds were very close to complete closure which is great news. 09-07-2022 upon evaluation today patient appears to be doing well currently in regard to his wounds. Has been tolerating the dressing changes without complication. Everything seems to be measuring better and looking smaller. It is going require some sharp debridement pretty much across the board. 11/6; patient here for wounds on his bilateral upper and lower extremities. He has been using Medihoney with Xeroform covering. These were initial wounds from using injectable fentanyl. The patient was told at 1 point that was laced with Xylazine. In any case his wounds look fairly good. Still 1 major open area on the right lateral leg right  forearm and a smaller area on the left forearm everything on the left leg seems to be just about epithelialized to me Electronic Signature(s) Signed: 09/14/2022 4:14:14 PM By: Kirk Ham MD Entered By: [name] on 09/14/2022 14:40:25 -------------------------------------------------------------------------------- Physical Exam Details Patient Name: Date of Service: Kirk, [name] 09/14/2022  2:00 PM Medical Record Number: [id number] Patient Account Number: [id number] Date of Birth/Sex: Treating RN: [birth year] (41 y.o. [name] Primary Care Provider: Crissie [name] Other Clinician: [name] ([id number]) [id number] Page 2 of 7 Referring Provider: Treating Provider/Extender: RO BSO N, [name] [name] in Treatment: 4 Constitutional Patient is hypertensive.. Pulse regular and within target range for patient.Marland Kitchen Respirations regular, non-labored and within target range.. Temperature is normal and within the target range for the patient.Marland Kitchen appears in no distress. Notes Wound exam; he has several areas on both lower extremities anteriorly and the dorsal part of both forearms. Currently everything looks quite good by description. He still has a fairly large area [about the size of a quarter on the right lateral lower leg small area on the right forearm and an even smaller area on the left forearm. Almost everything else is epithelialized. I see no evidence of infection Electronic Signature(s) Signed: 09/14/2022 4:14:14 PM By: Kirk Ham MD Entered By: [name] on 09/14/2022 14:41:39 -------------------------------------------------------------------------------- Physician Orders Details Patient Name: Date of Service: Kirk, [name] 09/14/2022 2:00 PM Medical Record Number: [id number] Patient Account Number: [id number] Date of Birth/Sex: Treating RN: 1981-03-07 (41 y.o. [name] Primary Care Provider: Crissie [name] Other Clinician: Referring Provider: Treating Provider/Extender: RO BSO N, [name] [name] in Treatment: 4 Verbal / Phone Orders: No Diagnosis Coding Follow-up Appointments Return Appointment in 1 week. Bathing/ Shower/ Hygiene May shower; gently cleanse wound with antibacterial soap, rinse and pat dry prior to dressing wounds Edema Control - Lymphedema / Segmental Compressive  Device / Other Elevate, Exercise Daily and A void Standing for Long Periods of Time. Elevate legs to the level of the heart and pump ankles as often as possible Elevate leg(s) parallel to the floor when sitting. Wound Treatment Wound #1 - Lower Leg Wound Laterality: Right, Lateral Cleanser: [employer] Ancillary Kit - 15 Day Supply (DME) (Generic) 3 x Per Week/30 Days Discharge Instructions: Use supplies as instructed; Kit contains: (15) Saline Bullets; (15) 3x3 Gauze; 15 pr Gloves Cleanser: Soap and Water 3 x Per Week/30 Days Discharge Instructions: Gently cleanse wound with antibacterial soap, rinse and pat dry prior to dressing wounds Topical: Activon Honey Gel, 25 (g) Tube 3 x Per Week/30 Days Prim Dressing: Xeroform 4x4-HBD (in/in) (DME) (Generic) 3 x Per Week/30 Days ary Discharge Instructions: Apply Xeroform 4x4-HBD (in/in) as directed Secured With: Medipore T - 41M Medipore H Soft Cloth Surgical T ape ape, 2x2 (in/yd) (DME) (Generic) 3 x Per Week/30 Days Secured With: [employer] Sterile or Non-Sterile 6-ply 4.5x4 (yd/yd) (DME) (Generic) 3 x Per Week/30 Days Discharge Instructions: Apply Kerlix as directed Secured With: Stretch Net Dressing, Latex-free, Size 5, Small-Head / Shoulder / Thigh 3 x Per Week/30 Days Wound #4 - Lower Leg Wound Laterality: Left, Lateral Cleanser: Byram Ancillary Kit - 15 Day Supply (Generic) 3 x Per Week/30 Days Tanney, [name] ([id number]) [id number] Page 3 of 7 Discharge Instructions: Use supplies as instructed; Kit contains: (15) Saline Bullets; (15) 3x3 Gauze; 15 pr Gloves Cleanser: Soap and Water 3 x Per Week/30 Days Discharge Instructions: Gently cleanse wound with antibacterial soap, rinse and pat dry prior to dressing wounds Topical:  Activon Honey Gel, 25 (g) Tube 3 x Per Week/30 Days Prim Dressing: Xeroform 4x4-HBD (in/in) (DME) (Generic) 3 x Per Week/30 Days ary Discharge Instructions: Apply Xeroform 4x4-HBD (in/in) as  directed Secured With: [employer] T ape ape, 2x2 (in/yd) (DME) (Generic) 3 x Per Week/30 Days Secured With: [employer] Sterile or Non-Sterile 6-ply 4.5x4 (yd/yd) (DME) (Generic) 3 x Per Week/30 Days Discharge Instructions: Apply Kerlix as directed Secured With: [employer] Dressing, Latex-free, Size 5, Small-Head / Shoulder / Thigh 3 x Per Week/30 Days Wound #5 - Forearm Wound Laterality: Right, Circumferential Cleanser: Byram Ancillary Kit - 15 Day Supply (DME) (Generic) 3 x Per Week/30 Days Discharge Instructions: Use supplies as instructed; Kit contains: (15) Saline Bullets; (15) 3x3 Gauze; 15 pr Gloves Cleanser: Soap and Water 3 x Per Week/30 Days Discharge Instructions: Gently cleanse wound with antibacterial soap, rinse and pat dry prior to dressing wounds Topical: Activon Honey Gel, 25 (g) Tube 3 x Per Week/30 Days Prim Dressing: Xeroform 4x4-HBD (in/in) (DME) (Generic) 3 x Per Week/30 Days ary Discharge Instructions: Apply Xeroform 4x4-HBD (in/in) as directed Secured With: Medipore T - 58M Medipore H Soft Cloth Surgical T ape ape, 2x2 (in/yd) (Generic) 3 x Per Week/30 Days Secured With: [employer] Sterile or Non-Sterile 6-ply 4.5x4 (yd/yd) (DME) (Generic) 3 x Per Week/30 Days Discharge Instructions: Apply Kerlix as directed Secured With: Stretch Net Dressing, Latex-free, Size 5, Small-Head / Shoulder / Thigh 3 x Per Week/30 Days Wound #6 - Forearm Wound Laterality: Left, Circumferential Cleanser: [employer] Ancillary Kit - 15 Day Supply (Generic) 3 x Per Week/30 Days Discharge Instructions: Use supplies as instructed; Kit contains: (15) Saline Bullets; (15) 3x3 Gauze; 15 pr Gloves Cleanser: Soap and Water 3 x Per Week/30 Days Discharge Instructions: Gently cleanse wound with antibacterial soap, rinse and pat dry prior to dressing wounds Topical: Activon Honey Gel, 25 (g) Tube 3 x Per Week/30 Days Prim Dressing: Xeroform 4x4-HBD (in/in) (DME) (Generic)  3 x Per Week/30 Days ary Discharge Instructions: Apply Xeroform 4x4-HBD (in/in) as directed Secured With: Medipore T - 58M Medipore H Soft Cloth Surgical T ape ape, 2x2 (in/yd) (Generic) 3 x Per Week/30 Days Secured With: Kerlix Roll Sterile or Non-Sterile 6-ply 4.5x4 (yd/yd) (Generic) 3 x Per Week/30 Days Discharge Instructions: Apply Kerlix as directed Secured With: Stretch Net Dressing, Latex-free, Size 5, Small-Head / Shoulder / Thigh 3 x Per Week/30 Days Electronic Signature(s) Signed: 09/14/2022 4:14:14 PM By: Kirk Ham MD Signed: 09/15/2022 8:00:00 AM By: Carlene Coria RN Entered By: Carlene Coria on 09/14/2022 14:40:07 Kirk, [name] ([id number]) [id number] Page 4 of 7 -------------------------------------------------------------------------------- Problem List Details Patient Name: Date of Service: Kirk, [name] 09/14/2022 2:00 PM Medical Record Number: [id number] Patient Account Number: [id number] Date of Birth/Sex: Treating RN: November 21, 1980 (41 y.o. 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/07/2022 No Yes L98.492 Non-pressure chronic ulcer of skin of other sites with fat layer exposed 08/17/2022 No Yes L03.113 Cellulitis of right upper limb 08/17/2022 No Yes L03.114 Cellulitis of left upper limb 08/17/2022  No Yes L03.115 Cellulitis of right lower limb 08/17/2022 No Yes L03.116 Cellulitis of left lower limb  08/17/2022 No Yes Inactive Problems Resolved Problems Electronic Signature(s) Signed: 09/14/2022 4:14:14 PM By: Kirk Ham MD Entered By: [name] on 09/14/2022 15:00:16 -------------------------------------------------------------------------------- Progress Note Details Patient Name: Date of Service: Kirk, [name] 09/14/2022 2:00 PM Medical Record Number: [id number] Patient Account Number: [id number] Kirk, [name] ([id number]) [id number] Page 5 of 7 Date of Birth/Sex: Treating RN: [birth year] (41 y.o. [name]) Carlene Coria Primary Care Provider: Other Clinician: Crissie [name] Referring Provider: Treating Provider/Extender: Eldridge Dace, Lane [name] Levi Aland, Brenna [name] in Treatment: 4 Subjective History of Present Illness (HPI) 08-17-2022 upon evaluation today patient has multiple wounds over the bilateral arms and lower extremities. 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 He tells me that he has had the wounds on the legs for quite some time and he has not used in that area in the past 6 months. Nonetheless he also has not been able to get him to heal based on what he tells me. He does tell me though that he continues to use and inject the fentanyl although last time was a week ago. It has been discussed with him when he was in the hospital going into rehabilitation. With that being said he tells me the last time he did this that it really was not good and really was not helpful. Nonetheless I do believe this is still something that needs to be strongly considered and I had a really good conversation with him today in this regard. I discussed that further and the plan. In regard to the wounds themselves the patient unfortunately is having a lot of issues with necrotic eschar which is really tightly adhered in a lot of places. I do believe that he has sufficient blood flow to heal though I think this is just where the tissue  honestly which just destroyed the paren from infiltrating fentanyl when he was injecting. This is good to be more difficult to heal due to the deeper damage and I discussed that with him today as well he is also had cellulitis although that looks to be resolving to me at this point. The good news is he really does not have any other major medical problems other than substance abuse. 08-24-2022 upon evaluation today patient appears to be doing well currently in regard to his wounds in fact he is actually showing signs of significant improvement a lot of the eschar as he is actually pulled off which I think have made a big difference. With that being said I think that we do need to try to debride away some of the areas today and he is in agreement with that plan. 08-31-2022 upon evaluation today patient appears to be doing well currently in regard to his wounds he is actually making some good progress here overall. Fortunately I do not see any signs of active infection. I do think he is going require some debridement a couple of spots although a lot of the wounds were very close to complete closure which is great news. 09-07-2022 upon evaluation today patient appears to be doing well currently in regard to his wounds. Has been tolerating the dressing changes without complication. Everything seems to be measuring better and looking smaller. It is going require some sharp debridement pretty much across the board. 11/6; patient here for wounds on his bilateral upper and lower extremities. He has been using Medihoney with Xeroform  covering. These were initial wounds from using injectable fentanyl. The patient was told at 1 point that was laced with Xylazine. In any case his wounds look fairly good. Still 1 major open area on the right lateral leg right forearm and a smaller area on the left forearm everything on the left leg seems to be just about epithelialized to me Objective Constitutional Patient is  hypertensive.. Pulse regular and within target range for patient.Marland Kitchen Respirations regular, non-labored and within target range.. Temperature is normal and within the target range for the patient.Marland Kitchen appears in no distress. Vitals Time Taken: 2:04 PM, Height: 73 in, Weight: 230 lbs, BMI: 30.3, Temperature: 98.4 F, Pulse: 120 bpm, Respiratory Rate: 16 breaths/min, Blood Pressure: 179/81 mmHg. General Notes: Wound exam; he has several areas on both lower extremities anteriorly and the dorsal part of both forearms. Currently everything looks quite good by description. He still has a fairly large area [about the size of a quarter on the right lateral lower leg small area on the right forearm and an even smaller area on the left forearm. Almost everything else is epithelialized. I see no evidence of infection Integumentary (Hair, Skin) Wound #1 status is Open. Original cause of wound was Other Lesion. The date acquired was: 11/09/2021. The wound has been in treatment 4 [name]. The wound is located on the Right,Lateral Lower Leg. The wound measures 2.5cm length x 2cm width x 0.1cm depth; 3.927cm^2 area and 0.393cm^3 volume. There is Fat Layer (Subcutaneous Tissue) exposed. There is no tunneling or undermining noted. There is a medium amount of serosanguineous drainage noted. There is medium (34-66%) red granulation within the wound bed. There is a medium (34-66%) amount of necrotic tissue within the wound bed including Adherent Slough. Wound #4 status is Open. Original cause of wound was Other Lesion. The date acquired was: 11/09/2021. The wound has been in treatment 4 [name]. The wound is located on the Left,Lateral Lower Leg. The wound measures 7cm length x 5cm width x 0.1cm depth; 27.489cm^2 area and 2.749cm^3 volume. There is Fat Layer (Subcutaneous Tissue) exposed. There is no tunneling or undermining noted. There is a medium amount of serosanguineous drainage noted. There is medium (34-66%) red granulation  within the wound bed. There is a medium (34-66%) amount of necrotic tissue within the wound bed including Adherent Slough. Wound #5 status is Open. Original cause of wound was Other Lesion. The date acquired was: 11/09/2021. The wound has been in treatment 4 [name]. The wound is located on the Right,Circumferential Forearm. The wound measures 14cm length x 8cm width x 0.2cm depth; 87.965cm^2 area and 17.593cm^3 volume. There is Fat Layer (Subcutaneous Tissue) exposed. There is no tunneling or undermining noted. There is a medium amount of serosanguineous drainage noted. There is medium (34-66%) red, pink granulation within the wound bed. There is a medium (34-66%) amount of necrotic tissue within the wound bed including Adherent Slough. Wound #6 status is Open. Original cause of wound was Other Lesion. The date acquired was: 11/09/2021. The wound has been in treatment 4 [name]. The wound is located on the Left,Circumferential Forearm. The wound measures 17cm length x 7cm width x 0.1cm depth; 93.462cm^2 area and 9.346cm^3 volume. There is Fat Layer (Subcutaneous Tissue) exposed. There is no tunneling or undermining noted. There is a medium amount of serosanguineous drainage noted. There is medium (34-66%) pink granulation within the wound bed. There is a medium (34-66%) amount of necrotic tissue within the wound bed including Adherent Slough. 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 Bathing/ Shower/ Hygiene: May shower; gently cleanse wound with antibacterial soap, rinse and pat dry prior to dressing wounds Edema Control - Lymphedema / Segmental Compressive Device / Other: Elevate, Exercise Daily and Avoid Standing for Long Periods of Time. Elevate legs to the level of the heart and pump ankles as often as possible Elevate leg(s) parallel to the floor when sitting. WOUND #1: - Lower Leg Wound Laterality: Right, Lateral Cleanser: [employer] Ancillary Kit - 15 Day Supply (DME) (Generic) 3 x Per Week/30 Days Discharge Instructions: Use supplies as instructed; Kit contains: (15) Saline Bullets; (15) 3x3 Gauze; 15 pr Gloves Cleanser: Soap and Water 3 x Per Week/30 Days Discharge Instructions: Gently cleanse wound with antibacterial soap, rinse and pat dry prior to dressing wounds Topical: Activon Honey Gel, 25 (g) Tube 3 x Per Week/30 Days Prim Dressing: Xeroform 4x4-HBD (in/in) (DME) (Generic) 3 x Per Week/30 Days ary Discharge Instructions: Apply Xeroform 4x4-HBD (in/in) as directed Secured With: Medipore T - 20M Medipore H Soft Cloth Surgical T ape ape, 2x2 (in/yd) (DME) (Generic) 3 x Per Week/30 Days Secured With: [employer] Sterile or Non-Sterile 6-ply 4.5x4 (yd/yd) (DME) (Generic) 3 x Per Week/30 Days Discharge Instructions: Apply Kerlix as directed Secured With: [employer] Dressing, Latex-free, Size 5, Small-Head / Shoulder / Thigh 3 x Per Week/30 Days WOUND #4: - Lower Leg Wound Laterality: Left, Lateral Cleanser: [employer] Ancillary Kit - 15 Day Supply (Generic) 3 x Per Week/30 Days Discharge Instructions: Use supplies as instructed; Kit contains: (15) Saline Bullets; (15) 3x3 Gauze; 15 pr Gloves Cleanser: Soap and Water 3 x Per Week/30 Days Discharge Instructions: Gently cleanse wound with antibacterial soap, rinse and pat dry prior to dressing wounds Topical: Activon Honey Gel, 25 (g) Tube 3 x  Per Week/30 Days Prim Dressing: Xeroform 4x4-HBD (in/in) (DME) (Generic) 3 x Per Week/30 Days ary Discharge Instructions: Apply Xeroform 4x4-HBD (in/in) as directed Secured With: Medipore T - 20M Medipore H Soft Cloth Surgical T ape ape, 2x2 (in/yd) (DME) (Generic) 3 x Per Week/30 Days Secured With: [employer] Sterile or Non-Sterile 6-ply 4.5x4 (yd/yd) (DME) (Generic) 3 x Per Week/30 Days Discharge Instructions: Apply Kerlix as directed Secured With: [employer] Dressing, Latex-free, Size 5, Small-Head / Shoulder / Thigh 3 x Per Week/30 Days WOUND #5: - Forearm Wound Laterality: Right, Circumferential Cleanser: [employer] Ancillary Kit - 15 Day Supply (DME) (Generic) 3 x Per Week/30 Days Discharge Instructions: Use supplies as instructed; Kit contains: (15) Saline Bullets; (15) 3x3 Gauze; 15 pr Gloves Cleanser: Soap and Water 3 x Per Week/30 Days Discharge Instructions: Gently cleanse wound with antibacterial soap, rinse and pat dry prior to dressing wounds Topical: Activon Honey Gel, 25 (g) Tube 3 x Per Week/30 Days Prim Dressing: Xeroform 4x4-HBD (in/in) (DME) (Generic) 3 x Per Week/30 Days ary Discharge Instructions: Apply Xeroform 4x4-HBD (in/in) as directed Secured With: Medipore T - 20M Medipore H Soft Cloth Surgical T ape ape, 2x2 (in/yd) (Generic) 3 x Per Week/30 Days Secured With: [employer]  Roll Sterile or Non-Sterile 6-ply 4.5x4 (yd/yd) (DME) (Generic) 3 x Per Week/30 Days Discharge Instructions: Apply Kerlix as directed Secured With: Stretch Net Dressing, Latex-free, Size 5, Small-Head / Shoulder / Thigh 3 x Per Week/30 Days WOUND #6: - Forearm Wound Laterality: Left, Circumferential Cleanser: [employer] Ancillary Kit - 15 Day Supply (Generic) 3 x Per Week/30 Days Discharge Instructions: Use supplies as instructed; Kit contains: (15) Saline Bullets; (15) 3x3 Gauze; 15 pr Gloves Cleanser: Soap and Water 3 x Per Week/30 Days Discharge Instructions: Gently cleanse wound with antibacterial soap,  rinse and pat dry prior to dressing wounds Topical: Activon Honey Gel, 25 (g) Tube 3 x Per Week/30 Days Prim Dressing: Xeroform 4x4-HBD (in/in) (DME) (Generic) 3 x Per Week/30 Days ary Discharge Instructions: Apply Xeroform 4x4-HBD (in/in) as directed Secured With: Medipore T - 9M Medipore H Soft Cloth Surgical T ape ape, 2x2 (in/yd) (Generic) 3 x Per Week/30 Days Secured With: [employer] Sterile or Non-Sterile 6-ply 4.5x4 (yd/yd) (Generic) 3 x Per Week/30 Days Discharge Instructions: Apply Kerlix as directed Secured With: [employer] Dressing, Latex-free, Size 5, Small-Head / Shoulder / Thigh 3 x Per Week/30 Days 1. The patient has been using Medihoney and Xeroform. Apparently they used too much supplies initially and according to our intake nurse will be eligible for more ordered supplies until next week. His mother has been paying for the supplies currently ordering them online. 2. Fortunately everything seems to be a lot better. Also fortunately he claims to be abstinent. Electronic Signature(s) Signed: 09/14/2022 4:14:14 PM By: Kirk Ham MD Entered By: [name] on 09/14/2022 14:44:02 [name], [name] ([id number]) [id number] Page 7 of 7 -------------------------------------------------------------------------------- SuperBill Details Patient Name: Date of Service: Kirk, [name] 09/14/2022 Medical Record Number: [id number] Patient Account Number: [id number] Date of Birth/Sex: Treating RN: 1981/05/11 (41 y.o. 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.492 Non-pressure chronic ulcer of skin of other sites with fat layer exposed L03.113 Cellulitis of right upper limb L03.114 Cellulitis of left upper limb L03.115 Cellulitis of right lower limb L03.116 Cellulitis of left lower limb Facility Procedures : CPT4 Code: [id number] Description: 07225 - WOUND CARE VISIT-LEV 5 EST PT Modifier: Quantity: 1 Physician Procedures : CPT4 Code Description Modifier [id number] - WC PHYS LEVEL 3 - EST PT ICD-10 Diagnosis Description 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

## 2022-09-15 NOTE — Progress Notes (Signed)
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 Aland, Brenna Weeks in Treatment: 4 Wound Status Wound Number: 6 Primary Etiology: Atypical Wound Location: Left, Circumferential Forearm Wound Status: Open Wounding Event: Other Lesion Date Acquired: 11/09/2021 Weeks Of Treatment: 4 Clustered Wound: Yes Photos Wound Measurements Length: (cm) 17 Width: (cm) 7 Depth: (cm) 0.1 Area: (cm) 93.462 Volume: (cm) 9.346 % Reduction in Area: 79.4% % Reduction in Volume: 89.7% Tunneling: No Undermining: No Wound Description Classification: Full Thickness Without Exposed S Exudate Amount: Medium Exudate Type: Serosanguineous Exudate Color: red, brown upport Structures Wound Bed Granulation Amount: Medium (34-66%) Exposed Structure Granulation Quality: Pink Fascia Exposed: No Necrotic Amount: Medium (34-66%) Fat Layer (Subcutaneous Tissue) Exposed: Yes Necrotic Quality: Adherent Slough Tendon Exposed: No Muscle Exposed: No Joint Exposed: No Bone Exposed: No Treatment Notes Wound #6 (Forearm) Wound Laterality: Left, Circumferential Cleanser Byram Ancillary Kit - 15 Day Supply Discharge Instruction: Use supplies as instructed;  Kit contains: (15) Saline Bullets; (15) 3x3 Gauze; 15 pr Gloves Soap and Water Discharge Instruction: Gently cleanse wound with antibacterial soap, rinse and pat dry prior to dressing wounds Peri-Wound Care Topical Activon Honey Gel, 25 (g) Tube Primary Dressing Xeroform 4x4-HBD (in/in) Discharge Instruction: Apply Xeroform 4x4-HBD (in/in) as directed Secondary Dressing Secured With [employer] T ape ape, 2x2 (in/yd) Kerlix Roll Sterile or Non-Sterile 6-ply 4.5x4 (yd/yd) Discharge Instruction: Apply Kerlix as directed Stretch Net Dressing, Latex-free, Size 5, Small-Head / Shoulder / Thigh Krouse, Royal ([id number]) [phone].pdf Page 14 of 14 Compression Wrap Compression Stockings Add-Ons Electronic Signature(s) Signed: 09/15/2022 8:00:00 AM By: Carlene Coria RN Entered By: Carlene Coria on 09/14/2022 14:14:24 -------------------------------------------------------------------------------- Vitals Details Patient Name: Date of Service: [name], [name] 09/14/2022 2:00 PM Medical Record Number: [id number] Patient Account Number: [id number] Date of Birth/Sex: Treating RN: 1980/12/17 (41 y.o. [name] Primary Care Nataliah Hatlestad: Crissie Figures Other Clinician: Referring Patrisha Hausmann: Treating Jessee Mezera/Extender: RO BSO N, Chicot McManus, Brenna Weeks in Treatment: 4 Vital Signs Time Taken: 14:04 Temperature (F): 98.4 Height (  in): 73 Pulse (bpm): 120 Weight (lbs): 230 Respiratory Rate (breaths/min): 16 Body Mass Index (BMI): 30.3 Blood Pressure (mmHg): 179/81 Reference Range: 80 - 120 mg / dl Electronic Signature(s) Signed: 09/15/2022 8:00:00 AM By: Carlene Coria RN Entered By: Carlene Coria on 09/14/2022 14:04:42

## 2022-09-17 DIAGNOSIS — F1120 Opioid dependence, uncomplicated: Secondary | ICD-10-CM | POA: Diagnosis not present

## 2022-09-20 DIAGNOSIS — F1120 Opioid dependence, uncomplicated: Secondary | ICD-10-CM | POA: Diagnosis not present

## 2022-09-21 ENCOUNTER — Encounter: Payer: Medicare PPO | Admitting: Physician Assistant

## 2022-09-21 DIAGNOSIS — L97812 Non-pressure chronic ulcer of other part of right lower leg with fat layer exposed: Secondary | ICD-10-CM | POA: Diagnosis not present

## 2022-09-21 DIAGNOSIS — L98492 Non-pressure chronic ulcer of skin of other sites with fat layer exposed: Secondary | ICD-10-CM | POA: Diagnosis not present

## 2022-09-21 DIAGNOSIS — L97822 Non-pressure chronic ulcer of other part of left lower leg with fat layer exposed: Secondary | ICD-10-CM | POA: Diagnosis not present

## 2022-09-21 DIAGNOSIS — L03116 Cellulitis of left lower limb: Secondary | ICD-10-CM | POA: Diagnosis not present

## 2022-09-21 DIAGNOSIS — L03114 Cellulitis of left upper limb: Secondary | ICD-10-CM | POA: Diagnosis not present

## 2022-09-21 DIAGNOSIS — L03115 Cellulitis of right lower limb: Secondary | ICD-10-CM | POA: Diagnosis not present

## 2022-09-21 DIAGNOSIS — L03113 Cellulitis of right upper limb: Secondary | ICD-10-CM | POA: Diagnosis not present

## 2022-09-21 DIAGNOSIS — I10 Essential (primary) hypertension: Secondary | ICD-10-CM | POA: Diagnosis not present

## 2022-09-21 NOTE — Progress Notes (Signed)
[name], [name] ([id number]) [id number] Page 1 of 6 Visit Report for 09/21/2022 Chief Complaint Document Details Patient Name: Date of Service: [name], [name] 09/21/2022 12:45 PM Medical Record Number: [id number] Patient Account Number: [id number] Date of Birth/Sex: Treating RN: May 19, 1981 (41 y.o. 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 [name] Primary Care Provider: Crissie [name] Other Clinician: Massie [name] Referring Provider: Treating Provider/Extender: [name] Weeks in Treatment: 5 Debridement Performed for Assessment: Wound #1 Right,Lateral Lower Leg Performed By: Physician [name]., PA-C Debridement Type: Debridement Level of Consciousness (Pre-procedure): Awake and Alert Pre-procedure Verification/Time Out Yes - 13:30 Taken: Start Time: 13:30 T Area Debrided (L x W): otal 2 (cm) x 1 (cm) = 2 (cm) Tissue and other material debrided: Viable, Non-Viable, Subcutaneous Level: Skin/Subcutaneous Tissue Debridement Description: Excisional Instrument: Curette Bleeding: Minimum Hemostasis Achieved: Pressure Response to Treatment: Procedure was tolerated well Level of Consciousness (Post- Awake and Alert procedure): Post Debridement  Measurements of Total Wound [name], [name] ([id number]) [id number] Page 2 of 6 Length: (cm) 2 Width: (cm) 1.9 Depth: (cm) 0.3 Volume: (cm) 0.895 Character of Wound/Ulcer Post Debridement: Stable Post Procedure Diagnosis Same as Pre-procedure Electronic Signature(s) Unsigned Entered ByMassie [name] on 09/21/2022 13:34:19 -------------------------------------------------------------------------------- Debridement Details Patient Name: Date of Service: [name], [name] 09/21/2022 12:45 PM Medical Record Number: [id number] Patient Account Number: [id number] Date of Birth/Sex: Treating RN: 1981-09-08 (41 y.o. [name], [name] Primary Care Provider: Crissie [name] Other Clinician: Massie [name] Referring Provider: Treating Provider/Extender: [name] Weeks in Treatment: 5 Debridement Performed for Assessment: Wound #5 Right,Circumferential Forearm Performed By: Physician [name]., PA-C Debridement Type: Debridement Level of Consciousness (Pre-procedure): Awake and Alert Pre-procedure Verification/Time Out Yes - 13:33 Taken: Start Time: 13:33 T Area Debrided (L x W): otal 2 (cm) x 2 (cm) = 4 (cm) Tissue and other material debrided: Viable, Non-Viable, Slough, Subcutaneous, Slough Level: Skin/Subcutaneous Tissue Debridement Description: Excisional Instrument: Curette Bleeding: Minimum Hemostasis Achieved: Pressure Response to Treatment: Procedure was tolerated well Level of Consciousness (Post- Awake and Alert procedure): Post Debridement Measurements of Total Wound Length: (cm) 9.2 Width: (cm) 8 Depth: (cm) 0.2 Volume: (cm) 11.561 Character of Wound/Ulcer Post Debridement: Stable Post Procedure Diagnosis Same as Pre-procedure Electronic Signature(s) Unsigned Entered By: [name] on 09/21/2022 13:35:17 Signature(s): [name] ([id number]) [id number] Date(s): [id number] Page 3 of  6 -------------------------------------------------------------------------------- Debridement Details Patient Name: Date of Service: [name], [name] 09/21/2022 12:45 PM Medical Record Number: [id number] Patient Account Number: [id number] Date of Birth/Sex: Treating RN: 10-Aug-1981 (41 y.o. [name] Primary Care Provider: Crissie [name] Other Clinician: Massie [name] Referring Provider: Treating Provider/Extender: [name] Weeks in Treatment: 5 Debridement Performed for Assessment: Wound #6 Left,Circumferential Forearm Performed By: Physician [name]., PA-C Debridement Type: Debridement Level of Consciousness (Pre-procedure): Awake and Alert Pre-procedure Verification/Time Out Yes - 13:35 Taken: Start Time: 13:35 T Area Debrided (L x W): otal 0.4 (cm) x 0.3 (cm) = 0.12 (cm) Tissue and other material debrided: Viable, Non-Viable, Slough, Subcutaneous, Slough Level: Skin/Subcutaneous  Tissue Debridement Description: Excisional Instrument: Curette Bleeding: Minimum Hemostasis Achieved: Pressure Response to Treatment: Procedure was tolerated well Level of Consciousness (Post- Awake and Alert procedure): Post Debridement Measurements of Total Wound Length: (cm) 0.4 Width: (cm) 0.3 Depth: (cm) 0.2 Volume: (cm) 0.019 Character of Wound/Ulcer Post Debridement: Stable Post Procedure Diagnosis Same as Pre-procedure Electronic Signature(s) Unsigned Entered ByMassie [name] on 09/21/2022 13:36:02 -------------------------------------------------------------------------------- Physician Orders Details Patient Name: Date of Service: [name], [name] 09/21/2022 12:45 PM Medical Record Number: [id number] Patient Account Number: [id number] Date of Birth/Sex: Treating RN: 23-May-1981 (41 y.o. 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 Bathing/ Shower/ Hygiene May shower; gently cleanse wound with antibacterial soap, rinse and pat dry prior to dressing wounds Edema Control - Lymphedema / Segmental Compressive Device / Other Elevate, Exercise Daily and A void Standing for Long Periods of Time. Elevate legs to the level of the heart and pump ankles as often as possible Elevate leg(s) parallel to the floor when sitting. Wound Treatment Wound #1 - Lower Leg Wound Laterality: Right, Lateral Cleanser: Byram Ancillary Kit - 15 Day Supply (Generic) 3 x Per Week/30 Days Discharge Instructions: Use supplies as instructed; Kit contains: (15) Saline Bullets; (15) 3x3 Gauze; 15 pr Gloves Cleanser: Soap and Water 3 x Per Week/30 Days Discharge Instructions: Gently cleanse wound with antibacterial soap, rinse and pat dry prior to dressing wounds Topical: Activon Honey Gel, 25 (g) Tube 3 x Per Week/30 Days Prim Dressing: Xeroform 4x4-HBD (in/in) (Generic) 3 x Per Week/30 Days ary Discharge Instructions: Apply Xeroform 4x4-HBD (in/in) as directed Secured With: Medipore T - 47M Medipore H Soft Cloth Surgical T ape ape, 2x2 (in/yd) (Generic) 3 x Per Week/30 Days Secured With:  [employer] Sterile or Non-Sterile 6-ply 4.5x4 (yd/yd) (Generic) 3 x Per Week/30 Days Discharge Instructions: Apply Kerlix as directed Secured With: [employer] Dressing, Latex-free, Size 5, Small-Head / Shoulder / Thigh 3 x Per Week/30 Days Wound #4 - Lower Leg Wound Laterality: Left, Lateral Cleanser: Byram Ancillary Kit - 15 Day Supply (Generic) 3 x Per Week/30 Days Discharge Instructions: Use supplies as instructed; Kit contains: (15) Saline Bullets; (15) 3x3 Gauze; 15 pr Gloves Cleanser: Soap and Water 3 x Per Week/30 Days Discharge Instructions: Gently cleanse wound with antibacterial soap, rinse and pat dry prior to dressing wounds Topical: Activon Honey Gel, 25 (g) Tube 3 x Per Week/30 Days Prim Dressing: Xeroform 4x4-HBD (in/in) (Generic) 3 x Per Week/30 Days ary Discharge Instructions: Apply Xeroform 4x4-HBD (in/in) as directed Secured With: Medipore T - 47M Medipore H Soft Cloth Surgical T ape ape, 2x2 (in/yd) (Generic) 3 x Per Week/30 Days Secured With: [employer] Sterile or Non-Sterile 6-ply 4.5x4 (yd/yd) (Generic) 3 x Per Week/30 Days Discharge Instructions: Apply Kerlix as directed Secured With: [employer], Latex-free, Size  5, Small-Head / Shoulder / Thigh 3 x Per Week/30 Days Wound #5 - Forearm Wound Laterality: Right, Circumferential Cleanser: [employer]ram Ancillary Kit - 15 Day Supply (Generic) 3 x Per Week/30 Days Discharge Instructions: Use supplies as instructed; Kit contains: (15) Saline Bullets; (15) 3x3 Gauze; 15 pr Gloves Cleanser: Soap and Water 3 x Per Week/30 Days Discharge Instructions: Gently cleanse wound with antibacterial soap, rinse and pat dry prior to dressing wounds Topical: Activon Honey Gel, 25 (g) Tube 3 x Per Week/30 Days Stough, [name] ([id number]) [id number] Page 5 of 6 Prim Dressing: Xeroform 4x4-HBD (in/in) (Generic) 3 x Per Week/30 Days ary Discharge Instructions: Apply Xeroform 4x4-HBD (in/in) as directed Secured  With: Medipore T - 59M Medipore H Soft Cloth Surgical T ape ape, 2x2 (in/yd) (Generic) 3 x Per Week/30 Days Secured With: [employer] Sterile or Non-Sterile 6-ply 4.5x4 (yd/yd) (Generic) 3 x Per Week/30 Days Discharge Instructions: Apply Kerlix as directed Secured With: Stretch Net Dressing, Latex-free, Size 5, Small-Head / Shoulder / Thigh 3 x Per Week/30 Days Wound #6 - Forearm Wound Laterality: Left, Circumferential Cleanser: [employer] Ancillary Kit - 15 Day Supply (Generic) 3 x Per Week/30 Days Discharge Instructions: Use supplies as instructed; Kit contains: (15) Saline Bullets; (15) 3x3 Gauze; 15 pr Gloves Cleanser: Soap and Water 3 x Per Week/30 Days Discharge Instructions: Gently cleanse wound with antibacterial soap, rinse and pat dry prior to dressing wounds Topical: Activon Honey Gel, 25 (g) Tube 3 x Per Week/30 Days Prim Dressing: Xeroform 4x4-HBD (in/in) (Generic) 3 x Per Week/30 Days ary Discharge Instructions: Apply Xeroform 4x4-HBD (in/in) as directed Secured With: Medipore T - 59M Medipore H Soft Cloth Surgical T ape ape, 2x2 (in/yd) (Generic) 3 x Per Week/30 Days Secured With: Kerlix Roll Sterile or Non-Sterile 6-ply 4.5x4 (yd/yd) (Generic) 3 x Per Week/30 Days Discharge Instructions: Apply Kerlix as directed Secured With: [employer] Dressing, Latex-free, Size 5, Small-Head / Shoulder / Thigh 3 x Per Week/30 Days Electronic Signature(s) Unsigned Entered By: [name] on 09/21/2022 13:36:31 -------------------------------------------------------------------------------- Problem List Details Patient Name: Date of Service: [name], [name] 09/21/2022 12:45 PM Medical Record Number: [id number] Patient Account Number: [id number] Date of Birth/Sex: Treating RN: 1981-02-17 (41 y.o. 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

## 2022-09-23 NOTE — Progress Notes (Signed)
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 rinse and pat dry prior to dressing wounds Peri-Wound Care Topical Activon Honey Gel, 25 (g) Tube Primary Dressing Xeroform 4x4-HBD (in/in) Discharge Instruction: Apply Xeroform 4x4-HBD (in/in) as directed Secondary Dressing Secured With [place] H Soft Cloth Surgical T ape ape, 2x2 (in/yd) Kerlix Roll Sterile or Non-Sterile 6-ply 4.5x4 (yd/yd) Discharge Instruction: Apply Kerlix as directed Stretch Net Dressing, Latex-free, Size 5, Small-Head / Shoulder / Thigh Compression Wrap Compression Stockings Add-Ons Electronic Signature(s) Signed: 09/22/2022 7:52:55 AM By: Massie Kluver Signed: 09/22/2022 5:24:20 PM By: Gretta Cool, BSN, RN, CWS, [name] RN, BSN Entered By: Massie Kluver  on 09/21/2022 13:10:48 -------------------------------------------------------------------------------- Vitals Details Patient Name: Date of Service: ADREN, DOLLINS 09/21/2022 12:45 PM Medical Record Number: [id number] Patient Account Number: [id number] Date of Birth/Sex: Treating RN: 09-Nov-1981 (41 y.o. [name], [name] Primary Care Laurier Jasperson: Crissie Figures Other Clinician: Massie Kluver Referring Jamesia Linnen: Treating Rajan Burgard/Extender: Guy Begin Weeks in Treatment: 5 Vital Signs Time Taken: 12:56 Temperature (F): 98.4 Height (in): 73 Pulse (bpm): 81 Weight (lbs): 230 Respiratory Rate (breaths/min): 16 Body Mass Index (BMI): 30.3 Blood Pressure (mmHg): 125/71 Reference Range: 80 - 120 mg / dl Electronic Signature(s) Signed: 09/22/2022 7:52:55 AM By: Massie Kluver Entered By: Massie Kluver on 09/21/2022 12:58:48

## 2022-09-24 ENCOUNTER — Ambulatory Visit: Payer: Self-pay

## 2022-09-24 DIAGNOSIS — Z0130 Encounter for examination of blood pressure without abnormal findings: Secondary | ICD-10-CM | POA: Diagnosis not present

## 2022-09-24 DIAGNOSIS — Z1389 Encounter for screening for other disorder: Secondary | ICD-10-CM | POA: Diagnosis not present

## 2022-09-24 DIAGNOSIS — F22 Delusional disorders: Secondary | ICD-10-CM | POA: Diagnosis not present

## 2022-09-27 DIAGNOSIS — F1120 Opioid dependence, uncomplicated: Secondary | ICD-10-CM | POA: Diagnosis not present

## 2022-09-28 ENCOUNTER — Encounter: Payer: Medicare PPO | Admitting: Physician Assistant

## 2022-09-28 DIAGNOSIS — L97812 Non-pressure chronic ulcer of other part of right lower leg with fat layer exposed: Secondary | ICD-10-CM | POA: Diagnosis not present

## 2022-09-28 DIAGNOSIS — L03114 Cellulitis of left upper limb: Secondary | ICD-10-CM | POA: Diagnosis not present

## 2022-09-28 DIAGNOSIS — L98492 Non-pressure chronic ulcer of skin of other sites with fat layer exposed: Secondary | ICD-10-CM | POA: Diagnosis not present

## 2022-09-28 DIAGNOSIS — Z87891 Personal history of nicotine dependence: Secondary | ICD-10-CM | POA: Diagnosis not present

## 2022-09-28 DIAGNOSIS — L0100 Impetigo, unspecified: Secondary | ICD-10-CM | POA: Diagnosis not present

## 2022-09-28 DIAGNOSIS — L97822 Non-pressure chronic ulcer of other part of left lower leg with fat layer exposed: Secondary | ICD-10-CM | POA: Diagnosis not present

## 2022-09-28 DIAGNOSIS — L03116 Cellulitis of left lower limb: Secondary | ICD-10-CM | POA: Diagnosis not present

## 2022-09-28 DIAGNOSIS — L03115 Cellulitis of right lower limb: Secondary | ICD-10-CM | POA: Diagnosis not present

## 2022-09-28 DIAGNOSIS — L03113 Cellulitis of right upper limb: Secondary | ICD-10-CM | POA: Diagnosis not present

## 2022-09-28 DIAGNOSIS — I10 Essential (primary) hypertension: Secondary | ICD-10-CM | POA: Diagnosis not present

## 2022-09-28 NOTE — Progress Notes (Signed)
ASMAR, BROZEK ([id number]) [id number] Page 1 of 8 Visit Report for 09/28/2022 Chief Complaint Document Details Patient Name: Date of Service: [name], [name] 09/28/2022 2:45 PM Medical Record Number: [id number] Patient Account Number: [id number] Date of Birth/Sex: Treating RN: May 01, 1981 (41 y.o. 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 [name] Primary Care Provider: Crissie [name] Other Clinician: Referring Provider: Treating Provider/Extender: [name] Weeks in Treatment: 6 History of Present Illness HPI Description: 08-17-2022 upon evaluation today patient has multiple wounds over the bilateral arms and lower extremities. 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 He tells me that he has had the wounds on the legs for quite some time and he has not used in that area in the past 6 months. Nonetheless he also has not been able to get him to heal based on what he tells me. He does tell me though that he continues to use and inject the fentanyl although last time was a week ago. It has been discussed with him when he was in the hospital going into rehabilitation. With that being said  he tells me the last time he did this that it really was not good and really was not helpful. Nonetheless I do believe this is still something that needs to be strongly considered and I had a really good conversation with him today in this regard. I discussed that further and the plan. In regard to the wounds themselves the patient unfortunately is having a lot of issues with necrotic eschar which is really tightly adhered in a lot of places. I do believe that he has sufficient blood flow to heal though I think this is just where the tissue honestly which just destroyed the paren from infiltrating fentanyl when he was injecting. This is good to be more difficult to heal due to the deeper damage and I discussed that with him today as well he is also had cellulitis although that looks to be resolving to me at this point. The good news is he really does not have any other major medical problems other than substance abuse. 08-24-2022 upon evaluation today patient appears to be doing well currently in regard to his wounds in fact he is actually showing signs of significant improvement a lot of the eschar as he is actually pulled off which I think have made a big difference. With that being said I think that we do need to try to debride away some of the areas today and he is in agreement with that plan. [name] ([id number]) [id number] Page 2 of 8 08-31-2022 upon evaluation today patient appears to be doing well currently in regard to his wounds he is actually making some good progress here overall. Fortunately I do not see any signs of active infection. I do think he is going require some debridement a couple of spots although a lot of the wounds were very close to complete closure which is great  news. 09-07-2022 upon evaluation today patient appears to be doing well currently in regard to his wounds. Has been tolerating the dressing changes without complication.  Everything seems to be measuring better and looking smaller. It is going require some sharp debridement pretty much across the board. 11/6; patient here for wounds on his bilateral upper and lower extremities. He has been using Medihoney with Xeroform covering. These were initial wounds from using injectable fentanyl. The patient was told at 1 point that was laced with Xylazine. In any case his wounds look fairly good. Still 1 major open area on the right lateral leg right forearm and a smaller area on the left forearm everything on the left leg seems to be just about epithelialized to me 09-21-2022 upon evaluation today patient appears to be doing well with regard to his wound is actually showing signs of excellent improvement pretty much across the board. I am very pleased with where things stand I do believe that he is headed in the right direction which is great news. No fevers, chills, nausea, vomiting, or diarrhea. 09-28-2022 upon evaluation today patient appears to be doing well in regard to his wounds most are doing better. Unfortunately he does seem to have some issues here with what appears to be a cystic acne. This is on the facial area. I think that he probably needs to see a dermatologist to see what can be done about this. The patient voiced understanding. Subsequently were going to try to get him scheduled with someone and we did give him information for a [facility] dermatology to see if they could get him in. He was thankful for this. In the meantime I will probably can put him on doxycycline to see if we can get this under control. Electronic Signature(s) Signed: 09/28/2022 3:40:00 PM By: Worthy Keeler PA-C Entered By: Worthy Keeler on 09/28/2022 15:40:00 -------------------------------------------------------------------------------- Physical Exam Details Patient Name: Date of Service: [name], [name] 09/28/2022 2:45 PM Medical Record Number: [id number] Patient Account Number:  [id number] Date of Birth/Sex: Treating RN: 1981-04-19 (41 y.o. [name] Primary Care Provider: Crissie [name] Other Clinician: Referring Provider: Treating Provider/Extender: [name] Weeks in Treatment: 6 Constitutional Well-nourished and well-hydrated in no acute distress. Respiratory normal breathing without difficulty. Psychiatric this patient is able to make decisions and demonstrates good insight into disease process. Alert and Oriented x 3. pleasant and cooperative. Notes Upon inspection patient's wound bed actually showed signs of good granulation and epithelization at this point. Fortunately there does not appear to be any signs of infection is actually doing much better and in fact other than the acne I feel like he is actually making good progress here. I do want to try him on a course of doxycycline for the next month to see if that will help out with some what appears to be more cystic acne. Electronic Signature(s) Signed: 09/28/2022 3:40:22 PM By: Worthy Keeler PA-C Entered By: Worthy Keeler on 09/28/2022 15:40:22 Sisley, [name] ([id number]) [id number] Page 3 of 8 -------------------------------------------------------------------------------- Physician Orders Details Patient Name: Date of Service: [name], [name] 09/28/2022 2:45 PM Medical Record Number: [id number] Patient Account Number: [id number] Date of Birth/Sex: Treating RN: 08/20/1981 (41 y.o. 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 Bathing/ Shower/ Hygiene May shower; gently cleanse wound with antibacterial soap, rinse and pat dry prior to dressing wounds Edema Control - Lymphedema / Segmental Compressive Device / Other Elevate, Exercise Daily and A void Standing for Long Periods of Time. Elevate legs to the level of the heart and pump ankles as often as possible Elevate leg(s) parallel to the floor when sitting. Wound Treatment Wound #1 - Lower Leg Wound Laterality: Right, Lateral Cleanser: Byram Ancillary Kit - 15 Day Supply (Generic) 3 x Per Week/30 Days Discharge Instructions: Use supplies as instructed; Kit contains: (15) Saline Bullets; (15) 3x3 Gauze; 15 pr Gloves Cleanser: Soap and Water 3 x Per Week/30 Days Discharge Instructions: Gently cleanse wound with antibacterial soap, rinse and pat dry prior to dressing wounds Topical: Activon Honey Gel, 25 (g) Tube 3 x Per Week/30 Days Prim Dressing: Xeroform 4x4-HBD (in/in) (Generic) 3 x Per Week/30 Days ary Discharge Instructions: Apply Xeroform 4x4-HBD (in/in) as directed Secured With: Medipore T - 65M Medipore H Soft Cloth Surgical T ape ape, 2x2 (in/yd) (Generic) 3 x Per Week/30 Days Secured With: [employer] Sterile or Non-Sterile 6-ply 4.5x4 (yd/yd) (Generic) 3 x Per Week/30 Days Discharge Instructions: Apply Kerlix as directed Secured With: [employer] Dressing, Latex-free, Size 5, Small-Head / Shoulder / Thigh 3 x Per Week/30 Days Wound #5 - Forearm Wound Laterality: Right, Circumferential Cleanser: Byram Ancillary Kit - 15 Day Supply (Generic) 3 x Per Week/30 Days Discharge Instructions:  Use supplies as instructed; Kit contains: (15) Saline Bullets; (15) 3x3 Gauze; 15 pr Gloves Cleanser: Soap and Water 3 x Per Week/30 Days Discharge Instructions: Gently cleanse wound with antibacterial soap, rinse and pat dry prior to dressing wounds Topical: Activon Honey Gel, 25 (g) Tube 3 x Per Week/30 Days Pollick, Eual ([id number]) [id number] Page 4 of 8 Prim Dressing: Xeroform 4x4-HBD (in/in) (Generic) 3 x Per Week/30 Days ary Discharge Instructions: Apply Xeroform 4x4-HBD (in/in) as directed Secured With: Medipore T - 65M Medipore H Soft Cloth Surgical T ape ape, 2x2 (in/yd) (Generic) 3 x Per Week/30 Days Secured With: Kerlix Roll Sterile or Non-Sterile 6-ply 4.5x4 (yd/yd) (Generic) 3 x Per Week/30 Days Discharge Instructions: Apply Kerlix as directed Secured With: Stretch Net Dressing, Latex-free, Size 5, Small-Head / Shoulder / Thigh 3 x Per Week/30 Days Patient Medications llergies: No Known Allergies A Notifications Medication Indication Start End 09/28/2022 doxycycline hyclate DOSE 1 - oral 100 mg capsule - 1 capsule oral twice a day x 30 days Electronic Signature(s) Signed: 09/28/2022 3:42:07 PM By: Worthy Keeler PA-C Entered By: Worthy Keeler on 09/28/2022 15:42:06 -------------------------------------------------------------------------------- Problem List Details Patient Name: Date of Service: [name], [name] 09/28/2022 2:45 PM Medical Record Number: [id number] Patient Account Number: [id number] Date of Birth/Sex: Treating RN: 1981-05-23 (41 y.o. 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115 Cellulitis of right lower limb 08/17/2022 No Yes L03.116 Cellulitis of left lower limb 08/17/2022 No Yes L70.8 Other acne 09/28/2022 No Yes Inactive Problems Resolved Problems Electronic Signature(s) Signed: 09/28/2022 3:39:35 PM By: Worthy Keeler PA-C Previous Signature: 09/28/2022 3:09:45 PM Version By: Worthy Keeler PA-C Entered By: Worthy Keeler on 09/28/2022 15:39:35 -------------------------------------------------------------------------------- Progress Note Details Patient Name: Date of Service: [name], [name] 09/28/2022 2:45 PM Medical Record Number: [id number] Patient Account Number: [id number] Date of Birth/Sex: Treating RN: 08/29/1981 (41 y.o. 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 He tells me that he has had the wounds on the legs for quite some time and he has not used in that area in the past 6 months. Nonetheless he also has not been able to get him to heal based on what he tells me.  He does tell me though that he continues to use and inject the fentanyl although last time was a week ago. It has been discussed with him when he was in the hospital going into rehabilitation. With that being said he tells me the last time he did this that it really was not good and really was not helpful. Nonetheless I do believe this is still something that needs to be strongly considered and I had a really good conversation with him today in this regard. I discussed that further and the plan. In regard to the wounds themselves the patient unfortunately is having a lot of issues with necrotic eschar which is really tightly adhered in a lot of places. I do believe that he has sufficient blood flow to heal though I think this is just where the tissue honestly which just destroyed the paren from infiltrating fentanyl when he was injecting. This is good to be more difficult to heal due to the deeper damage and I discussed that with him today as well he is also had cellulitis although that looks to be resolving to me at this point. The good news is he really does not have any other major medical problems other than substance abuse. 08-24-2022 upon evaluation today patient appears to be doing well currently in regard to his wounds in fact he is actually showing signs of significant improvement a lot of the eschar as he is actually pulled off which I think have made a big difference. With that being said I think that we do need to try to debride away some of the areas today and he is in agreement with that plan. 08-31-2022 upon evaluation today patient appears to be doing well currently in regard to his wounds he is actually making some good progress here overall. Fortunately I do not see any signs of active infection. I do  think he is going require some debridement a couple of spots although a lot of the wounds were very close to complete closure which is great news. 09-07-2022 upon evaluation today  patient appears to be doing well currently in regard to his wounds. Has been tolerating the dressing changes without [name], [name] ([id number]) [id number] Page 6 of 8 complication. Everything seems to be measuring better and looking smaller. It is going require some sharp debridement pretty much across the board. 11/6; patient here for wounds on his bilateral upper and lower extremities. He has been using Medihoney with Xeroform covering. These were initial wounds from using injectable fentanyl. The patient was told at 1 point that was laced with Xylazine. In any case his wounds look fairly good. Still 1 major open area on the right lateral leg right forearm and a smaller area on the left forearm everything on the left leg seems to be just about epithelialized to me 09-21-2022 upon evaluation today patient appears to be doing well with regard to his wound is actually showing signs of excellent improvement pretty much across the board. I am very pleased with where things stand I do believe that he is headed in the right direction which is great news. No fevers, chills, nausea, vomiting, or diarrhea. 09-28-2022 upon evaluation today patient appears to be doing well in regard to his wounds most are doing better. Unfortunately he does seem to have some issues here with what appears to be a cystic acne. This is on the facial area. I think that he probably needs to see a dermatologist to see what can be done about this. The patient voiced understanding. Subsequently were going to try to get him scheduled with someone and we did give him information for a [facility] dermatology to see if they could get him in. He was thankful for this. In the meantime I will probably can put him on doxycycline to see if we can get this under control. Objective Constitutional Well-nourished and well-hydrated in no acute distress. Vitals Time Taken: 3:13 PM, Height: 73 in, Weight: 230 lbs, BMI: 30.3,  Temperature: 98.4 F, Pulse: 85 bpm, Respiratory Rate: 18 breaths/min, Blood Pressure: 150/88 mmHg. Respiratory normal breathing without difficulty. Psychiatric this patient is able to make decisions and demonstrates good insight into disease process. Alert and Oriented x 3. pleasant and cooperative. General Notes: Upon inspection patient's wound bed actually showed signs of good granulation and epithelization at this point. Fortunately there does not appear to be any signs of infection is actually doing much better and in fact other than the acne I feel like he is actually making good progress here. I do want to try him on a course of doxycycline for the next month to see if that will help out with some what appears to be more cystic acne. Integumentary (Hair, Skin) Wound #1 status is Open. Original cause of wound was Other Lesion. The date acquired was: 11/09/2021. The wound has been in treatment 6 weeks. The wound is located on the Right,Lateral Lower Leg. The wound measures 2cm length x 1cm width x 0.1cm depth; 1.571cm^2 area and 0.157cm^3 volume. There is Fat Layer (Subcutaneous Tissue) exposed. There is no tunneling or undermining noted. There is a medium amount of serosanguineous drainage noted. There is no granulation within the wound bed. There is a large (67-100%) amount of necrotic tissue within the wound bed including Eschar and Adherent Slough. Wound #4 status is Open. Original cause of wound was Other Lesion. The date  acquired was: 11/09/2021. The wound has been in treatment 6 weeks. The wound is located on the Left,Lateral Lower Leg. The wound measures 0cm length x 0cm width x 0cm depth; 0cm^2 area and 0cm^3 volume. There is no tunneling or undermining noted. There is a none present amount of drainage noted. There is no granulation within the wound bed. There is no necrotic tissue within the wound bed. Wound #5 status is Open. Original cause of wound was Other Lesion. The date acquired  was: 11/09/2021. The wound has been in treatment 6 weeks. The wound is located on the Right,Circumferential Forearm. The wound measures 33cm length x 8cm width x 0.1cm depth; 207.345cm^2 area and 20.735cm^3 volume. There is no tunneling or undermining noted. There is a none present amount of drainage noted. There is no granulation within the wound bed. There is a large (67- 100%) amount of necrotic tissue within the wound bed including Eschar and Adherent Slough. Wound #6 status is Healed - Epithelialized. Original cause of wound was Other Lesion. The date acquired was: 11/09/2021. The wound has been in treatment 6 weeks. The wound is located on the Left,Circumferential Forearm. The wound measures 0cm length x 0cm width x 0cm depth; 0cm^2 area and 0cm^3 volume. There is no tunneling or undermining noted. There is a none present amount of drainage noted. There is no granulation within the wound bed. There is no necrotic tissue within the wound bed. 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 Bathing/ Shower/ Hygiene: May shower; gently cleanse wound with antibacterial soap, rinse and pat dry prior to dressing wounds Edema Control - Lymphedema / Segmental Compressive Device / Other: Elevate, Exercise Daily and Avoid Standing for Long Periods of Time. Elevate legs to the level of the heart and  pump ankles as often as possible Elevate leg(s) parallel to the floor when sitting. The following medication(s) was prescribed: doxycycline hyclate oral 100 mg capsule 1 1 capsule oral twice a day x 30 days starting 09/28/2022 WOUND #1: - Lower Leg Wound Laterality: Right, Lateral Cleanser: Byram Ancillary Kit - 15 Day Supply (Generic) 3 x Per Week/30 Days Discharge Instructions: Use supplies as instructed; Kit contains: (15) Saline Bullets; (15) 3x3 Gauze; 15 pr Gloves Cleanser: Soap and Water 3 x Per Week/30 Days Discharge Instructions: Gently cleanse wound with antibacterial soap, rinse and pat dry prior to dressing wounds Topical: Activon Honey Gel, 25 (g) Tube 3 x Per Week/30 Days Prim Dressing: Xeroform 4x4-HBD (in/in) (Generic) 3 x Per Week/30 Days ary Discharge Instructions: Apply Xeroform 4x4-HBD (in/in) as directed Secured With: Medipore T - 26M Medipore H Soft Cloth Surgical T ape ape, 2x2 (in/yd) (Generic) 3 x Per Week/30 Days Secured With: [employer] Sterile or Non-Sterile 6-ply 4.5x4 (yd/yd) (Generic) 3 x Per Week/30 Days Discharge Instructions: Apply Kerlix as directed Secured With: [employer] Dressing, Latex-free, Size 5, Small-Head / Shoulder / Thigh 3 x Per Week/30 Days WOUND #5: - Forearm Wound Laterality: Right, Circumferential Cleanser: Byram Ancillary Kit - 15 Day  Supply (Generic) 3 x Per Week/30 Days Discharge Instructions: Use supplies as instructed; Kit contains: (15) Saline Bullets; (15) 3x3 Gauze; 15 pr Gloves Cleanser: Soap and Water 3 x Per Week/30 Days Discharge Instructions: Gently cleanse wound with antibacterial soap, rinse and pat dry prior to dressing wounds Topical: Activon Honey Gel, 25 (g) Tube 3 x Per Week/30 Days Prim Dressing: Xeroform 4x4-HBD (in/in) (Generic) 3 x Per Week/30 Days ary Discharge Instructions: Apply Xeroform 4x4-HBD (in/in) as directed Secured With: Medipore T - 71M Medipore H Soft Cloth Surgical T ape ape, 2x2 (in/yd) (Generic) 3 x  Per Week/30 Days Secured With: [employer] Sterile or Non-Sterile 6-ply 4.5x4 (yd/yd) (Generic) 3 x Per Week/30 Days Discharge Instructions: Apply Kerlix as directed Secured With: [employer] Dressing, Latex-free, Size 5, Small-Head / Shoulder / Thigh 3 x Per Week/30 Days 1. I Minna recommend currently that we have the patient go ahead and continue to monitor for any signs of worsening infection. Right now I think he is doing quite well in regard to most of his wounds though he does need to keep his fingers off of EpiCord messing with them so they do not continue to breakdown. 2. I am also can recommend that we have the patient continue to utilize for any of the open wounds on his right leg and right arm the Medihoney followed by the Xeroform gauze which I think is doing a good job here. 3. I am also going to suggest patient should continue to avoid picking at the wounds I recommended that he may need to get in some gloves to help him to avoid picking them to keep this under control as if he keeps picking on his skin and continue to keep this aggravated more and more. He voiced understanding. 4. We did give him the information for [facility] dermatology in regard to the acne. He is going to give them a call as well. We will see patient back for reevaluation in 1 week here in the clinic. If anything worsens or changes patient will contact our office for additional recommendations. Electronic Signature(s) Signed: 09/28/2022 3:42:17 PM By: Worthy Keeler PA-C Entered By: Worthy Keeler on 09/28/2022 15:42:16 -------------------------------------------------------------------------------- SuperBill Details Patient Name: Date of Service: DAOUD, LOBUE 09/28/2022 Medical Record Number: [id number] Patient Account Number: [id number] Date of Birth/Sex: Treating RN: 1981/07/22 (41 y.o. 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 chronic ulcer of skin of other sites with fat layer exposed L03.113 Cellulitis of right upper limb L03.114 Cellulitis of left upper limb L03.115 Cellulitis of right lower limb L03.116 Cellulitis of left lower limb Facility Procedures : CPT4 Code: [id number] Description: 02637 - WOUND CARE VISIT-LEV 3 EST PT Modifier: Quantity: 1 Physician Procedures : CPT4 Code Description Modifier [id number] - WC PHYS LEVEL 3 - EST PT ICD-10 Diagnosis Description 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

## 2022-09-28 NOTE — Progress Notes (Addendum)
[name] ([id number]) [id number] Page 1 of 11 Visit Report for 09/28/2022 Arrival Information Details Patient Name: Date of Service: [name], [name] 09/28/2022 2:45 PM Medical Record Number: [id number] Patient Account Number: [id number] Date of Birth/Sex: Treating RN: 11/19/1980 (41 y.o. Judie Petit) Yevonne Pax Primary Care Wynston Romey: Gorden Harms Other Clinician: Referring Halli Equihua: Treating Analiah Drum/Extender: Ane Payment Weeks in Treatment: 6 Visit Information History Since Last Visit All ordered tests and consults were completed: No Patient Arrived: Ambulatory Added or deleted any medications: No Arrival Time: 14:59 Any new allergies or adverse reactions: No Accompanied By: self Had a fall or experienced change in No Transfer Assistance: None activities of daily living that may affect Patient Identification Verified: Yes risk of falls: Secondary Verification Process Completed: Yes Signs or symptoms of abuse/neglect since last visito No Patient Requires Transmission-Based Precautions: No Hospitalized since last visit: No Patient Has Alerts: No Implantable device outside of the clinic excluding No cellular tissue based products placed in the center since last visit: Has Dressing in Place as Prescribed: No Pain Present Now: No Electronic Signature(s) Signed: 10/08/2022 4:41:36 PM By: Yevonne Pax RN Entered By: Yevonne Pax on 09/28/2022 15:13:34 -------------------------------------------------------------------------------- Clinic Level of Care Assessment Details Patient Name: Date of Service: [name] 09/28/2022 2:45 PM Medical Record Number: [id number] Patient Account Number: [id number] Date of Birth/Sex: Treating RN: 1981/05/20 (41 y.o. Melonie [place] Primary Care Phoenicia Pirie: Gorden Harms Other Clinician: Referring Ercie Eliasen: Treating Troi Florendo/Extender: Ane Payment Weeks in Treatment: 6 Clinic Level of  Care Assessment Items TOOL 4 Quantity Score X- 1 0 Use when only an EandM is performed on FOLLOW-UP visit ASSESSMENTS - Nursing Assessment / Reassessment X- 1 10 Reassessment of Co-morbidities (includes updates in patient status) X- 1 5 Reassessment of Adherence to Treatment Plan [name], Riki [name] ([id number]) [id number] Page 2 of 11 ASSESSMENTS - Wound and Skin A ssessment / Reassessment []  - 0 Simple Wound Assessment / Reassessment - one wound X- 2 5 Complex Wound Assessment / Reassessment - multiple wounds []  - 0 Dermatologic / Skin Assessment (not related to wound area) ASSESSMENTS - Focused Assessment []  - 0 Circumferential Edema Measurements - multi extremities []  - 0 Nutritional Assessment / Counseling / Intervention []  - 0 Lower Extremity Assessment (monofilament, tuning fork, pulses) []  - 0 Peripheral Arterial Disease Assessment (using hand held doppler) ASSESSMENTS - Ostomy and/or Continence Assessment and Care []  - 0 Incontinence Assessment and Management []  - 0 Ostomy Care Assessment and Management (repouching, etc.) PROCESS - Coordination of Care X - Simple Patient / Family Education for ongoing care 1 15 []  - 0 Complex (extensive) Patient / Family Education for ongoing care []  - 0 Staff obtains , Records, T Results / Process Orders est []  - 0 Staff telephones HHA, Nursing Homes / Clarify orders / etc []  - 0 Routine Transfer to another Facility (non-emergent condition) []  - 0 Routine Hospital Admission (non-emergent condition) []  - 0 New Admissions / / Ordering NPWT Apligraf, etc. , []  - 0 Emergency Hospital Admission (emergent condition) X- 1 10 Simple Discharge Coordination []  - 0 Complex (extensive) Discharge Coordination PROCESS - Special Needs []  - 0 Pediatric / Minor Patient Management []  - 0 Isolation Patient Management []  - 0 Hearing / Language / Visual special needs []  -  0 Assessment of Community assistance (transportation, D/C planning, etc.) []  - 0 Additional assistance / Altered mentation []  - 0 Support Surface(s) Assessment (bed, cushion, seat, etc.) INTERVENTIONS - Wound Cleansing / Measurement []  - 0 Simple Wound  Cleansing - one wound X- 2 5 Complex Wound Cleansing - multiple wounds X- 1 5 Wound Imaging (photographs - any number of wounds) []  - 0 Wound Tracing (instead of photographs) []  - 0 Simple Wound Measurement - one wound X- 2 5 Complex Wound Measurement - multiple wounds INTERVENTIONS - Wound Dressings X - Small Wound Dressing one or multiple wounds 2 10 []  - 0 Medium Wound Dressing one or multiple wounds []  - 0 Large Wound Dressing one or multiple wounds []  - 0 Application of Medications - topical []  - 0 Application of Medications - injection INTERVENTIONS - Miscellaneous []  - 0 External ear exam [name], Riki [name] ([id number]) [id number] Page 3 of 11 []  - 0 Specimen Collection (cultures, biopsies, blood, body fluids, etc.) []  - 0 Specimen(s) / Culture(s) sent or taken to Lab for analysis []  - 0 Patient Transfer (multiple staff / Michiel SitesHoyer Lift / Similar devices) []  - 0 Simple Staple / Suture removal (25 or less) []  - 0 Complex Staple / Suture removal (26 or more) []  - 0 Hypo / Hyperglycemic Management (close monitor of Blood Glucose) []  - 0 Ankle / Brachial Index (ABI) - do not check if billed separately X- 1 5 Vital Signs Has the patient been seen at the hospital within the last three years: Yes Total Score: 100 Level Of Care: New/Established - Level 3 Electronic Signature(s) Signed: 10/08/2022 4:41:36 PM By: Yevonne PaxEpps, Carrie RN Entered By: Yevonne PaxEpps, Carrie on 09/28/2022 15:37:00 -------------------------------------------------------------------------------- Encounter Discharge Information Details Patient Name: Date of Service: [name] 09/28/2022 2:45 PM Medical Record Number:  [id number] Patient Account Number: [id number] Date of Birth/Sex: Treating RN: [birth year] (41 y.o. Melonie [place]M) Epps, Carrie Primary Care Laurinda Carreno: Gorden HarmsMcManus, Brenna Other Clinician: Referring Cori Henningsen: Treating Nasreen Goedecke/Extender: Ane PaymentStone, Hoyt McManus, Brenna Weeks in Treatment: 6 Encounter Discharge Information Items Discharge Condition: Stable Ambulatory Status: Ambulatory Discharge Destination: Home Transportation: Private Auto Accompanied By: self Schedule Follow-up Appointment: Yes Clinical Summary of Care: Electronic Signature(s) Signed: 09/28/2022 3:47:49 PM By: Yevonne PaxEpps, Carrie RN Entered By: Yevonne PaxEpps, Carrie on 09/28/2022 15:47:49 Lower Extremity Assessment Details -------------------------------------------------------------------------------- [name], [name] ([id number]) [id number] Page 4 of 11 Patient Name: Date of Service: [name] 09/28/2022 2:45 PM Medical Record Number: [id number] Patient Account Number: [id number] Date of Birth/Sex: Treating RN: [birth year] (41 y.o. Judie PetitM) Yevonne PaxEpps, Carrie Primary Care Colbi Staubs: Gorden HarmsMcManus, Brenna Other Clinician: Referring Nekeya Briski: Treating Donne Baley/Extender: Ane PaymentStone, Hoyt McManus, Brenna Weeks in Treatment: 6 Electronic Signature(s) Signed: 10/08/2022 4:41:36 PM By: Yevonne PaxEpps, Carrie RN Entered By: Yevonne PaxEpps, Carrie on 09/28/2022 15:22:27 -------------------------------------------------------------------------------- Multi Wound Chart Details Patient Name: Date of Service: [name] 09/28/2022 2:45 PM Medical Record Number: [id number] Patient Account Number: [id number] Date of Birth/Sex: Treating RN: [birth year] (41 y.o. Melonie [place]M) Epps, Carrie Primary Care Shamiya Demeritt: Gorden HarmsMcManus, Brenna Other Clinician: Referring Tekia Waterbury: Treating Jesiel Garate/Extender: Ane PaymentStone, Hoyt McManus, Brenna Weeks in Treatment: 6 Vital Signs Height(in): 73 Pulse(bpm): 85 Weight(lbs): 230 Blood Pressure(mmHg): 150/88 Body Mass Index(BMI): 30.3 Temperature(F):  98.4 Respiratory Rate(breaths/min): 18 [1:Photos:] [4:No Photos] Right, Lateral Lower Leg Left, Lateral Lower Leg Right, Circumferential Forearm Wound Location: Other Lesion Other Lesion Other Lesion Wounding Event: Atypical Atypical Atypical Primary Etiology: 11/09/2021 11/09/2021 11/09/2021 Date Acquired: 6 6 6  Weeks of Treatment: Open Open Open Wound Status: No No No Wound Recurrence: Yes Yes Yes Clustered Wound: 2x1x0.1 4.5x4.5x0.1 33x8x0.1 Measurements L x W x D (cm) 1.571 15.904 207.345 A (cm) : rea 0.157 1.59 20.735 Volume (cm) : 90.80% 54.00% 60.40% % Reduction in A rea: 95.40% 77.00% 80.20% % Reduction in Volume: Full Thickness Without Exposed Full Thickness Without  Exposed Full Thickness Without Exposed Classification: Support Structures Support Structures Support Structures Medium None Present None Present Exudate A mount: Serosanguineous N/A N/A Exudate Type: red, brown N/A N/A Exudate Color: None Present (0%) None Present (0%) None Present (0%) Granulation Amount: Large (67-100%) Large (67-100%) Large (67-100%) Necrotic Amount: Eschar, Adherent Slough N/A Eschar, Adherent Slough Necrotic Tissue: Fat Layer (Subcutaneous Tissue): Yes Fascia: No Fascia: No Exposed Structures: Fascia: No Fat Layer (Subcutaneous Tissue): No Fat Layer (Subcutaneous Tissue): No Tendon: No Tendon: No Tendon: No Muscle: No Muscle: No Muscle: No Perra, Ramie ([id number]) [id number] Page 5 of 11 Joint: No Joint: No Joint: No Bone: No Bone: No Bone: No None None None Epithelialization: Wound Number: 6 N/A N/A Photos: No Photos N/A N/A Left, Circumferential Forearm N/A N/A Wound Location: Other Lesion N/A N/A Wounding Event: Atypical N/A N/A Primary Etiology: 11/09/2021 N/A N/A Date Acquired: 6 N/A N/A Weeks of Treatment: Open N/A N/A Wound Status: No N/A N/A Wound Recurrence: Yes N/A N/A Clustered Wound: 9.5x2x0.1 N/A  N/A Measurements L x W x D (cm) 14.923 N/A N/A A (cm) : rea 1.492 N/A N/A Volume (cm) : 96.70% N/A N/A % Reduction in A rea: 98.40% N/A N/A % Reduction in Volume: Full Thickness Without Exposed N/A N/A Classification: Support Structures None Present N/A N/A Exudate A mount: N/A N/A N/A Exudate Type: N/A N/A N/A Exudate Color: None Present (0%) N/A N/A Granulation Amount: Large (67-100%) N/A N/A Necrotic Amount: Eschar, Adherent Slough N/A N/A Necrotic Tissue: Fascia: No N/A N/A Exposed Structures: Fat Layer (Subcutaneous Tissue): No Tendon: No Muscle: No Joint: No Bone: No None N/A N/A Epithelialization: Treatment Notes Electronic Signature(s) Signed: 10/08/2022 4:41:36 PM By: Yevonne Pax RN Entered By: Yevonne Pax on 09/28/2022 15:22:42 -------------------------------------------------------------------------------- Multi-Disciplinary Care Plan Details Patient Name: Date of Service: [name], [name] 09/28/2022 2:45 PM Medical Record Number: [id number] Patient Account Number: [id number] Date of Birth/Sex: Treating RN: 11/01/1981 (41 y.o. Melonie [place] Primary Care Jahmel Flannagan: Gorden Harms Other Clinician: Referring Louvenia Golomb: Treating Diangelo Radel/Extender: Ane Payment Weeks in Treatment: 6 Active Inactive Electronic Signature(s) Signed: 10/28/2022 1:08:32 PM By: Elliot Gurney, BSN, RN, CWS, [name] RN, BSN Signed: 11/06/2022 11:04:58 AM By: Yevonne Pax RN Previous Signature: 10/08/2022 4:41:36 PM Version By: Yevonne Pax RN Entered By: Elliot Gurney, BSN, RN, CWS, [name] on 10/28/2022 13:08:32 Capo, Riki [name] ([id number]) [id number] Page 6 of 11 -------------------------------------------------------------------------------- Pain Assessment Details Patient Name: Date of Service: [name], [name] 09/28/2022 2:45 PM Medical Record Number: [id number] Patient Account Number: [id number] Date of Birth/Sex: Treating RN: [birth year] (41 y.o. Judie Petit)  Yevonne Pax Primary Care Mohamedamin Nifong: Gorden Harms Other Clinician: Referring Coyle Stordahl: Treating Deontra Pereyra/Extender: Ane Payment Weeks in Treatment: 6 Active Problems Location of Pain Severity and Description of Pain Patient Has Paino No Site Locations Pain Management and Medication Current Pain Management: Electronic Signature(s) Signed: 10/08/2022 4:41:36 PM By: Yevonne Pax RN Entered By: Yevonne Pax on 09/28/2022 15:14:01 -------------------------------------------------------------------------------- Patient/Caregiver Education Details Patient Name: Date of Service: [name], [name] 11/20/2023andnbsp2:45 PM Medical Record Number: [id number] Patient Account Number: [id number] Date of Birth/Gender: Treating RN: Apr 08, 1981 (41 y.o. Melonie [place] Primary Care Physician: Gorden Harms Other Clinician: Referring Physician: Treating Physician/Extender: Ane Payment Weeks in Treatment: 6 Laverdure, Riki [name] ([id number]) [id number] Page 7 of 11 Education Assessment Education Provided To: Patient Education Topics Provided Welcome [facility]: o Methods: Explain/Verbal Responses: State content correctly Electronic Signature(s) Signed: 10/08/2022 4:41:36 PM By: Yevonne Pax RN Entered By: Yevonne Pax on 09/28/2022 15:37:33 -------------------------------------------------------------------------------- Wound Assessment Details Patient  Name: Date of Service: [name], [name] 09/28/2022 2:45 PM Medical Record Number: [id number] Patient Account Number: [id number] Date of Birth/Sex: Treating RN: 15-Aug-1981 (41 y.o. Judie Petit) Yevonne Pax Primary Care Britni Driscoll: Gorden Harms Other Clinician: Referring Karrington Mccravy: Treating Yasheka Fossett/Extender: Ane Payment Weeks in Treatment: 6 Wound Status Wound Number: 1 Primary Etiology: Atypical Wound Location: Right, Lateral Lower Leg Wound Status: Open Wounding Event:  Other Lesion Date Acquired: 11/09/2021 Weeks Of Treatment: 6 Clustered Wound: Yes Photos Wound Measurements Length: (cm) 2 Width: (cm) 1 Depth: (cm) 0.1 Area: (cm) 1.571 Volume: (cm) 0.157 % Reduction in Area: 90.8% % Reduction in Volume: 95.4% Epithelialization: None Tunneling: No Undermining: No Wound Description Classification: Full Thickness Without Exposed Support Structures Exudate Amount: Medium Exudate Type: Serosanguineous Exudate Color: red, brown Brunelli, Henley ([id number]) Foul Odor After Cleansing: No Slough/Fibrino Yes [id number] Page 8 of 11 Wound Bed Granulation Amount: None Present (0%) Exposed Structure Necrotic Amount: Large (67-100%) Fascia Exposed: No Necrotic Quality: Eschar, Adherent Slough Fat Layer (Subcutaneous Tissue) Exposed: Yes Tendon Exposed: No Muscle Exposed: No Joint Exposed: No Bone Exposed: No Electronic Signature(s) Signed: 10/08/2022 4:41:36 PM By: Yevonne Pax RN Entered By: Yevonne Pax on 09/28/2022 15:19:38 -------------------------------------------------------------------------------- Wound Assessment Details Patient Name: Date of Service: [name], [name] 09/28/2022 2:45 PM Medical Record Number: [id number] Patient Account Number: [id number] Date of Birth/Sex: Treating RN: 1980-12-26 (41 y.o. Melonie [place] Primary Care Reiley Bertagnolli: Gorden Harms Other Clinician: Referring Asahd Can: Treating Bohden Dung/Extender: Ane Payment Weeks in Treatment: 6 Wound Status Wound Number: 4 Primary Etiology: Atypical Wound Location: Left, Lateral Lower Leg Wound Status: Open Wounding Event: Other Lesion Date Acquired: 11/09/2021 Weeks Of Treatment: 6 Clustered Wound: Yes Photos Wound Measurements Length: (cm) Width: (cm) Depth: (cm) Area: (cm) Volume: (cm) 0 % Reduction in Area: 100% 0 % Reduction in Volume: 100% 0 Epithelialization: Large (67-100%) 0 Tunneling: No 0 Undermining:  No Wound Description Classification: Full Thickness Without Exposed Support Exudate Amount: None Present Structures Foul Odor After Cleansing: No Slough/Fibrino No Wound Bed Granulation Amount: None Present (0%) Exposed Structure Mccullum, Riki [name] ([id number]) [id number] Page 9 of 11 Necrotic Amount: None Present (0%) Fascia Exposed: No Fat Layer (Subcutaneous Tissue) Exposed: No Tendon Exposed: No Muscle Exposed: No Joint Exposed: No Bone Exposed: No Electronic Signature(s) Signed: 10/08/2022 4:41:36 PM By: Yevonne Pax RN Entered By: Yevonne Pax on 09/28/2022 15:35:35 -------------------------------------------------------------------------------- Wound Assessment Details Patient Name: Date of Service: [name], [name] 09/28/2022 2:45 PM Medical Record Number: [id number] Patient Account Number: [id number] Date of Birth/Sex: Treating RN: 1981-09-15 (41 y.o. Melonie [place] Primary Care Raydin Bielinski: Gorden Harms Other Clinician: Referring Korey Arroyo: Treating Natoria Archibald/Extender: Ane Payment Weeks in Treatment: 6 Wound Status Wound Number: 5 Primary Etiology: Atypical Wound Location: Right, Circumferential Forearm Wound Status: Open Wounding Event: Other Lesion Date Acquired: 11/09/2021 Weeks Of Treatment: 6 Clustered Wound: Yes Photos Wound Measurements Length: (cm) 33 Width: (cm) 8 Depth: (cm) 0.1 Area: (cm) 207.345 Volume: (cm) 20.735 % Reduction in Area: 60.4% % Reduction in Volume: 80.2% Epithelialization: None Tunneling: No Undermining: No Wound Description Classification: Full Thickness Without Exposed Suppor Exudate Amount: None Present t Structures Foul Odor After Cleansing: No Slough/Fibrino Yes Wound Bed Granulation Amount: None Present (0%) Exposed Structure Necrotic Amount: Large (67-100%) Fascia Exposed: No Necrotic Quality: Eschar, Adherent Slough Fat Layer (Subcutaneous Tissue) Exposed: No Tendon Exposed:  No Muscle Exposed: No Genco, Naithen ([id number]) [id number] Page 10 of 11 Joint Exposed: No Bone Exposed: No Electronic Signature(s) Signed: 10/08/2022 4:41:36 PM By: Yevonne Pax RN Entered By: Jettie Pagan  Carrie on 09/28/2022 15:21:43 -------------------------------------------------------------------------------- Wound Assessment Details Patient Name: Date of Service: [name], [name] 09/28/2022 2:45 PM Medical Record Number: [id number] Patient Account Number: [id number] Date of Birth/Sex: Treating RN: 02-Oct-1981 (41 y.o. Judie Petit) Yevonne Pax Primary Care Melissaann Dizdarevic: Gorden Harms Other Clinician: Referring Lavene Penagos: Treating Saachi Zale/Extender: Ane Payment Weeks in Treatment: 6 Wound Status Wound Number: 6 Primary Etiology: Atypical Wound Location: Left, Circumferential Forearm Wound Status: Healed - Epithelialized Wounding Event: Other Lesion Date Acquired: 11/09/2021 Weeks Of Treatment: 6 Clustered Wound: Yes Photos Wound Measurements Length: (cm) Width: (cm) Depth: (cm) Area: (cm) Volume: (cm) 0 % Reduction in Area: 100% 0 % Reduction in Volume: 100% 0 Epithelialization: None 0 Tunneling: No 0 Undermining: No Wound Description Classification: Full Thickness Without Exposed Support Exudate Amount: None Present Structures Foul Odor After Cleansing: No Slough/Fibrino No Wound Bed Granulation Amount: None Present (0%) Exposed Structure Necrotic Amount: None Present (0%) Fascia Exposed: No Fat Layer (Subcutaneous Tissue) Exposed: No Tendon Exposed: No Muscle Exposed: No Joint Exposed: No Bone Exposed: No Spellman, Sayf ([id number]) [id number] Page 11 of 11 Treatment Notes Wound #6 (Forearm) Wound Laterality: Left, Circumferential Cleanser Peri-Wound Care Topical Primary Dressing Secondary Dressing Secured With Compression Wrap Compression Stockings Add-Ons Electronic Signature(s) Signed:  10/08/2022 4:41:36 PM By: Yevonne Pax RN Entered By: Yevonne Pax on 09/28/2022 15:35:01 -------------------------------------------------------------------------------- Vitals Details Patient Name: Date of Service: OSVALDO, LAMPING 09/28/2022 2:45 PM Medical Record Number: [id number] Patient Account Number: [id number] Date of Birth/Sex: Treating RN: [birth year] (41 y.o. Melonie [place] Primary Care Laiken Nohr: Gorden Harms Other Clinician: Referring Jaleel Allen: Treating Zaliyah Meikle/Extender: Ane Payment Weeks in Treatment: 6 Vital Signs Time Taken: 15:13 Temperature (F): 98.4 Height (in): 73 Pulse (bpm): 85 Weight (lbs): 230 Respiratory Rate (breaths/min): 18 Body Mass Index (BMI): 30.3 Blood Pressure (mmHg): 150/88 Reference Range: 80 - 120 mg / dl Electronic Signature(s) Signed: 10/08/2022 4:41:36 PM By: Yevonne Pax RN Entered By: Yevonne Pax on 09/28/2022 15:13:51

## 2022-09-30 ENCOUNTER — Emergency Department
Admission: EM | Admit: 2022-09-30 | Discharge: 2022-09-30 | Disposition: A | Discharge status: Home / Self Care | Payer: Medicare PPO | Attending: Emergency Medicine | Admitting: Emergency Medicine

## 2022-09-30 ENCOUNTER — Other Ambulatory Visit: Payer: Self-pay

## 2022-09-30 DIAGNOSIS — L739 Follicular disorder, unspecified: Secondary | ICD-10-CM | POA: Diagnosis not present

## 2022-09-30 DIAGNOSIS — Z87891 Personal history of nicotine dependence: Secondary | ICD-10-CM | POA: Diagnosis not present

## 2022-09-30 DIAGNOSIS — I10 Essential (primary) hypertension: Secondary | ICD-10-CM | POA: Diagnosis not present

## 2022-09-30 DIAGNOSIS — L089 Local infection of the skin and subcutaneous tissue, unspecified: Secondary | ICD-10-CM

## 2022-09-30 DIAGNOSIS — L738 Other specified follicular disorders: Secondary | ICD-10-CM | POA: Diagnosis not present

## 2022-09-30 DIAGNOSIS — L989 Disorder of the skin and subcutaneous tissue, unspecified: Secondary | ICD-10-CM | POA: Diagnosis not present

## 2022-09-30 DIAGNOSIS — L03211 Cellulitis of face: Secondary | ICD-10-CM | POA: Diagnosis not present

## 2022-09-30 DIAGNOSIS — R21 Rash and other nonspecific skin eruption: Secondary | ICD-10-CM | POA: Diagnosis not present

## 2022-09-30 MED ORDER — CLINDAMYCIN HCL 300 MG PO CAPS: 300.0000 mg | ORAL_CAPSULE | Freq: Three times a day (TID) | ORAL | 0 refills | Status: AC

## 2022-09-30 NOTE — Discharge Instructions (Signed)
Call make an appointment with your primary care provider if any continued problems.  Do not pick at these areas as it makes it worse.  You may use warm moist compresses like a large wash cloth to the areas frequently. A prescription for antibiotics was sent to the pharmacy to begin taking today for the next 7 days.

## 2022-09-30 NOTE — ED Provider Notes (Signed)
[facility] Provider Note    Event Date/Time   First MD Initiated Contact with Patient 09/30/22 [phone]     (approximate)   History   Abscess   HPI  [name] is a 41 y.o. male   presents to the ED with complaint of infection on his face, neck, scalp that started 4 days ago.  Patient states that he had bumps that came up and he has been using a pair of "clean tweezers" to pick the whiteheads off of these areas.  Patient denies any itching, fever, chills.  Patient has a history of opioid abuse, adjustment disorder, drug overdose, aspiration pneumonia and respiratory failure with hypoxia.      Physical Exam   Triage Vital Signs: ED Triage Vitals  Enc Vitals Group     BP --      Pulse --      Resp --      Temp --      Temp src --      SpO2 --      Weight 09/30/22 0940 230 lb (104.3 kg)     Height 09/30/22 0940 6\' 1"  (1.854 m)     Head Circumference --      Peak Flow --      Pain Score 09/30/22 0939 7     Pain Loc --      Pain Edu? --      Excl. in [place]? --     Most recent vital signs: Vitals:   09/30/22 0945  BP: (!) 152/94  Pulse: 84  Resp: 18  Temp: 99.3 F (37.4 C)  SpO2: 95%     General: Awake, no distress.  Talkative, cooperative. CV:  Good peripheral perfusion.  Resp:  Normal effort.  Abd:  No distention.  Other:  Multiple papules at the base of the scalp posteriorly, involvement in the beard area and preauricular bilaterally with open papules without active drainage.   ED Results / Procedures / Treatments   Labs (all labs ordered are listed, but only abnormal results are displayed) Labs Reviewed - No data to display    PROCEDURES:  Critical Care performed:   Procedures   MEDICATIONS ORDERED IN ED: Medications - No data to display   IMPRESSION / MDM / ASSESSMENT AND PLAN / ED COURSE  I reviewed the triage vital signs and the nursing notes.   Differential diagnosis includes, but is not limited to, skin  abscess, pustules, folliculitis barbae, self-inflicted open wounds.  41 year old male presents to the ED with multiple areas on his scalp and beard area that initially most likely was pustules.  Patient has been using a pair of tweezers to pick at the whiteheads that have developed in these areas.  No active drainage at this time.  Patient was encouraged to discontinue picking at these areas and to use warm moist compresses by using a large washcloth frequently to the area.  Prescription for clindamycin was sent to the pharmacy for him to begin taking 3 times a day until finished.  He has to follow-up with his PCP if any continued problems.     Patient's presentation is most consistent with acute, uncomplicated illness.  FINAL CLINICAL IMPRESSION(S) / ED DIAGNOSES   Final diagnoses:  Folliculitis barbae  Skin infection     Rx / DC Orders   ED Discharge Orders          Ordered    clindamycin (CLEOCIN) 300 MG capsule  3 times daily  09/30/22 1052             Note:  This document was prepared using Dragon voice recognition software and may include unintentional dictation errors.   Tommi Rumps, PA-C 09/30/22 1110    Corena Herter, MD 09/30/22 1222

## 2022-09-30 NOTE — ED Triage Notes (Signed)
Pt to ED for several areas of swelling to face, neck and back of head that started Saturday. Pt has drt lesions, states he "took the white head off with tweezers" on most lesions. Denies itching but states has been picking at lesions. First area that pt noticed was to posterior head.

## 2022-10-04 DIAGNOSIS — F1120 Opioid dependence, uncomplicated: Secondary | ICD-10-CM | POA: Diagnosis not present

## 2022-10-05 ENCOUNTER — Ambulatory Visit: Payer: Medicare PPO | Admitting: Physician Assistant

## 2022-10-11 DIAGNOSIS — F1120 Opioid dependence, uncomplicated: Secondary | ICD-10-CM | POA: Diagnosis not present

## 2022-10-12 ENCOUNTER — Other Ambulatory Visit: Payer: Self-pay

## 2022-10-12 ENCOUNTER — Ambulatory Visit: Payer: Medicare PPO | Admitting: Physician Assistant

## 2022-10-12 ENCOUNTER — Encounter (HOSPITAL_COMMUNITY): Payer: Self-pay | Admitting: *Deleted

## 2022-10-12 ENCOUNTER — Emergency Department (HOSPITAL_COMMUNITY)
Admission: EM | Admit: 2022-10-12 | Discharge: 2022-10-12 | Discharge status: Against Medical Advice | Payer: Medicare PPO | Attending: Emergency Medicine | Admitting: Emergency Medicine

## 2022-10-12 DIAGNOSIS — Z5321 Procedure and treatment not carried out due to patient leaving prior to being seen by health care provider: Secondary | ICD-10-CM | POA: Insufficient documentation

## 2022-10-12 DIAGNOSIS — Z79891 Long term (current) use of opiate analgesic: Secondary | ICD-10-CM | POA: Insufficient documentation

## 2022-10-12 NOTE — ED Triage Notes (Signed)
Pt missed methadone clinic and was told he could come to hospital for dose.

## 2022-10-14 DIAGNOSIS — Z113 Encounter for screening for infections with a predominantly sexual mode of transmission: Secondary | ICD-10-CM | POA: Diagnosis not present

## 2022-10-14 DIAGNOSIS — F1920 Other psychoactive substance dependence, uncomplicated: Secondary | ICD-10-CM | POA: Diagnosis not present

## 2022-10-14 DIAGNOSIS — Z7289 Other problems related to lifestyle: Secondary | ICD-10-CM | POA: Diagnosis not present

## 2022-10-14 DIAGNOSIS — Z1159 Encounter for screening for other viral diseases: Secondary | ICD-10-CM | POA: Diagnosis not present

## 2022-10-14 DIAGNOSIS — L989 Disorder of the skin and subcutaneous tissue, unspecified: Secondary | ICD-10-CM | POA: Diagnosis not present

## 2022-10-18 DIAGNOSIS — F1120 Opioid dependence, uncomplicated: Secondary | ICD-10-CM | POA: Diagnosis not present

## 2022-10-25 DIAGNOSIS — F1120 Opioid dependence, uncomplicated: Secondary | ICD-10-CM | POA: Diagnosis not present

## 2022-11-01 DIAGNOSIS — F1120 Opioid dependence, uncomplicated: Secondary | ICD-10-CM | POA: Diagnosis not present

## 2022-11-08 DIAGNOSIS — F1120 Opioid dependence, uncomplicated: Secondary | ICD-10-CM | POA: Diagnosis not present

## 2022-11-15 DIAGNOSIS — F1120 Opioid dependence, uncomplicated: Secondary | ICD-10-CM | POA: Diagnosis not present

## 2022-11-22 DIAGNOSIS — F1120 Opioid dependence, uncomplicated: Secondary | ICD-10-CM | POA: Diagnosis not present

## 2022-11-23 DIAGNOSIS — B86 Scabies: Secondary | ICD-10-CM | POA: Diagnosis not present

## 2022-11-23 DIAGNOSIS — Z7952 Long term (current) use of systemic steroids: Secondary | ICD-10-CM | POA: Diagnosis not present

## 2022-11-29 DIAGNOSIS — F1120 Opioid dependence, uncomplicated: Secondary | ICD-10-CM | POA: Diagnosis not present

## 2022-12-06 DIAGNOSIS — F1120 Opioid dependence, uncomplicated: Secondary | ICD-10-CM | POA: Diagnosis not present

## 2022-12-07 DIAGNOSIS — S0100XA Unspecified open wound of scalp, initial encounter: Secondary | ICD-10-CM | POA: Diagnosis not present

## 2022-12-07 DIAGNOSIS — S0180XA Unspecified open wound of other part of head, initial encounter: Secondary | ICD-10-CM | POA: Diagnosis not present

## 2022-12-07 DIAGNOSIS — L0889 Other specified local infections of the skin and subcutaneous tissue: Secondary | ICD-10-CM | POA: Diagnosis not present

## 2022-12-07 DIAGNOSIS — S0001XA Abrasion of scalp, initial encounter: Secondary | ICD-10-CM | POA: Diagnosis not present

## 2022-12-07 DIAGNOSIS — S30811A Abrasion of abdominal wall, initial encounter: Secondary | ICD-10-CM | POA: Diagnosis not present

## 2022-12-07 DIAGNOSIS — S31109A Unspecified open wound of abdominal wall, unspecified quadrant without penetration into peritoneal cavity, initial encounter: Secondary | ICD-10-CM | POA: Diagnosis not present

## 2022-12-07 DIAGNOSIS — S0081XA Abrasion of other part of head, initial encounter: Secondary | ICD-10-CM | POA: Diagnosis not present

## 2022-12-07 DIAGNOSIS — L299 Pruritus, unspecified: Secondary | ICD-10-CM | POA: Diagnosis not present

## 2022-12-09 DIAGNOSIS — L298 Other pruritus: Secondary | ICD-10-CM | POA: Diagnosis not present

## 2022-12-09 DIAGNOSIS — R21 Rash and other nonspecific skin eruption: Secondary | ICD-10-CM | POA: Diagnosis not present

## 2022-12-14 ENCOUNTER — Emergency Department (HOSPITAL_COMMUNITY)
Admission: EM | Admit: 2022-12-14 | Discharge: 2022-12-14 | Disposition: A | Discharge status: Home / Self Care | Payer: Medicare PPO | Attending: Emergency Medicine | Admitting: Emergency Medicine

## 2022-12-14 ENCOUNTER — Other Ambulatory Visit: Payer: Self-pay

## 2022-12-14 ENCOUNTER — Encounter (HOSPITAL_COMMUNITY): Payer: Self-pay | Admitting: Emergency Medicine

## 2022-12-14 DIAGNOSIS — L299 Pruritus, unspecified: Secondary | ICD-10-CM | POA: Diagnosis present

## 2022-12-14 DIAGNOSIS — R202 Paresthesia of skin: Secondary | ICD-10-CM | POA: Diagnosis not present

## 2022-12-14 DIAGNOSIS — F424 Excoriation (skin-picking) disorder: Secondary | ICD-10-CM | POA: Diagnosis not present

## 2022-12-14 DIAGNOSIS — T07XXXA Unspecified multiple injuries, initial encounter: Secondary | ICD-10-CM

## 2022-12-14 MED ORDER — MUPIROCIN CALCIUM 2 % EX CREA: 1.0000 | TOPICAL_CREAM | Freq: Two times a day (BID) | CUTANEOUS | 0 refills | Status: DC

## 2022-12-14 MED ORDER — LORATADINE 10 MG PO TABS: 10.0000 mg | ORAL_TABLET | Freq: Every day | ORAL | 0 refills | Status: DC

## 2022-12-14 MED ORDER — LORATADINE 10 MG PO TABS
10.0000 mg | ORAL_TABLET | Freq: Once | ORAL | Status: AC
  Administered 2022-12-14: 10 mg via ORAL
  Filled 2022-12-14: qty 1

## 2022-12-14 NOTE — ED Provider Notes (Signed)
**[name] De-Identified via Obfuscation** Kirk [name]   CSN: [id number] Arrival date & time: 12/14/22  0246     History  Chief Complaint  Patient presents with   Pruritis    Kirk [name] is a 42 y.o. male.  The history is provided by the patient.  Illness Location:  Body Quality:  Itching with scabbing Severity:  Moderate Onset quality:  Gradual Duration: months. Timing:  Constant Progression:  Unchanged Chronicity:  Chronic Context:  Has been seen by [facility] and treated for scabies and dermatology who did a biopsy and did not find anything reportedly Relieved by:  Nothing Worsened by:  Nothing Ineffective treatments:  Permethrin Associated symptoms: no fever, no sore throat, no vomiting and no wheezing        Home Medications Prior to Admission medications   Medication Sig Start Date End Date Taking? Authorizing Provider  loratadine (CLARITIN) 10 MG tablet Take 1 tablet (10 mg total) by mouth daily. 12/14/22  Yes Kirk Blaney, MD  mupirocin cream (BACTROBAN) 2 % Apply 1 Application topically 2 (two) times daily. 12/14/22  Yes Kirk Dockham, MD  cloNIDine (CATAPRES) 0.1 MG tablet Take 1 tablet (0.1 mg total) by mouth 3 (three) times daily. 02/07/19   Salary, Avel Peace, MD  mirtazapine (REMERON) 7.5 MG tablet Take 1 tablet (7.5 mg total) by mouth at bedtime. 02/07/19   Salary, Avel Peace, MD  Multiple Vitamin (MULTIVITAMIN WITH MINERALS) TABS tablet Take 1 tablet by mouth daily. 02/08/19   Salary, Avel Peace, MD  QUEtiapine (SEROQUEL) 25 MG tablet Take 1 tablet (25 mg total) by mouth every 8 (eight) hours as needed (agitation). 02/07/19   Salary, Avel Peace, MD      Allergies    Patient has no known allergies.    Review of Systems   Review of Systems  Constitutional:  Negative for fever.  HENT:  Negative for sore throat.   Respiratory:  Negative for wheezing.   Gastrointestinal:  Negative for vomiting.  Skin:  Negative for color change.  All other systems  reviewed and are negative.   Physical Exam Updated Vital Signs There were no vitals taken for this visit. Physical Exam Vitals and nursing [name] reviewed.  Constitutional:      Appearance: Normal appearance.  HENT:     Head: Normocephalic and atraumatic.     Nose: Nose normal.     Mouth/Throat:     Mouth: Mucous membranes are moist.     Pharynx: Oropharynx is clear.  Eyes:     Conjunctiva/sclera: Conjunctivae normal.     Pupils: Pupils are equal, round, and reactive to light.  Cardiovascular:     Rate and Rhythm: Normal rate and regular rhythm.     Pulses: Normal pulses.     Heart sounds: Normal heart sounds.  Pulmonary:     Effort: Pulmonary effort is normal.     Breath sounds: Normal breath sounds.  Abdominal:     General: Bowel sounds are normal.     Tenderness: There is no abdominal tenderness. There is no guarding.  Musculoskeletal:        General: Normal range of motion.     Cervical back: Normal range of motion and neck supple.  Skin:    General: Skin is warm and dry.     Capillary Refill: Capillary refill takes less than 2 seconds.          Comments: Scabs with excoriations of the extremities, no warmth or erythema no fluctuance  no signs of infection.    Neurological:     General: No focal deficit present.     Mental Status: He is alert and oriented to person, place, and time.  Psychiatric:        Mood and Affect: Mood normal.        Behavior: Behavior normal.     ED Results / Procedures / Treatments   Labs (all labs ordered are listed, but only abnormal results are displayed) Labs Reviewed - No data to display  EKG None  Radiology No results found.  Procedures Procedures    Medications Ordered in ED Medications  loratadine (CLARITIN) tablet 10 mg (has no administration in time range)    ED Course/ Medical Decision Making/ A&P                             Medical Decision Making Patient with itching and formication and scabs   Amount  and/or Complexity of Data Reviewed External Data Reviewed: notes.    Details: Previous notes reviewed   Risk OTC drugs. Prescription drug management. Risk Details: I do not believe this is scabies.  This is scabs with excoriations.  I will start claritin for the sensation of formication and mupirocin to prevent infection and to encourage healing.  No systemic symptoms.  This is not vasculitis.  Stable for discharge. Strict return,      Final Clinical Impression(s) / ED Diagnoses  Return for intractable cough, coughing up blood, fevers > 100.4 unrelieved by medication, shortness of breath, intractable vomiting, chest pain, shortness of breath, weakness, numbness, changes in speech, facial asymmetry, abdominal pain, passing out, Inability to tolerate liquids or food, cough, altered mental status or any concerns. No signs of systemic illness or infection. The patient is nontoxic-appearing on exam and vital signs are within normal limits.  I have reviewed the triage vital signs and the nursing notes. Pertinent labs & imaging results that were available during my care of the patient were reviewed by me and considered in my medical decision making (see chart for details). After history, exam, and medical workup I feel the patient has been appropriately medically screened and is safe for discharge home. Pertinent diagnoses were discussed with the patient. Patient was given return precautions. Rx / DC Orders ED Discharge Orders          Ordered    loratadine (CLARITIN) 10 MG tablet  Daily        12/14/22 0313    mupirocin cream (BACTROBAN) 2 %  2 times daily        12/14/22 0313              Kirk Schlegel, MD 12/14/22 0321

## 2022-12-14 NOTE — ED Triage Notes (Signed)
Pt c/o face/head itching ongoing for a couple months. Pt states he has been seen for the same before without improvement. Pt states he thinks he has mites.

## 2022-12-20 DIAGNOSIS — F1120 Opioid dependence, uncomplicated: Secondary | ICD-10-CM | POA: Diagnosis not present

## 2022-12-23 ENCOUNTER — Telehealth: Payer: Self-pay

## 2022-12-23 NOTE — Telephone Encounter (Signed)
     Patient  visit on 2/5  at [facility]   Patient has not followed up with PCP yet and has all medications needed   Have you been able to follow up with your primary care physician? No     The patient was or was not able to obtain any needed medicine or equipment. Yes   Are there diet recommendations that you are having difficulty following? Na   Patient expresses understanding of discharge instructions and education provided has no other needs at this time.  Yes      [facility] [phone] [address], [place], [place] [address] Phone: [phone] Email: [email or web address]

## 2022-12-27 DIAGNOSIS — F1120 Opioid dependence, uncomplicated: Secondary | ICD-10-CM | POA: Diagnosis not present

## 2022-12-28 ENCOUNTER — Emergency Department
Admission: EM | Admit: 2022-12-28 | Discharge: 2022-12-28 | Disposition: A | Discharge status: Home / Self Care | Payer: Medicare PPO | Attending: Emergency Medicine | Admitting: Emergency Medicine

## 2022-12-28 DIAGNOSIS — S81802D Unspecified open wound, left lower leg, subsequent encounter: Secondary | ICD-10-CM | POA: Diagnosis not present

## 2022-12-28 DIAGNOSIS — Z5189 Encounter for other specified aftercare: Secondary | ICD-10-CM

## 2022-12-28 DIAGNOSIS — X58XXXD Exposure to other specified factors, subsequent encounter: Secondary | ICD-10-CM | POA: Insufficient documentation

## 2022-12-28 DIAGNOSIS — S81801D Unspecified open wound, right lower leg, subsequent encounter: Secondary | ICD-10-CM | POA: Insufficient documentation

## 2022-12-28 DIAGNOSIS — Z4801 Encounter for change or removal of surgical wound dressing: Secondary | ICD-10-CM | POA: Diagnosis not present

## 2022-12-28 LAB — CBC WITH DIFFERENTIAL/PLATELET
Abs Immature Granulocytes: 0.03 10*3/uL (ref 0.00–0.07)
Basophils Absolute: 0 10*3/uL (ref 0.0–0.1)
Basophils Relative: 0 %
Eosinophils Absolute: 0.1 10*3/uL (ref 0.0–0.5)
Eosinophils Relative: 1 %
HCT: 41.4 % (ref 39.0–52.0)
Hemoglobin: 13.1 g/dL (ref 13.0–17.0)
Immature Granulocytes: 0 %
Lymphocytes Relative: 29 %
Lymphs Abs: 3.3 10*3/uL (ref 0.7–4.0)
MCH: 29.6 pg (ref 26.0–34.0)
MCHC: 31.6 g/dL (ref 30.0–36.0)
MCV: 93.5 fL (ref 80.0–100.0)
Monocytes Absolute: 0.9 10*3/uL (ref 0.1–1.0)
Monocytes Relative: 8 %
Neutro Abs: 6.9 10*3/uL (ref 1.7–7.7)
Neutrophils Relative %: 62 %
Platelets: 324 10*3/uL (ref 150–400)
RBC: 4.43 MIL/uL (ref 4.22–5.81)
RDW: 13.4 % (ref 11.5–15.5)
Smear Review: NORMAL
WBC: 11.3 10*3/uL — ABNORMAL HIGH (ref 4.0–10.5)
nRBC: 0 % (ref 0.0–0.2)

## 2022-12-28 LAB — COMPREHENSIVE METABOLIC PANEL
ALT: 9 U/L (ref 0–44)
AST: 16 U/L (ref 15–41)
Albumin: 3.9 g/dL (ref 3.5–5.0)
Alkaline Phosphatase: 77 U/L (ref 38–126)
Anion gap: 8 (ref 5–15)
BUN: 9 mg/dL (ref 6–20)
CO2: 29 mmol/L (ref 22–32)
Calcium: 9.1 mg/dL (ref 8.9–10.3)
Chloride: 103 mmol/L (ref 98–111)
Creatinine, Ser: 1.05 mg/dL (ref 0.61–1.24)
GFR, Estimated: >60 mL/min (ref >60)
Glucose, Bld: 85 mg/dL (ref 70–99)
Potassium: 4.3 mmol/L (ref 3.5–5.1)
Sodium: 140 mmol/L (ref 135–145)
Total Bilirubin: 0.4 mg/dL (ref 0.3–1.2)
Total Protein: 7.8 g/dL (ref 6.5–8.1)

## 2022-12-28 MED ORDER — ACETAMINOPHEN 500 MG PO TABS
1000.0000 mg | ORAL_TABLET | Freq: Once | ORAL | Status: AC
  Administered 2022-12-28: 1000 mg via ORAL
  Filled 2022-12-28: qty 2

## 2022-12-28 NOTE — ED Provider Notes (Signed)
[facility] Provider Note    Event Date/Time   First MD Initiated Contact with Patient 12/28/22 2129     (approximate)   History   Wound Check   HPI  [name] is a 42 y.o. male presents to the emergency department for wounds to his lower legs.  States that he has been followed by wound care in the past but has not been going recently.  Worsening wounds over the past couple of days.  States that it is been worsening over the past 2 weeks.  Denies any fever or chills.  No recent falls or trauma.  No recent antibiotic use or hospitalizations.     Physical Exam   Triage Vital Signs: ED Triage Vitals  Enc Vitals Group     BP 12/28/22 1823 (!) 141/92     Pulse Rate 12/28/22 1823 87     Resp 12/28/22 1823 16     Temp 12/28/22 1823 98 F (36.7 C)     Temp Source 12/28/22 1823 Oral     SpO2 12/28/22 1823 94 %     Weight 12/28/22 1825 224 lb 13.9 oz (102 kg)     Height 12/28/22 1825 6' 1"$  (1.854 m)     Head Circumference --      Peak Flow --      Pain Score 12/28/22 1825 0     Pain Loc --      Pain Edu? --      Excl. in [place]? --     Most recent vital signs: Vitals:   12/28/22 1823  BP: (!) 141/92  Pulse: 87  Resp: 16  Temp: 98 F (36.7 C)  SpO2: 94%    Physical Exam Constitutional:      Appearance: He is well-developed.  HENT:     Head: Atraumatic.  Eyes:     Conjunctiva/sclera: Conjunctivae normal.  Cardiovascular:     Rate and Rhythm: Regular rhythm.  Pulmonary:     Effort: No respiratory distress.  Musculoskeletal:     Cervical back: Normal range of motion.  Skin:    General: Skin is warm.     Comments: Mild edema to bilateral lower extremities.  Multiple superficial wounds to bilateral lower extremities, does not probe to bone, no surrounding erythema warmth or induration.  No purulent drainage.  Neurological:     Mental Status: He is alert. Mental status is at baseline.      IMPRESSION / MDM / ASSESSMENT AND PLAN / ED  COURSE  I reviewed the triage vital signs and the nursing notes.  On chart review patient has been seen multiple times in the past as an outpatient with wound care and with dermatology.  Differential diagnosis including chronic venous stasis ulcers, chronic wounds, autoimmune disorder  Labs (all labs ordered are listed, but only abnormal results are displayed) Labs interpreted as -    Labs Reviewed  CBC WITH DIFFERENTIAL/PLATELET - Abnormal; Notable for the following components:      Result Value   WBC 11.3 (*)    All other components within normal limits  COMPREHENSIVE METABOLIC PANEL      Mild leukocytosis.  No other significant electrolyte abnormalities.  On chart review patient has chronically elevated white count in the past.  Clinical picture is most consistent with chronic venous stasis ulcers.  Does not appear acutely infected at this time and do not feel that the patient needs antibiotics.  Discussed wet-to-dry dressings and following up in the  wound care clinic.  Patient has an appointment scheduled with wound care March 7.  Given information to establish care with primary care provider.  Given return precautions for any worsening symptoms.   PROCEDURES:  Critical Care performed: No  Procedures  Patient's presentation is most consistent with acute presentation with potential threat to life or bodily function.   MEDICATIONS ORDERED IN ED: Medications  acetaminophen (TYLENOL) tablet 1,000 mg (1,000 mg Oral Given 12/28/22 2159)    FINAL CLINICAL IMPRESSION(S) / ED DIAGNOSES   Final diagnoses:  Encounter for wound re-check     Rx / DC Orders   ED Discharge Orders     None        Note:  This document was prepared using Dragon voice recognition software and may include unintentional dictation errors.   Nathaniel Man, MD 12/28/22 [phone]

## 2022-12-28 NOTE — ED Triage Notes (Signed)
Pt presents to the ED via POV due to bilateral leg wounds. Pt states he was being followed by wound care but he stop attending. Pt states  they have been getting worse over the past 2 weeks. Pt appears to have multiple healing wounds on his arms and legs. Pt denies NVD. Pt A&Ox4

## 2022-12-28 NOTE — Discharge Instructions (Signed)
You are seen in the emergency department for wounds to your lower legs.  Concern that they are venous stasis ulcers.  Use wet-to-dry dressings as you have in the past with wound care clinic.  Follow-up closely with a primary care physician.  Return to the emergency department for any signs of infection or fever.

## 2022-12-31 DIAGNOSIS — S81809D Unspecified open wound, unspecified lower leg, subsequent encounter: Secondary | ICD-10-CM | POA: Diagnosis not present

## 2022-12-31 DIAGNOSIS — S80819D Abrasion, unspecified lower leg, subsequent encounter: Secondary | ICD-10-CM | POA: Diagnosis not present

## 2022-12-31 DIAGNOSIS — X58XXXD Exposure to other specified factors, subsequent encounter: Secondary | ICD-10-CM | POA: Diagnosis not present

## 2023-01-03 DIAGNOSIS — F1120 Opioid dependence, uncomplicated: Secondary | ICD-10-CM | POA: Diagnosis not present

## 2023-01-04 ENCOUNTER — Telehealth: Payer: Self-pay

## 2023-01-04 NOTE — Telephone Encounter (Signed)
     Patient  visit on 2/19  at [facility]   Have you been able to follow up with your primary care physician? Yes   The patient was or was not able to obtain any needed medicine or equipment. Yes   Are there diet recommendations that you are having difficulty following? Na   Patient expresses understanding of discharge instructions and education provided has no other needs at this time.  Yes     [facility] [phone] [address], [place], [place] [address] Phone: [phone] Email: [email or web address]

## 2023-01-10 DIAGNOSIS — F1120 Opioid dependence, uncomplicated: Secondary | ICD-10-CM | POA: Diagnosis not present

## 2023-01-14 ENCOUNTER — Ambulatory Visit: Payer: Medicare PPO | Admitting: Internal Medicine

## 2023-01-17 DIAGNOSIS — F1120 Opioid dependence, uncomplicated: Secondary | ICD-10-CM | POA: Diagnosis not present

## 2023-01-24 DIAGNOSIS — F1120 Opioid dependence, uncomplicated: Secondary | ICD-10-CM | POA: Diagnosis not present

## 2023-01-29 ENCOUNTER — Encounter: Payer: Medicare PPO | Attending: Internal Medicine | Admitting: Physician Assistant

## 2023-01-29 DIAGNOSIS — L089 Local infection of the skin and subcutaneous tissue, unspecified: Secondary | ICD-10-CM | POA: Diagnosis not present

## 2023-01-29 DIAGNOSIS — I10 Essential (primary) hypertension: Secondary | ICD-10-CM | POA: Diagnosis not present

## 2023-01-29 DIAGNOSIS — L03116 Cellulitis of left lower limb: Secondary | ICD-10-CM | POA: Diagnosis not present

## 2023-01-29 DIAGNOSIS — Z87891 Personal history of nicotine dependence: Secondary | ICD-10-CM | POA: Diagnosis not present

## 2023-01-29 DIAGNOSIS — R21 Rash and other nonspecific skin eruption: Secondary | ICD-10-CM | POA: Diagnosis not present

## 2023-01-29 DIAGNOSIS — L97822 Non-pressure chronic ulcer of other part of left lower leg with fat layer exposed: Secondary | ICD-10-CM | POA: Insufficient documentation

## 2023-01-29 DIAGNOSIS — L739 Follicular disorder, unspecified: Secondary | ICD-10-CM | POA: Diagnosis not present

## 2023-01-29 DIAGNOSIS — L708 Other acne: Secondary | ICD-10-CM | POA: Insufficient documentation

## 2023-01-29 DIAGNOSIS — F19959 Other psychoactive substance use, unspecified with psychoactive substance-induced psychotic disorder, unspecified: Secondary | ICD-10-CM | POA: Diagnosis not present

## 2023-01-29 DIAGNOSIS — L97812 Non-pressure chronic ulcer of other part of right lower leg with fat layer exposed: Secondary | ICD-10-CM | POA: Insufficient documentation

## 2023-01-29 DIAGNOSIS — L03115 Cellulitis of right lower limb: Secondary | ICD-10-CM | POA: Diagnosis not present

## 2023-01-29 DIAGNOSIS — M79621 Pain in right upper arm: Secondary | ICD-10-CM | POA: Diagnosis not present

## 2023-01-30 NOTE — Progress Notes (Signed)
KEONNE, COPPING ([id number]) [id number] Page 1 of 8 Visit Report for 01/29/2023 Allergy List Details Patient Name: Date of Service: [name], JAUCH 01/29/2023 10:00 [place] Record Number: [id number] Patient Account Number: [id number] Date of Birth/Sex: Treating RN: 1980/11/28 (42 y.o. Clide Dales Primary Care Quanah Majka: SYSTEM, PCP Other Clinician: Referring Darlene Bartelt: Treating Heraclio Seidman/Extender: Jeri Cos Self, Referral Weeks in Treatment: 0 Allergies Active Allergies No Known Allergies Allergy Notes Electronic Signature(s) Signed: 01/29/2023 1:48:47 PM By: Levora Dredge Entered By: Levora Dredge on 01/29/2023 10:08:18 -------------------------------------------------------------------------------- Arrival Information Details Patient Name: Date of Service: MAXIMUS, [name] 01/29/2023 10:00 [place] Number: [id number] Patient Account Number: [id number] Date of Birth/Sex: Treating RN: [birth year] (42 y.o. Clide Dales Primary Care Tandra Rosado: SYSTEM, PCP Other Clinician: Referring Khamari Sheehan: Treating Silvina Hackleman/Extender: Jeri Cos Self, Referral Weeks in Treatment: 0 Visit Information Patient Arrived: Ambulatory Arrival Time: 10:04 Accompanied By: self Transfer Assistance: None Patient Identification Verified: Yes Secondary Verification Process Completed: Yes History Since Last Visit Added or deleted any medications: No Any new allergies or adverse reactions: No Had a fall or experienced change in activities of daily living that may affect risk of falls: No Hospitalized since last visit: No Has Dressing in Place as Prescribed: Yes Pain Present Now: Yes Electronic Signature(s) Signed: 01/29/2023 1:48:47 PM By: Levora Dredge Entered By: Levora Dredge on 01/29/2023 10:07:05 -------------------------------------------------------------------------------- Clinic Level of Care Assessment Details Patient Name: Date of  Service: TOMMI, PEERSON 01/29/2023 10:00 [place] Record Number: [id number] Patient Account Number: [id number] Date of Birth/Sex: Treating RN: Aug 11, 1981 (42 y.o. Clide Dales Primary Care Briann Sarchet: SYSTEM, PCP Other Clinician: Referring Charon Smedberg: Treating Lamonda Noxon/Extender: Jeri Cos Self, Referral Weeks in Treatment: 0 Clinic Level of Care Assessment Items TOOL 1 Quantity Score []  - 0 Use when EandM and Procedure is performed on INITIAL visit ASSESSMENTS - Nursing Assessment / Reassessment X- 1 20 General Physical Exam (combine w/ comprehensive assessment (listed just below) when performed on new pt. evals) X- 1 25 Comprehensive Assessment (HX, ROS, Risk Assessments, Wounds Hx, etc.) Vazguez, Malaky ([id number]) [id number] Page 2 of 8 ASSESSMENTS - Wound and Skin Assessment / Reassessment []  - 0 Dermatologic / Skin Assessment (not related to wound area) ASSESSMENTS - Ostomy and/or Continence Assessment and Care []  - 0 Incontinence Assessment and Management []  - 0 Ostomy Care Assessment and Management (repouching, etc.) PROCESS - Coordination of Care X - Simple Patient / Family Education for ongoing care 1 15 []  - 0 Complex (extensive) Patient / Family Education for ongoing care X- 1 10 Staff obtains Programmer, systems, Records, T Results / Process Orders est []  - 0 Staff telephones HHA, Nursing Homes / Clarify orders / etc []  - 0 Routine Transfer to another Facility (non-emergent condition) []  - 0 Routine Hospital Admission (non-emergent condition) X- 1 15 New Admissions / Biomedical engineer / Ordering NPWT Apligraf, etc. , []  - 0 Emergency Hospital Admission (emergent condition) PROCESS - Special Needs []  - 0 Pediatric / Minor Patient Management []  - 0 Isolation Patient Management []  - 0 Hearing / Language / Visual special needs []  - 0 Assessment of Community assistance (transportation, D/C planning, etc.) []  - 0 Additional  assistance / Altered mentation []  - 0 Support Surface(s) Assessment (bed, cushion, seat, etc.) INTERVENTIONS - Miscellaneous []  - 0 External ear exam []  - 0 Patient Transfer (multiple staff / Civil Service fast streamer / Similar devices) []  - 0 Simple Staple / Suture removal (25 or less) []  - 0 Complex Staple / Suture removal (26 or more) []  -  0 Hypo/Hyperglycemic Management (do not check if billed separately) X- 1 15 Ankle / Brachial Index (ABI) - do not check if billed separately Has the patient been seen at the hospital within the last three years: Yes Total Score: 100 Level Of Care: New/Established - Level 3 Electronic Signature(s) Signed: 01/29/2023 1:48:47 PM By: Levora Dredge Entered By: Levora Dredge on 01/29/2023 11:23:23 -------------------------------------------------------------------------------- Encounter Discharge Information Details Patient Name: Date of Service: SHREYAAN, GRZYBOWSKI 01/29/2023 10:00 [place] Record Number: [id number] Patient Account Number: [id number] Date of Birth/Sex: Treating RN: 03-13-1981 (42 y.o. Clide Dales Primary Care Crystelle Ferrufino: SYSTEM, PCP Other Clinician: Referring Dawan Farney: Treating Deira Shimer/Extender: Jeri Cos Self, Referral Weeks in Treatment: 0 Encounter Discharge Information Items Post Procedure Vitals Discharge Condition: Stable Temperature (F): 97.8 Ambulatory Status: Ambulatory Pulse (bpm): 60 Discharge Destination: Home Respiratory Rate (breaths/min): 18 Transportation: Private Auto Blood Pressure (mmHg): 112/69 Accompanied By: self Schedule Follow-up Appointment: Yes Clinical Summary of Care: RECCO, SCIACCA ([id number]) [id number] Page 3 of 8 Electronic Signature(s) Signed: 01/29/2023 1:48:47 PM By: Levora Dredge Entered By: Levora Dredge on 01/29/2023 11:25:37 -------------------------------------------------------------------------------- Lower Extremity Assessment Details Patient Name: Date  of Service: JARVON, SCHOPP 01/29/2023 10:00 [place] Record Number: [id number] Patient Account Number: [id number] Date of Birth/Sex: Treating RN: 1981-02-01 (42 y.o. Clide Dales Primary Care Kairy Folsom: SYSTEM, PCP Other Clinician: Referring Rickesha Veracruz: Treating Hannie Shoe/Extender: Jeri Cos Self, Referral Weeks in Treatment: 0 Edema Assessment Assessed: [Left: No] [Right: No] Edema: [Left: Yes] [Right: Yes] Calf Left: Right: Point of Measurement: 36 cm From Medial Instep 38.9 cm 38.6 cm Ankle Left: Right: Point of Measurement: 11 cm From Medial Instep 26.2 cm 26.8 cm Vascular Assessment Pulses: Dorsalis Pedis Palpable: [Left:Yes] [Right:Yes] Posterior Tibial Palpable: [Left:Yes] [Right:Yes] Blood Pressure: Brachial: [id number] [Right:104] Ankle: [Left:Dorsalis Pedis: 146 1.40] [Right:Dorsalis Pedis: 144 1.38] Electronic Signature(s) Signed: 01/29/2023 1:48:47 PM By: Levora Dredge Entered By: Levora Dredge on 01/29/2023 10:25:48 -------------------------------------------------------------------------------- Multi Wound Chart Details Patient Name: Date of Service: JERICO, CRUSE 01/29/2023 10:00 [place] Record Number: [id number] Patient Account Number: [id number] Date of Birth/Sex: Treating RN: March 03, 1981 (42 y.o. Clide Dales Primary Care Neville Walston: SYSTEM, PCP Other Clinician: Referring Koston Hennes: Treating Rachid Parham/Extender: Jeri Cos Self, Referral Weeks in Treatment: 0 Vital Signs Height(in): 73 Pulse(bpm): 60 Weight(lbs): 220 Blood Pressure(mmHg): 112/69 Body Mass Index(BMI): 29 Temperature(F): 97.8 Respiratory Rate(breaths/min): 18 [7:Photos:] [N/A:N/A [id number] Page 4 of 8] Left, Lateral Lower Leg Right, Medial Lower Leg N/A Wound Location: Gradually Appeared Gradually Appeared N/A Wounding Event: T be determined o T be determined o N/A Primary Etiology: 11/23/2022 11/23/2022 N/A Date Acquired: 0 0  N/A Weeks of Treatment: Open Open N/A Wound Status: No No N/A Wound Recurrence: Yes No N/A Clustered Wound: 4 N/A N/A Clustered Quantity: 7.2x6.5x0.1 2.5x1.7x0.1 N/A Measurements L x W x D (cm) 36.757 3.338 N/A A (cm) : rea 3.676 0.334 N/A Volume (cm) : Full Thickness Without Exposed Full Thickness Without Exposed N/A Classification: Support Structures Support Structures Medium Medium N/A Exudate A mount: Serosanguineous Serosanguineous N/A Exudate Type: red, brown red, brown N/A Exudate Color: Large (67-100%) Large (67-100%) N/A Granulation A mount: Red, Hyper-granulation Red N/A Granulation Quality: Small (1-33%) Small (1-33%) N/A Necrotic A mount: Fat Layer (Subcutaneous Tissue): Yes Fat Layer (Subcutaneous Tissue): Yes N/A Exposed Structures: None None N/A Epithelialization: Treatment Notes Electronic Signature(s) Signed: 01/29/2023 1:48:47 PM By: Levora Dredge Entered By: Levora Dredge on 01/29/2023 11:10:45 -------------------------------------------------------------------------------- [place] Details Patient Name: Date of Service: KEIGAN, SALDARRIAGA 01/29/2023 10:00 [place] Record Number: [id number]  Patient Account Number: [id number] Date of Birth/Sex: Treating RN: [birth year] (42 y.o. Clide Dales Primary Care Shania Bjelland: SYSTEM, PCP Other Clinician: Referring Dionna Wiedemann: Treating Larah Kuntzman/Extender: Jeri Cos Self, Referral Weeks in Treatment: 0 Active Inactive Orientation to the Wound Care Program Nursing Diagnoses: Knowledge deficit related to the wound healing center program Goals: Patient/caregiver will verbalize understanding of the [place] Program Date Initiated: 01/29/2023 Target Resolution Date: 02/04/2023 Goal Status: Active Interventions: Provide education on orientation to the wound center Notes: Wound/Skin Impairment Nursing Diagnoses: Impaired tissue integrity Knowledge deficit related to  ulceration/compromised skin integrity Goals: Ulcer/skin breakdown will have a volume reduction of 30% by week 4 Date Initiated: 01/29/2023 Target Resolution Date: 02/26/2023 Goal Status: Active Ulcer/skin breakdown will have a volume reduction of 50% by week 8 Date Initiated: 01/29/2023 Target Resolution Date: 03/26/2023 ANTHANY, POLIT ([id number]) [id number] Page 5 of 8 Goal Status: Active Ulcer/skin breakdown will have a volume reduction of 80% by week 12 Date Initiated: 01/29/2023 Target Resolution Date: 04/23/2023 Goal Status: Active Ulcer/skin breakdown will heal within 14 weeks Date Initiated: 01/29/2023 Target Resolution Date: 05/07/2023 Goal Status: Active Interventions: Assess patient/caregiver ability to obtain necessary supplies Assess patient/caregiver ability to perform ulcer/skin care regimen upon admission and as needed Assess ulceration(s) every visit Provide education on ulcer and skin care Treatment Activities: Skin care regimen initiated : 01/29/2023 Notes: Electronic Signature(s) Signed: 01/29/2023 1:48:47 PM By: Levora Dredge Entered By: Levora Dredge on 01/29/2023 11:24:32 -------------------------------------------------------------------------------- Pain Assessment Details Patient Name: Date of Service: STEPAN, BRODERSON 01/29/2023 10:00 [place] Record Number: [id number] Patient Account Number: [id number] Date of Birth/Sex: Treating RN: [birth year] (42 y.o. Clide Dales Primary Care Sevanna Ballengee: SYSTEM, PCP Other Clinician: Referring Kymberlie Brazeau: Treating Lucinda Spells/Extender: Jeri Cos Self, Referral Weeks in Treatment: 0 Active Problems Location of Pain Severity and Description of Pain Patient Has Paino Yes Site Locations Rate the pain. Current Pain Level: 9 Pain Management and Medication Current Pain Management: Notes pt states pain in head 9/10, pt states went to ED yesterday for headache and states was prescribed  doxy Electronic Signature(s) Signed: 01/29/2023 1:48:47 PM By: Levora Dredge Entered By: Levora Dredge on 01/29/2023 10:07:36 Patient/Caregiver Education Details -------------------------------------------------------------------------------- [name] ([id number]) [id number] Page 6 of 8 Patient Name: Date of Service: [name], [name] 3/22/2024andnbsp10:00 [facility] Record Number: [id number] Patient Account Number: [id number] Date of Birth/Gender: Treating RN: October 03, 1981 (42 y.o. Clide Dales Primary Care Physician: SYSTEM, PCP Other Clinician: Referring Physician: Treating Physician/Extender: Jeri Cos Self, Referral Weeks in Treatment: 0 Education Assessment Education Provided To: Patient Education Topics Provided Welcome T [facility]-New Patient Packet: o Handouts: The Wound Healing Pledge form, Welcome T [facility] [place] o Methods: Explain/Verbal Responses: State content correctly Wound Debridement: Handouts: Wound Debridement Methods: Explain/Verbal Responses: State content correctly Wound/Skin Impairment: Handouts: Caring for Your Ulcer Methods: Explain/Verbal Responses: State content correctly Electronic Signature(s) Signed: 01/29/2023 1:48:47 PM By: Levora Dredge Entered By: Levora Dredge on 01/29/2023 11:24:50 -------------------------------------------------------------------------------- Wound Assessment Details Patient Name: Date of Service: [name], [name] 01/29/2023 10:00 [place] Record Number: [id number] Patient Account Number: [id number] Date of Birth/Sex: Treating RN: [birth year] (41 y.o. Clide Dales Primary Care Fransisca Shawn: SYSTEM, PCP Other Clinician: Referring Shanice Poznanski: Treating Phyllistine Domingos/Extender: Jeri Cos Self, Referral Weeks in Treatment: 0 Wound Status Wound Number: 7 Primary Etiology: T be determined o Wound Location: Left, Lateral Lower Leg Wound Status: Open Wounding  Event: Gradually Appeared Date Acquired: 11/23/2022 Weeks Of Treatment: 0 Clustered Wound: Yes Photos Wound Measurements Length: (cm) 7.2 Width: (  cm) 6.5 Depth: (cm) 0.1 Clustered Quantity: 4 Shutters, Vern ([id number]) Area: (cm) 36.757 Volume: (cm) 3.676 % Reduction in Area: % Reduction in Volume: Epithelialization: None Tunneling: No [id number] Page 7 of 8 Undermining: No Wound Description Classification: Full Thickness Without Exposed Suppor Exudate Amount: Medium Exudate Type: Serosanguineous Exudate Color: red, brown t Structures Foul Odor After Cleansing: No Slough/Fibrino Yes Wound Bed Granulation Amount: Large (67-100%) Exposed Structure Granulation Quality: Red, Hyper-granulation Fat Layer (Subcutaneous Tissue) Exposed: Yes Necrotic Amount: Small (1-33%) Necrotic Quality: Adherent Therapist, music) Signed: 01/29/2023 1:48:47 PM By: Levora Dredge Entered By: Levora Dredge on 01/29/2023 10:36:10 -------------------------------------------------------------------------------- Wound Assessment Details Patient Name: Date of Service: [name], [name] 01/29/2023 10:00 [place] Record Number: [id number] Patient Account Number: [id number] Date of Birth/Sex: Treating RN: 02/09/1981 (42 y.o. Clide Dales Primary Care Pasqual Farias: SYSTEM, PCP Other Clinician: Referring Davonna Ertl: Treating Eugina Row/Extender: Jeri Cos Self, Referral Weeks in Treatment: 0 Wound Status Wound Number: 8 Primary Etiology: T be determined o Wound Location: Right, Medial Lower Leg Wound Status: Open Wounding Event: Gradually Appeared Date Acquired: 11/23/2022 Weeks Of Treatment: 0 Clustered Wound: No Photos Wound Measurements Length: (cm) 2.5 Width: (cm) 1.7 Depth: (cm) 0.1 Area: (cm) 3.338 Volume: (cm) 0.334 % Reduction in Area: % Reduction in Volume: Epithelialization: None Tunneling: No Undermining: No Wound  Description Classification: Full Thickness Without Exposed Suppor Exudate Amount: Medium Exudate Type: Serosanguineous Exudate Color: red, brown t Structures Foul Odor After Cleansing: No Slough/Fibrino Yes Wound Bed Granulation Amount: Large (67-100%) Exposed Structure Granulation Quality: Red Fat Layer (Subcutaneous Tissue) Exposed: Yes Necrotic Amount: Small (1-33%) Necrotic Quality: [address] [place], [place] ([id number]) [phone].pdf Page 8 of 8 Electronic Signature(s) Signed: 01/29/2023 1:48:47 PM By: Levora Dredge Entered By: Levora Dredge on 01/29/2023 10:37:26 -------------------------------------------------------------------------------- Vitals Details Patient Name: Date of Service: [name], [name] 01/29/2023 10:00 [place] Record Number: [id number] Patient Account Number: [id number] Date of Birth/Sex: Treating RN: 23-Aug-1981 (42 y.o. Clide Dales Primary Care Bray Vickerman: SYSTEM, PCP Other Clinician: Referring Senai Ramnath: Treating Doyle Tegethoff/Extender: Jeri Cos Self, Referral Weeks in Treatment: 0 Vital Signs Time Taken: 10:05 Temperature (F): 97.8 Height (in): 73 Pulse (bpm): 60 Source: Stated Respiratory Rate (breaths/min): 18 Weight (lbs): 220 Blood Pressure (mmHg): 112/69 Source: Stated Reference Range: 80 - 120 mg / dl Body Mass Index (BMI): 29 Electronic Signature(s) Signed: 01/29/2023 1:48:47 PM By: Levora Dredge Entered By: Levora Dredge on 01/29/2023 10:07:58

## 2023-01-30 NOTE — Progress Notes (Signed)
[name], [name] ([id number]) [id number] Page 1 of 9 Visit Report for 01/29/2023 Chief Complaint Document Details Patient Name: Date of Service: [name], [name] 01/29/2023 10:00 [place] Record [name]: [id number] Patient Account [name]: [id number] Date of Birth/Sex: Treating RN: [birth year] (42 y.o. 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 [name] Primary Care Provider: SYSTEM, PCP Other Clinician: Referring Provider: Treating Provider/Extender: [name], Referral Weeks in Treatment: 0 Debridement Performed for Assessment: Wound #7 Left,Lateral Lower Leg Performed By: Physician Kirk Sams., PA-C Debridement Type: Debridement Level of Consciousness (Pre-procedure): Awake and Alert Pre-procedure Verification/Time Out Yes - 11:12 Taken: Pain Control: Lidocaine 4% T opical Solution T Area Debrided (L x W): otal 1 (cm) x 1 (cm) = 1 (cm) Tissue and other material debrided: Viable, Non-Viable, Eschar, Slough, Subcutaneous, Slough Level: Skin/Subcutaneous Tissue Debridement Description: Excisional Instrument: Curette Bleeding: Minimum Hemostasis Achieved: Pressure Response to Treatment: Procedure was tolerated well Level of Consciousness (Post- Awake and Alert procedure): Post  Debridement Measurements of Total Wound Length: (cm) 7.2 Width: (cm) 6.5 Depth: (cm) 0.1 Volume: (cm) 3.676 Character of Wound/Ulcer Post Debridement: Stable Post Procedure Diagnosis Same as Pre-procedure Electronic Signature(s) Signed: 01/29/2023 1:48:47 PM By: Levora Dredge Signed: 01/29/2023 2:10:36 PM By: Worthy Keeler PA-C Entered By: Levora Dredge on 01/29/2023 11:13:19 HPI Details -------------------------------------------------------------------------------- [name] ([id number]) [id number] Page 2 of 9 Patient Name: Date of Service: [name], [name] 01/29/2023 10:00 [place] Record [name]: [id number] Patient Account [name]: [id number] Date of Birth/Sex: Treating RN: [birth year] (42 y.o. [name] Primary Care Provider: SYSTEM, PCP Other Clinician: Referring Provider: Treating Provider/Extender: [name], Referral Weeks in Treatment: 0 History of Present Illness HPI Description: 08-17-2022 upon evaluation today patient has multiple wounds over the bilateral arms and lower extremities. 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 He tells me that he has had the wounds on the legs for quite some time and he has not used in that area in the past 6 months. Nonetheless he also has not been able to get him to heal based on what he tells me. He does tell me though that he continues to use and inject the fentanyl although last time was Kirk week ago. It has been discussed with him when he was in the hospital going into rehabilitation. With that being said he tells me the last time he did this that it really was not good and really was not helpful. Nonetheless I do believe this is still something that needs to be strongly considered and I had Kirk really good conversation with him today in this regard. I discussed that further and the plan. In regard to the wounds themselves the patient unfortunately is having Kirk lot  of issues with necrotic eschar which is really tightly adhered in Kirk lot of places. I do believe that he has sufficient blood flow to heal though I think this is just where the tissue honestly which just destroyed the paren from infiltrating fentanyl when he was injecting. This is  good to be more difficult to heal due to the deeper damage and I discussed that with him today as well he is also had cellulitis although that looks to be resolving to me at this point. The good news is he really does not have any other major medical problems other than substance abuse. 08-24-2022 upon evaluation today patient appears to be doing well currently in regard to his wounds in fact he is actually showing signs of significant improvement Kirk lot of the eschar as he is actually pulled off which I think have made Kirk big difference. With that being said I think that we do need to try to debride away some of the areas today and he is in agreement with that plan. 08-31-2022 upon evaluation today patient appears to be doing well currently in regard to his wounds he is actually making some good progress here overall. Fortunately I do not see any signs of active infection. I do think he is going require some debridement Kirk couple of spots although Kirk lot of the wounds were very close to complete closure which is great news. 09-07-2022 upon evaluation today patient appears to be doing well currently in regard to his wounds. Has been tolerating the dressing changes without complication. Everything seems to be measuring better and looking smaller. It is going require some sharp debridement pretty much across the board. 11/6; patient here for wounds on his bilateral upper and lower extremities. He has been using Medihoney with Xeroform covering. These were initial wounds from using injectable fentanyl. The patient was told at 1 point that was laced with Xylazine. In any case his wounds look fairly good. Still 1 major open area on  the right lateral leg right forearm and Kirk smaller area on the left forearm everything on the left leg seems to be just about epithelialized to me 09-21-2022 upon evaluation today patient appears to be doing well with regard to his wound is actually showing signs of excellent improvement pretty much across the board. I am very pleased with where things stand I do believe that he is headed in the right direction which is great news. No fevers, chills, nausea, vomiting, or diarrhea. 09-28-2022 upon evaluation today patient appears to be doing well in regard to his wounds most are doing better. Unfortunately he does seem to have some issues here with what appears to be Kirk cystic acne. This is on the facial area. I think that he probably needs to see Kirk dermatologist to see what can be done about this. The patient voiced understanding. Subsequently were going to try to get him scheduled with someone and we did give him information for Kirk [facility] dermatology to see if they could get him in. He was thankful for this. In the meantime I will probably can put him on doxycycline to see if we can get this under control. Readmission: 02-02-2023 upon evaluation today patient appears to be doing actually better than the last time I saw him though I am not sure that his wounds had ever fully healed. Fortunately I do not see any evidence of infection locally nor systemically he does still have the issues with open areas on his legs that he tells me has been trying not to pick at them. He has not had any dressings recently just been using some antibiotic ointment as he ran out of what we are previously utilizing. Fortunately I do not see any signs of infection he denies having been using any cocaine  at this point. In fact he denies any use of recreational drugs. This is good news and he seems to be doing pretty well in pretty good shape. He does not really have Kirk reason for why he never came back after November. I think in  general he stated that he thought he was just getting better. Electronic Signature(s) Signed: 02/02/2023 7:23:57 AM By: Worthy Keeler PA-C Previous Signature: 01/29/2023 2:08:24 PM Version By: Worthy Keeler PA-C Entered By: Worthy Keeler on 02/02/2023 07:23:57 -------------------------------------------------------------------------------- Physical Exam Details Patient Name: Date of Service: [name], [name] 01/29/2023 10:00 [place] Record [name]: [id number] Patient Account [name]: [id number] Date of Birth/Sex: Treating RN: [birth year] (42 y.o. [name] Primary Care Provider: SYSTEM, PCP Other Clinician: Referring Provider: Treating Provider/Extender: [name], Referral Weeks in Treatment: 0 Constitutional sitting or standing blood pressure is within target range for patient.. pulse regular and within target range for patient.Marland Kitchen respirations regular, non-labored and within target range for patient.Marland Kitchen temperature within target range for patient.. Well-nourished and well-hydrated in no acute distress. Eyes conjunctiva clear no eyelid edema noted. pupils equal round and reactive to light and accommodation. Ears, Nose, Mouth, and Throat Gorey, Rorik ([id number]) [id number] Page 3 of 9 no gross abnormality of ear auricles or external auditory canals. normal hearing noted during conversation. mucus membranes moist. Respiratory normal breathing without difficulty. Cardiovascular 2+ dorsalis pedis/posterior tibialis pulses. no clubbing, cyanosis, significant edema, <3 sec cap refill. Musculoskeletal normal gait and posture. no significant deformity or arthritic changes, no loss or range of motion, no clubbing. Psychiatric this patient is able to make decisions and demonstrates good insight into disease process. Alert and Oriented x 3. pleasant and cooperative. Notes Upon inspection patient's wounds actually do not appear to be nearly as bad as  they were previous he does not have any significant necrotic debris to debride away and subsequently he also does not seem to have any signs of infection he seems to be in pretty good shape as far as I am concerned and I think with Kirk little bit of help we can get these wounds to close. He does have some areas that I still feel like he may be picking on as far as his arms are concerned and again I encouraged him to not be doing that obviously I think that skin to make things worse as far as his overall sores are concerned because these areas that he picks can become infected. He voiced understanding. Electronic Signature(s) Signed: 02/02/2023 7:24:39 AM By: Worthy Keeler PA-C Entered By: Worthy Keeler on 02/02/2023 07:24:39 -------------------------------------------------------------------------------- Physician Orders Details Patient Name: Date of Service: [name], [name] 01/29/2023 10:00 [place] [name]: [id number] Patient Account [name]: [id number] Date of Birth/Sex: Treating RN: 03-29-1981 (42 y.o. 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 Bathing/ Shower/ Hygiene May shower; gently cleanse wound with antibacterial soap, rinse and pat dry prior to dressing wounds No tub bath. Edema Control - Lymphedema /  Segmental Compressive  Device / Other Elevate, Exercise Daily and Kirk void Standing for Long Periods of Time. Elevate legs to the level of the heart and pump ankles as often as possible Elevate leg(s) parallel to the floor when sitting. DO YOUR BEST to sleep in the bed at night. DO NOT sleep in your recliner. Long hours of sitting in Kirk recliner leads to swelling of the legs and/or potential wounds on your backside. Wound Treatment Wound #7 - Lower Leg Wound Laterality: Left, Lateral Cleanser: [place]ram Ancillary Kit - 15 Day Supply (DME) (Generic) 3 x Per Week/30 Days Discharge Instructions: Use supplies as instructed; Kit contains: (15) Saline Bullets; (15) 3x3 Gauze; 15 pr Gloves Cleanser: Soap and Water 3 x Per Week/30 Days Discharge Instructions: Gently cleanse wound with antibacterial soap, rinse and pat dry prior to dressing wounds Prim Dressing: Xeroform 4x4-HBD (in/in) (DME) (Generic) 3 x Per Week/30 Days ary Discharge Instructions: Apply Xeroform 4x4-HBD (in/in) as directed Secondary Dressing: (BORDER) Zetuvit Plus SILICONE BORDER Dressing 5x5 (in/in) (DME) (Generic) 3 x Per Week/30 Days Barreira, [name] ([id number]) [id number] Page 4 of 9 Discharge Instructions: Please do not put silicone bordered dressings under wraps. Use non-bordered dressing only. Wound #8 - Lower Leg Wound Laterality: Right, Medial Cleanser: Soap and Water 3 x Per Week/30 Days Discharge Instructions: Gently cleanse wound with antibacterial soap, rinse and pat dry prior to dressing wounds Prim Dressing: Xeroform 4x4-HBD (in/in) (DME) (Generic) 3 x Per Week/30 Days ary Discharge Instructions: Apply Xeroform 4x4-HBD (in/in) as directed Secondary Dressing: (BORDER) Zetuvit Plus SILICONE BORDER Dressing 4x4 (in/in) (DME) (Generic) 3 x Per Week/30 Days Discharge Instructions: Please do not put silicone bordered dressings under wraps. Use non-bordered dressing only. Electronic Signature(s) Signed: 01/29/2023 1:48:47 PM  By: Levora Dredge Signed: 01/29/2023 2:10:36 PM By: Worthy Keeler PA-C Entered By: Levora Dredge on 01/29/2023 11:22:59 -------------------------------------------------------------------------------- Problem List Details Patient Name: Date of Service: [name], [name] 01/29/2023 10:00 [place] Record [name]: [id number] Patient Account [name]: [id number] Date of Birth/Sex: Treating RN: December 21, 1980 (42 y.o. 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/22/2024 No Yes L03.115 Cellulitis of right lower limb 01/29/2023 No Yes L03.116 Cellulitis of left lower limb 01/29/2023 No Yes L70.8 Other acne 01/29/2023 No Yes Inactive Problems Resolved Problems Electronic Signature(s) Signed: 01/29/2023 11:08:07 AM By: Worthy Keeler PA-C Entered By: Worthy Keeler on 01/29/2023 11:08:07 [name], [name] ([id number]) [id number] Page 5 of 9 -------------------------------------------------------------------------------- Progress Note Details Patient Name: Date of Service: [name], [name] 01/29/2023 10:00 [name]: [id number] Patient Account [name]: [id number] Date of Birth/Sex: Treating RN: 02/04/1981 (42 y.o. 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 He tells me that he has had the wounds on  the legs for quite some time and he has not used in that area in the past 6 months. Nonetheless he also has not been able to get him to heal based on what he tells me. He does tell me though that he continues to use and inject the fentanyl although last time was Kirk week ago. It has been discussed with him when he was in the hospital going into rehabilitation. With that being said he tells me the last time he did this that it really was not good and really was not helpful. Nonetheless I do believe this is still something that needs to be strongly considered and I had Kirk really good conversation with him today in this regard. I discussed that further and the plan. In regard to the wounds themselves the patient unfortunately is having Kirk lot of issues with necrotic eschar which is really tightly adhered in Kirk lot of places. I do believe that he has sufficient blood flow to heal though I think this is just where the tissue honestly which just destroyed the paren from infiltrating fentanyl when he was injecting. This is good to be more difficult to heal due to the deeper damage and I discussed that with him today as well he is also had cellulitis although that looks to be resolving to me at this point. The good news is he really does not have any other major medical problems other than substance abuse. 08-24-2022 upon evaluation today patient appears to be doing well currently in regard to his wounds in fact he is actually showing signs of significant improvement Kirk lot of the eschar as he is actually pulled off which I think have made Kirk big difference. With that being said I think  that we do need to try to debride away some of the areas today and he is in agreement with that plan. 08-31-2022 upon evaluation today patient appears to be doing well currently in regard to his wounds he is actually making some good progress here overall. Fortunately I do not see any signs of active infection. I do think he is going require some debridement Kirk couple of spots although Kirk lot of the wounds were very close to complete closure which is great news. 09-07-2022 upon evaluation today patient appears to be doing well currently in regard to his wounds. Has been tolerating the dressing changes without complication. Everything seems to be measuring better and looking smaller. It is going require some sharp debridement pretty much across the board. 11/6; patient here for wounds on his bilateral upper and lower extremities. He has been using Medihoney with Xeroform covering. These were initial wounds from using injectable fentanyl. The patient was told at 1 point that was laced with Xylazine. In any case his wounds look fairly good. Still 1 major open area on the right lateral leg right forearm and Kirk smaller area on the left forearm everything on the left leg seems to be just about epithelialized to me 09-21-2022 upon evaluation today patient appears to be doing well with regard to his wound is actually showing signs of excellent improvement pretty much across the board. I am very pleased with where things stand I do believe that he is headed in the right direction which is great news. No fevers, chills, nausea, vomiting, or diarrhea. 09-28-2022 upon evaluation today patient appears to be doing well in regard to his wounds most are doing better. Unfortunately he does seem to have some issues here with what  appears to be Kirk cystic acne. This is on the facial area. I think that he probably needs to see Kirk dermatologist to see what can be done about this. The patient voiced understanding. Subsequently  were going to try to get him scheduled with someone and we did give him information for Kirk [facility] dermatology to see if they could get him in. He was thankful for this. In the meantime I will probably can put him on doxycycline to see if we can get this under control. Readmission: 02-02-2023 upon evaluation today patient appears to be doing actually better than the last time I saw him though I am not sure that his wounds had ever fully healed. Fortunately I do not see any evidence of infection locally nor systemically he does still have the issues with open areas on his legs that he tells me has been trying not to pick at them. He has not had any dressings recently just been using some antibiotic ointment as he ran out of what we are previously utilizing. Fortunately I do not see any signs of infection he denies having been using any cocaine at this point. In fact he denies any use of recreational drugs. This is good news and he seems to be doing pretty well in pretty good shape. He does not really have Kirk reason for why he never came back after November. I think in general he stated that he thought he was just getting better. Patient History Information obtained from Patient. Allergies No Known Allergies Social History Former smoker, Marital Status - Single, Alcohol Use - Rarely, Drug Use - Prior History, Caffeine Use - Rarely. Review of Systems (ROS) Constitutional Symptoms (General Health) Denies complaints or symptoms of Fatigue, Fever, Chills, Marked Weight Change. Eyes Denies complaints or symptoms of Dry Eyes, Vision Changes, Glasses / Contacts. Ear/Nose/Mouth/Throat Denies complaints or symptoms of Difficult clearing ears, Sinusitis. Hematologic/Lymphatic Denies complaints or symptoms of Bleeding / Clotting Disorders, Human Immunodeficiency Virus. Respiratory Denies complaints or symptoms of Chronic or frequent coughs, Shortness of Breath. Cardiovascular Complains or has symptoms of  LE edema. [name], [name] ([id number]) [id number] Page 6 of 9 Gastrointestinal Denies complaints or symptoms of Frequent diarrhea, Nausea, Vomiting. Endocrine Denies complaints or symptoms of Hepatitis, Thyroid disease, Polydypsia (Excessive Thirst). Genitourinary Denies complaints or symptoms of Kidney failure/ Dialysis, Incontinence/dribbling. Immunological Denies complaints or symptoms of Hives, Itching. Integumentary (Skin) Complains or has symptoms of Wounds, Breakdown. Musculoskeletal Denies complaints or symptoms of Muscle Pain, Muscle Weakness. Neurologic Denies complaints or symptoms of Numbness/parasthesias, Focal/Weakness. Psychiatric Denies complaints or symptoms of Anxiety, Claustrophobia. Objective Constitutional sitting or standing blood pressure is within target range for patient.. pulse regular and within target range for patient.Marland Kitchen respirations regular, non-labored and within target range for patient.Marland Kitchen temperature within target range for patient.. Well-nourished and well-hydrated in no acute distress. Vitals Time Taken: 10:05 AM, Height: 73 in, Source: Stated, Weight: 220 lbs, Source: Stated, BMI: 29, Temperature: 97.8 F, Pulse: 60 bpm, Respiratory Rate: 18 breaths/min, Blood Pressure: 112/69 mmHg. Eyes conjunctiva clear no eyelid edema noted. pupils equal round and reactive to light and accommodation. Ears, Nose, Mouth, and Throat no gross abnormality of ear auricles or external auditory canals. normal hearing noted during conversation. mucus membranes moist. Respiratory normal breathing without difficulty. Cardiovascular 2+ dorsalis pedis/posterior tibialis pulses. no clubbing, cyanosis, significant edema, Musculoskeletal normal gait and posture. no significant deformity or arthritic changes, no loss or range of motion, no clubbing. Psychiatric this patient is able to make decisions and  demonstrates good insight into disease process.  Alert and Oriented x 3. pleasant and cooperative. General Notes: Upon inspection patient's wounds actually do not appear to be nearly as bad as they were previous he does not have any significant necrotic debris to debride away and subsequently he also does not seem to have any signs of infection he seems to be in pretty good shape as far as I am concerned and I think with Kirk little bit of help we can get these wounds to close. He does have some areas that I still feel like he may be picking on as far as his arms are concerned and again I encouraged him to not be doing that obviously I think that skin to make things worse as far as his overall sores are concerned because these areas that he picks can become infected. He voiced understanding. Integumentary (Hair, Skin) Wound #7 status is Open. Original cause of wound was Gradually Appeared. The date acquired was: 11/23/2022. The wound is located on the Left,Lateral Lower Leg. The wound measures 7.2cm length x 6.5cm width x 0.1cm depth; 36.757cm^2 area and 3.676cm^3 volume. There is Fat Layer (Subcutaneous Tissue) exposed. There is no tunneling or undermining noted. There is Kirk medium amount of serosanguineous drainage noted. There is large (67-100%) red, hyper - granulation within the wound bed. There is Kirk small (1-33%) amount of necrotic tissue within the wound bed including Adherent Slough. Wound #8 status is Open. Original cause of wound was Gradually Appeared. The date acquired was: 11/23/2022. The wound is located on the Right,Medial Lower Leg. The wound measures 2.5cm length x 1.7cm width x 0.1cm depth; 3.338cm^2 area and 0.334cm^3 volume. There is Fat Layer (Subcutaneous Tissue) exposed. There is no tunneling or undermining noted. There is Kirk medium amount of serosanguineous drainage noted. There is large (67-100%) red granulation within the wound bed. There is Kirk small (1-33%) amount of necrotic tissue within the wound bed including Adherent  Slough. 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 There was Kirk Excisional Skin/Subcutaneous Tissue Debridement with Kirk total area of 1 sq cm performed by Kirk Sams., PA-C. With the following instrument(s): Curette to remove Viable and Non-Viable tissue/material. Material removed includes Eschar, Subcutaneous Tissue, and Slough after achieving pain control using Lidocaine 4% T opical Solution. No specimens were taken. Kirk time out was conducted at 11:12, prior to the start of the procedure. Kirk Minimum amount of bleeding was controlled with Pressure. The procedure was tolerated well. Post Debridement Measurements: 7.2cm length x 6.5cm width x 0.1cm depth; 3.676cm^3 volume. Character of Wound/Ulcer Post Debridement is stable. Post procedure Diagnosis Wound #7: Same as Pre-Procedure Plan Follow-up Appointments: Return Appointment in 1 week. Bathing/ Shower/ Hygiene: May shower; gently cleanse wound with antibacterial soap, rinse and pat dry prior to dressing wounds No tub bath. Edema Control - Lymphedema / Segmental Compressive Device / Other: Elevate, Exercise Daily and Avoid Standing for Long Periods of Time. Elevate legs to the level of the heart and pump ankles as often as possible Elevate leg(s) parallel to the floor  when sitting. DO YOUR BEST to sleep in the bed at night. DO NOT sleep in your recliner. Long hours of sitting in Kirk recliner leads to swelling of the  legs and/or potential wounds on your backside. WOUND #7: - Lower Leg Wound Laterality: Left, Lateral Cleanser: [place] Ancillary Kit - 15 Day Supply (DME) (Generic) 3 x Per Week/30 Days Discharge Instructions: Use supplies as instructed; Kit contains: (15) Saline Bullets; (15) 3x3 Gauze; 15 pr Gloves Cleanser: Soap and Water 3 x Per Week/30 Days Discharge Instructions: Gently cleanse wound with antibacterial soap, rinse and pat dry prior to dressing wounds Prim Dressing: Xeroform 4x4-HBD (in/in) (DME) (Generic) 3 x Per Week/30 Days ary Discharge Instructions: Apply Xeroform 4x4-HBD (in/in) as directed Secondary Dressing: (BORDER) Zetuvit Plus SILICONE BORDER Dressing 5x5 (in/in) (DME) (Generic) 3 x Per Week/30 Days Discharge Instructions: Please do not put silicone bordered dressings under wraps. Use non-bordered dressing only. WOUND #8: - Lower Leg Wound Laterality: Right, Medial Cleanser: Soap and Water 3 x Per Week/30 Days Discharge Instructions: Gently cleanse wound with antibacterial soap, rinse and pat dry prior to dressing wounds Prim Dressing: Xeroform 4x4-HBD (in/in) (DME) (Generic) 3 x Per Week/30 Days ary Discharge Instructions: Apply Xeroform 4x4-HBD (in/in) as directed Secondary Dressing: (BORDER) Zetuvit Plus SILICONE BORDER Dressing 4x4 (in/in) (DME) (Generic) 3 x Per Week/30 Days Discharge Instructions: Please do not put silicone bordered dressings under wraps. Use non-bordered dressing only. 1. I would recommend currently that we have the patient continue to monitor for any signs of infection or worsening. Obviously if anything changes he knows contact the office and let me know. 2. Also can recommend that we have the patient continue with the Xeroform gauze dressings which I think will bite be the best way to go at this point. 3. Will also continue with the Zetuvit bordered foam dressing to cover which I think would do well as far as keeping his hands off of everything as  well. We will see patient back for reevaluation in 1 week here in the clinic. If anything worsens or changes patient will contact our office for additional recommendations. Electronic Signature(s) Signed: 02/02/2023 7:25:05 AM By: Worthy Keeler PA-C Entered By: Worthy Keeler on 02/02/2023 07:25:05 -------------------------------------------------------------------------------- ROS/PFSH Details Patient Name: Date of Service: YORIEL, CRISPELL 01/29/2023 10:00 [place] Record [name]: [id number] Patient Account [name]: [id number] Date of Birth/Sex: Treating RN: [birth year] (42 y.o. [name] Primary Care Provider: SYSTEM, PCP Other Clinician: Referring Provider: Treating Provider/Extender: [name], Referral Weeks in Treatment: 0 Information Obtained From Patient Constitutional Symptoms ([place]) Complaints and Symptoms: Negative for: Fatigue; Fever; Chills; Marked Weight Change Riede, [name] ([id number]) [id number] Page 8 of 9 Eyes Complaints and Symptoms: Negative for: Dry Eyes; Vision Changes; Glasses / Contacts Ear/Nose/Mouth/Throat Complaints and Symptoms: Negative for: Difficult clearing ears; Sinusitis Hematologic/Lymphatic Complaints and Symptoms: Negative for: Bleeding / Clotting Disorders; Human Immunodeficiency Virus Respiratory Complaints and Symptoms: Negative for: Chronic or frequent coughs; Shortness of Breath Cardiovascular Complaints and Symptoms: Positive for: LE edema Gastrointestinal Complaints and Symptoms: Negative for: Frequent diarrhea; Nausea; Vomiting Endocrine Complaints and Symptoms: Negative for: Hepatitis; Thyroid disease; Polydypsia (Excessive Thirst) Genitourinary Complaints and Symptoms: Negative for: Kidney failure/ Dialysis; Incontinence/dribbling Immunological Complaints and Symptoms: Negative for: Hives; Itching Integumentary (Skin) Complaints and Symptoms: Positive for: Wounds;  Breakdown Musculoskeletal Complaints and Symptoms: Negative for: Muscle Pain; Muscle Weakness Neurologic Complaints and Symptoms: Negative for: Numbness/parasthesias; Focal/Weakness Psychiatric Complaints and Symptoms: Negative for: Anxiety; Claustrophobia Oncologic Immunizations Pneumococcal Vaccine: Received Pneumococcal  Vaccination: No Implantable Devices None Family and Social History Former smoker; Marital Status - Single; Alcohol Use: Rarely; Drug Use: Prior History; Caffeine Use: Rarely; Financial Concerns: No; Food, Clothing or [place], [name] ([id number]) [id number] Page 9 of 9 Shelter Needs: No; Support System Lacking: No; Transportation Concerns: No Electronic Signature(s) Signed: 01/29/2023 1:48:47 PM By: Levora Dredge Signed: 01/29/2023 2:10:36 PM By: Worthy Keeler PA-C Entered By: Levora Dredge on 01/29/2023 10:10:51

## 2023-01-30 NOTE — Progress Notes (Signed)
[name], [name] ([id number]) [phone] [id number] Page 1 of 4 Visit Report for 01/29/2023 Abuse Risk Screen Details Patient Name: Date of Service: [name], [name] 01/29/2023 10:00 [place] Record Number: [id number] Patient Account Number: [id number] Date of Birth/Sex: Treating RN: August 11, 1981 (42 y.o. [name] Primary Care [name]: SYSTEM, PCP Other Clinician: Referring [name]: Treating [name]/Extender: [name], Referral Weeks in Treatment: 0 Abuse Risk Screen Items Answer ABUSE RISK SCREEN: Has anyone close to you tried to hurt or harm you recentlyo No Do you feel uncomfortable with anyone in your familyo No Has anyone forced you do things that you didnt want to doo No Electronic Signature(s) Signed: 01/29/2023 1:48:47 PM By: Levora Dredge Entered By: Levora Dredge on 01/29/2023 10:10:58 -------------------------------------------------------------------------------- Activities of Daily Living Details Patient Name: Date of Service: [name], [name] 01/29/2023 10:00 [place] Record Number: [id number] Patient Account Number: [id number] Date of Birth/Sex: Treating RN: [birth year] (42 y.o. [name] Primary Care [name]: SYSTEM, PCP Other Clinician: Referring [name]: Treating [name]: [name], Referral Weeks in Treatment: 0 Activities of Daily Living Items Answer Activities of Daily Living (Please select one for each item) Drive Automobile Completely Able T Medications ake Completely Able Use T elephone Completely Able Care for Appearance Completely Able Use T oilet Completely Able Bath / Shower Completely Able Dress [name] Completely Able Feed [name] Completely Able Walk Completely Able Get In / Out Bed Completely Able Housework Completely Able Prepare Meals Completely [place] for [name] Completely Able Electronic Signature(s) Signed: 01/29/2023 1:48:47 PM By: Levora Dredge Entered By: Levora Dredge on 01/29/2023 10:11:20 -------------------------------------------------------------------------------- Education Screening Details Patient Name: Date of Service: [name], [name] 01/29/2023 10:00 [place] Number: [id number] Patient Account Number: [id number] Date of Birth/Sex: Treating RN: 1981-01-14 (42 y.o. [name] Primary Care [name]: SYSTEM, PCP Other Clinician: Referring [name]: Treating [name]/Extender: [name], Referral Weeks in Treatment: 0 [name], Kirk Evert ([id number]) [phone] [id number] Page 2 of 4 Learning Preferences/Education Level/Primary Language Learning Preference: Explanation, Demonstration, Video, Data processing manager, Printed Material Preferred Language: Diplomatic Services operational officer Language Barrier: No Translator Needed: No Memory Deficit: No Emotional Barrier: No Cultural/Religious Beliefs Affecting Medical Care: No Physical Barrier Impaired Vision: No Impaired Hearing: No Decreased Hand dexterity: No Knowledge/Comprehension Knowledge Level: Medium Comprehension Level: Medium Ability to understand written instructions: Medium Ability to understand verbal instructions: Medium Motivation Anxiety Level: Calm Cooperation: Cooperative Education Importance: Acknowledges Need Interest in Health Problems: Asks Questions Perception: Coherent Willingness to Engage in [name]-Management High Activities: Readiness to Engage in [name]-Management High Activities: Electronic Signature(s) Signed: 01/29/2023 1:48:47 PM By: Levora Dredge Entered By: Levora Dredge on 01/29/2023 10:12:06 -------------------------------------------------------------------------------- Fall Risk Assessment Details Patient Name: Date of Service: [name], [name] 01/29/2023 10:00 [place] Number: [id number] Patient Account Number: [id number] Date of Birth/Sex: Treating RN: 1981-07-05 (42 y.o. [name] Primary Care [name]: SYSTEM, PCP Other Clinician: Referring [name]: Treating [name]/Extender: [name], Referral Weeks in Treatment: 0 Fall Risk Assessment Items Have you had 2 or more falls in the last 12 monthso 0 No Have you had any fall that resulted in injury in the last 12 monthso 0 No FALLS RISK SCREEN History of falling - immediate or within 3 months 0 No Secondary diagnosis (Do you have 2 or more medical diagnoseso) 0 No Ambulatory aid None/bed rest/wheelchair/nurse 0 Yes Crutches/cane/walker 0 No Furniture 0 No Intravenous therapy Access/Saline/Heparin Lock 0 No Gait/Transferring Normal/ bed rest/ wheelchair 0 Yes Weak (short steps with or without  shuffle, stooped but able to lift head while walking, may seek 0 No support from furniture) Impaired (short steps with shuffle, may have difficulty arising from chair, head down, impaired 0 No balance) Mental Status Oriented to own ability 0 Yes Electronic Signature(s) [name] ([id number]) [id number] Page 3 of 4 Signed: 01/29/2023 1:48:47 PM By: Levora Dredge Entered By: Levora Dredge on 01/29/2023 10:12:17 -------------------------------------------------------------------------------- Foot Assessment Details Patient Name: Date of Service: [name], [name] 01/29/2023 10:00 A M Medical Record Number: [id number] Patient Account Number: [id number] Date of Birth/Sex: Treating RN: 1981-05-07 (42 y.o. [name] Primary Care [name]: SYSTEM, PCP Other Clinician: Referring [name]: Treating [name]/Extender: [name], Referral Weeks in Treatment: 0 Foot Assessment Items Site Locations + = Sensation present, - = Sensation absent, C = Callus, U = Ulcer R = Redness, W = Warmth, M = Maceration, PU = Pre-ulcerative lesion F = Fissure, S = Swelling, D = Dryness Assessment Right: Left: Other Deformity: No No Prior Foot Ulcer: No No Prior  Amputation: No No Charcot Joint: No No Ambulatory Status: Ambulatory Without Help Gait: Steady Electronic Signature(s) Signed: 01/29/2023 1:48:47 PM By: Levora Dredge Entered By: Levora Dredge on 01/29/2023 10:27:01 -------------------------------------------------------------------------------- Nutrition Risk Screening Details Patient Name: Date of Service: [name], [name] 01/29/2023 10:00 A M Medical Record Number: [id number] Patient Account Number: [id number] Date of Birth/Sex: Treating RN: 05-Apr-1981 (42 y.o. [name] Primary Care Lashauna Arpin: SYSTEM, PCP Other Clinician: Referring Kahmya Pinkham: Treating Heith Haigler/Extender: [name], Referral Weeks in Treatment: 0 Height (in): 73 Weight (lbs): 220 Body Mass Index (BMI): 29 Hanley, Alphons ([id number]) [id number] Page 4 of 4 Nutrition Risk Screening Items Score Screening NUTRITION RISK SCREEN: I have an illness or condition that made me change the kind and/or amount of food I eat 0 No I eat fewer than two meals per day 0 No I eat few fruits and vegetables, or milk products 0 No I have three or more drinks of beer, liquor or wine almost every day 0 No I have tooth or mouth problems that make it hard for me to eat 0 No I don't always have enough money to buy the food I need 0 No I eat alone most of the time 0 No I take three or more different prescribed or over-the-counter drugs a day 0 No Without wanting to, I have lost or gained 10 pounds in the last six months 0 No I am not always physically able to shop, cook and/or feed myself 0 No Nutrition Protocols Good Risk Protocol 0 No interventions needed Moderate Risk Protocol High Risk Proctocol Risk Level: Good Risk Score: 0 Electronic Signature(s) Signed: 01/29/2023 1:48:47 PM By: Levora Dredge Entered By: Levora Dredge on 01/29/2023 10:12:37

## 2023-01-31 DIAGNOSIS — F1120 Opioid dependence, uncomplicated: Secondary | ICD-10-CM | POA: Diagnosis not present

## 2023-02-01 DIAGNOSIS — L97812 Non-pressure chronic ulcer of other part of right lower leg with fat layer exposed: Secondary | ICD-10-CM | POA: Diagnosis not present

## 2023-02-01 DIAGNOSIS — L98492 Non-pressure chronic ulcer of skin of other sites with fat layer exposed: Secondary | ICD-10-CM | POA: Diagnosis not present

## 2023-02-05 ENCOUNTER — Encounter: Payer: Medicare PPO | Admitting: Physician Assistant

## 2023-02-05 DIAGNOSIS — L03115 Cellulitis of right lower limb: Secondary | ICD-10-CM | POA: Diagnosis not present

## 2023-02-05 DIAGNOSIS — L97822 Non-pressure chronic ulcer of other part of left lower leg with fat layer exposed: Secondary | ICD-10-CM | POA: Diagnosis not present

## 2023-02-05 DIAGNOSIS — F19959 Other psychoactive substance use, unspecified with psychoactive substance-induced psychotic disorder, unspecified: Secondary | ICD-10-CM | POA: Diagnosis not present

## 2023-02-05 DIAGNOSIS — L97812 Non-pressure chronic ulcer of other part of right lower leg with fat layer exposed: Secondary | ICD-10-CM | POA: Diagnosis not present

## 2023-02-05 DIAGNOSIS — L03116 Cellulitis of left lower limb: Secondary | ICD-10-CM | POA: Diagnosis not present

## 2023-02-05 DIAGNOSIS — L708 Other acne: Secondary | ICD-10-CM | POA: Diagnosis not present

## 2023-02-05 NOTE — Progress Notes (Signed)
Kirk, [name] ([id number]) [id number] Page 1 of 7 Visit Report for 02/05/2023 Arrival Information Details Patient Name: Date of Service: [name], [name] 02/05/2023 8:00 A M Medical Record Number: [id number] Patient Account Number: [id number] Date of Birth/Sex: Treating RN: 1981/04/08 (42 y.o. Clide Dales Primary Care Jolene Guyett: SYSTEM, PCP Other Clinician: Referring Racine Erby: Treating Glena Pharris/Extender: Jeri Cos Self, Referral Weeks in Treatment: 1 Visit Information History Since Last Visit Added or deleted any medications: No Patient Arrived: Ambulatory Any new allergies or adverse reactions: No Arrival Time: 08:19 Had a fall or experienced change in No Accompanied By: self activities of daily living that may affect Transfer Assistance: None risk of falls: Patient Identification Verified: Yes Hospitalized since last visit: No Secondary Verification Process Completed: Yes Has Dressing in Place as Prescribed: Yes Pain Present Now: No Electronic Signature(s) Signed: 02/05/2023 1:35:48 PM By: Levora Dredge Entered By: Levora Dredge on 02/05/2023 08:20:28 -------------------------------------------------------------------------------- Encounter Discharge Information Details Patient Name: Date of Service: Kirk, [name] 02/05/2023 8:00 [place] Record Number: [id number] Patient Account Number: [id number] Date of Birth/Sex: Treating RN: Dec 13, 1980 (42 y.o. Clide Dales Primary Care Que Meneely: SYSTEM, PCP Other Clinician: Referring Racquel Arkin: Treating Jerrin Recore/Extender: Jeri Cos Self, Referral Weeks in Treatment: 1 Encounter Discharge Information Items Post Procedure Vitals Discharge Condition: Stable Temperature (F): 97.6 Ambulatory Status: Ambulatory Pulse (bpm): 75 Discharge Destination: Home Respiratory Rate (breaths/min): 18 Transportation: Private Auto Blood Pressure (mmHg): 143/84 Accompanied By: self Schedule Follow-up  Appointment: Yes Clinical Summary of Care: Electronic Signature(s) Signed: 02/05/2023 9:33:46 AM By: Levora Dredge Previous Signature: 02/05/2023 9:32:16 AM Version By: Levora Dredge Entered By: Levora Dredge on 02/05/2023 09:33:46 -------------------------------------------------------------------------------- Lower Extremity Assessment Details Patient Name: Date of Service: Kirk, [name] 02/05/2023 8:00 A M Medical Record Number: [id number] Patient Account Number: [id number] Date of Birth/Sex: Treating RN: 1981-01-02 (42 y.o. Clide Dales Primary Care Ebony Rickel: SYSTEM, PCP Other Clinician: Referring Keynan Heffern: Treating Luis Nickles/Extender: Jeri Cos Self, Referral Weeks in Treatment: 1 Edema Assessment Assessed: [Left: No] [Right: No] [Left: Edema] [Right: :] Calf Bily, Burnie ([id number]) [phone].pdf Page 2 of 7 Left: Right: Point of Measurement: 36 cm From Medial Instep 38.3 cm 37.8 cm Ankle Left: Right: Point of Measurement: 11 cm From Medial Instep 27.2 cm 28.3 cm Vascular Assessment Pulses: Dorsalis Pedis Palpable: [Left:Yes] [Right:Yes] Electronic Signature(s) Signed: 02/05/2023 1:35:48 PM By: Levora Dredge Entered By: Levora Dredge on 02/05/2023 08:36:07 -------------------------------------------------------------------------------- Multi Wound Chart Details Patient Name: Date of Service: Kirk, [name] 02/05/2023 8:00 A M Medical Record Number: [id number] Patient Account Number: [id number] Date of Birth/Sex: Treating RN: 1980/11/13 (42 y.o. Clide Dales Primary Care Izeyah Deike: SYSTEM, PCP Other Clinician: Referring Saahas Hidrogo: Treating Aerika Groll/Extender: Jeri Cos Self, Referral Weeks in Treatment: 1 Vital Signs Height(in): 73 Pulse(bpm): 75 Weight(lbs): 220 Blood Pressure(mmHg): 143/84 Body Mass Index(BMI): 29 Temperature(F): 97.6 Respiratory Rate(breaths/min): 18 [7:Photos:] [N/A:N/A] Left, Lateral Lower Leg  Right, Medial Lower Leg N/A Wound Location: Gradually Appeared Gradually Appeared N/A Wounding Event: Atypical Atypical N/A Primary Etiology: 11/23/2022 11/23/2022 N/A Date Acquired: 1 1 N/A Weeks of Treatment: Open Open N/A Wound Status: No No N/A Wound Recurrence: Yes No N/A Clustered Wound: 4 N/A N/A Clustered Quantity: 7x5.3x0.1 2.4x1.4x0.1 N/A Measurements L x W x D (cm) 29.138 2.639 N/A A (cm) : rea 2.914 0.264 N/A Volume (cm) : 20.70% 20.90% N/A % Reduction in A rea: 20.70% 21.00% N/A % Reduction in Volume: Full Thickness Without Exposed Full Thickness Without Exposed N/A Classification: Support Structures Support Structures Medium Medium N/A Exudate A mount: Serosanguineous Serosanguineous N/A Exudate Type: red,  brown red, brown N/A Exudate Color: Medium (34-66%) Large (67-100%) N/A Granulation A mount: Red, Hyper-granulation Red N/A Granulation Quality: Medium (34-66%) Small (1-33%) N/A Necrotic A mount: Fat Layer (Subcutaneous Tissue): Yes Fat Layer (Subcutaneous Tissue): Yes N/A Exposed Structures: Small (1-33%) None N/A Epithelialization: Treatment Notes [name], [name] ([id number]) [phone].pdf Page 3 of 7 Electronic Signature(s) Signed: 02/05/2023 1:35:48 PM By: Levora Dredge Entered By: Levora Dredge on 02/05/2023 08:47:26 -------------------------------------------------------------------------------- Multi-Disciplinary Care Plan Details Patient Name: Date of Service: Kirk, [name] 02/05/2023 8:00 A M Medical Record Number: [id number] Patient Account Number: [id number] Date of Birth/Sex: Treating RN: 1981-11-02 (42 y.o. Clide Dales Primary Care Sarp Vernier: SYSTEM, PCP Other Clinician: Referring Lanea Vankirk: Treating Owain Eckerman/Extender: Jeri Cos Self, Referral Weeks in Treatment: 1 Active Inactive Wound/Skin Impairment Nursing Diagnoses: Impaired tissue integrity Knowledge deficit related to  ulceration/compromised skin integrity Goals: Ulcer/skin breakdown will have a volume reduction of 30% by week 4 Date Initiated: 01/29/2023 Target Resolution Date: 02/26/2023 Goal Status: Active Ulcer/skin breakdown will have a volume reduction of 50% by week 8 Date Initiated: 01/29/2023 Target Resolution Date: 03/26/2023 Goal Status: Active Ulcer/skin breakdown will have a volume reduction of 80% by week 12 Date Initiated: 01/29/2023 Target Resolution Date: 04/23/2023 Goal Status: Active Ulcer/skin breakdown will heal within 14 weeks Date Initiated: 01/29/2023 Target Resolution Date: 05/07/2023 Goal Status: Active Interventions: Assess patient/caregiver ability to obtain necessary supplies Assess patient/caregiver ability to perform ulcer/skin care regimen upon admission and as needed Assess ulceration(s) every visit Provide education on ulcer and skin care Treatment Activities: Skin care regimen initiated : 01/29/2023 Notes: Electronic Signature(s) Signed: 02/05/2023 9:28:57 AM By: Levora Dredge Entered By: Levora Dredge on 02/05/2023 09:28:56 -------------------------------------------------------------------------------- Pain Assessment Details Patient Name: Date of Service: AZREAL, RIDGELL 02/05/2023 8:00 [place] Record Number: [id number] Patient Account Number: [id number] Date of Birth/Sex: Treating RN: 08-Mar-1981 (42 y.o. Clide Dales Primary Care Missi Mcmackin: SYSTEM, PCP Other Clinician: Referring Mariyanna Mucha: Treating Danese Dorsainvil/Extender: Jeri Cos Self, Referral Weeks in Treatment: 1 Active Problems Location of Pain Severity and Description of Pain Patient Has Paino No Site Locations Rate the pain. DARIVS, GOEDEKE ([id number]) [id number] Page 4 of 7 Rate the pain. Current Pain Level: 0 Pain Management and Medication Current Pain Management: Electronic Signature(s) Signed: 02/05/2023 1:35:48 PM By: Levora Dredge Entered By: Levora Dredge on 02/05/2023 08:22:20 -------------------------------------------------------------------------------- Patient/Caregiver Education Details Patient Name: Date of Service: Mackiewicz, Merrell 3/29/2024andnbsp8:00 [place] Record Number: [id number] Patient Account Number: [id number] Date of Birth/Gender: Treating RN: 03/05/1981 (42 y.o. Clide Dales Primary Care Physician: SYSTEM, PCP Other Clinician: Referring Physician: Treating Physician/Extender: Jeri Cos Self, Referral Weeks in Treatment: 1 Education Assessment Education Provided To: Patient Education Topics Provided Offloading: Handouts: How Offloading Helps Foot Wounds Heal, Other: education on elevation of bilat LL for swelling Methods: Explain/Verbal Responses: State content correctly Wound Debridement: Handouts: Wound Debridement Methods: Explain/Verbal Responses: State content correctly Wound/Skin Impairment: Handouts: Caring for Your Ulcer Methods: Explain/Verbal Responses: State content correctly Electronic Signature(s) Signed: 02/05/2023 1:35:48 PM By: Levora Dredge Entered By: Levora Dredge on 02/05/2023 09:29:34 -------------------------------------------------------------------------------- Wound Assessment Details Patient Name: Date of Service: REY, KUCZEK 02/05/2023 8:00 [place] Record Number: [id number] Patient Account Number: [id number] Date of Birth/Sex: Treating RN: 13-Feb-1981 (42 y.o. Clide Dales [place], [place] ([id number]) [phone].pdf Page 5 of 7 Primary Care Birdella Sippel: SYSTEM, PCP Other Clinician: Referring Clatie Kessen: Treating Breckan Cafiero/Extender: Jeri Cos Self, Referral Weeks in Treatment: 1 Wound Status Wound Number: 7 Primary Etiology: Atypical Wound Location: Left, Lateral Lower Leg Wound Status: Open Wounding Event: Gradually  Appeared Date Acquired: 11/23/2022 Weeks Of Treatment: 1 Clustered Wound: Yes Photos Wound  Measurements Length: (cm) 7 Width: (cm) 5.3 Depth: (cm) 0.1 Clustered Quantity: 4 Area: (cm) 29.138 Volume: (cm) 2.914 % Reduction in Area: 20.7% % Reduction in Volume: 20.7% Epithelialization: Small (1-33%) Tunneling: No Undermining: No Wound Description Classification: Full Thickness Without Exposed Sup Exudate Amount: Medium Exudate Type: Serosanguineous Exudate Color: red, brown port Structures Foul Odor After Cleansing: No Slough/Fibrino Yes Wound Bed Granulation Amount: Medium (34-66%) Exposed Structure Granulation Quality: Red, Hyper-granulation Fat Layer (Subcutaneous Tissue) Exposed: Yes Necrotic Amount: Medium (34-66%) Necrotic Quality: Adherent Slough Treatment Notes Wound #7 (Lower Leg) Wound Laterality: Left, Lateral Cleanser [place] Ancillary Kit - 15 Day Supply Discharge Instruction: Use supplies as instructed; Kit contains: (15) Saline Bullets; (15) 3x3 Gauze; 15 pr Gloves Soap and Water Discharge Instruction: Gently cleanse wound with antibacterial soap, rinse and pat dry prior to dressing wounds Peri-Wound Care Topical Primary Dressing Xeroform 4x4-HBD (in/in) Discharge Instruction: Apply Xeroform 4x4-HBD (in/in) as directed Secondary Dressing (BORDER) Zetuvit Plus SILICONE BORDER Dressing 5x5 (in/in) Discharge Instruction: Please do not put silicone bordered dressings under wraps. Use non-bordered dressing only. Secured With Compression Wrap Compression Stockings [place]wyn, [name] ([id number]) [id number] Page 6 of 7 Add-Ons Electronic Signature(s) Signed: 02/05/2023 1:35:48 PM By: Levora Dredge Entered By: Levora Dredge on 02/05/2023 08:37:50 -------------------------------------------------------------------------------- Wound Assessment Details Patient Name: Date of Service: GIANFRANCO, ELICK 02/05/2023 8:00 A M Medical Record Number: [id number] Patient Account Number: [id number] Date of Birth/Sex: Treating RN: 06-30-1981  (42 y.o. Clide Dales Primary Care Tessie Ordaz: SYSTEM, PCP Other Clinician: Referring Abdulahad Mederos: Treating Sakiyah Shur/Extender: Jeri Cos Self, Referral Weeks in Treatment: 1 Wound Status Wound Number: 8 Primary Etiology: Atypical Wound Location: Right, Medial Lower Leg Wound Status: Open Wounding Event: Gradually Appeared Date Acquired: 11/23/2022 Weeks Of Treatment: 1 Clustered Wound: No Photos Wound Measurements Length: (cm) 2.4 Width: (cm) 1.4 Depth: (cm) 0.1 Area: (cm) 2.639 Volume: (cm) 0.264 % Reduction in Area: 20.9% % Reduction in Volume: 21% Epithelialization: None Tunneling: No Undermining: No Wound Description Classification: Full Thickness Without Exposed Support Structures Exudate Amount: Medium Exudate Type: Serosanguineous Exudate Color: red, brown Foul Odor After Cleansing: No Slough/Fibrino Yes Wound Bed Granulation Amount: Large (67-100%) Exposed Structure Granulation Quality: Red Fat Layer (Subcutaneous Tissue) Exposed: Yes Necrotic Amount: Small (1-33%) Necrotic Quality: Adherent Slough Treatment Notes Wound #8 (Lower Leg) Wound Laterality: Right, Medial Cleanser Soap and Water Discharge Instruction: Gently cleanse wound with antibacterial soap, rinse and pat dry prior to dressing wounds Peri-Wound Care Topical Primary Dressing Xeroform 4x4-HBD (in/in) Discharge Instruction: Apply Xeroform 4x4-HBD (in/in) as directed Lollar, [name] ([id number].pdf Page 7 of 7 Secondary Dressing (BORDER) Zetuvit Plus SILICONE BORDER Dressing 4x4 (in/in) Discharge Instruction: Please do not put silicone bordered dressings under wraps. Use non-bordered dressing only. Secured With Compression Wrap Compression Stockings Environmental education officer) Signed: 02/05/2023 1:35:48 PM By: Levora Dredge Entered By: Levora Dredge on 02/05/2023  08:35:34 -------------------------------------------------------------------------------- Vitals Details Patient Name: Date of Service: TYDRE, OPALINSKI 02/05/2023 8:00 A M Medical Record Number: [id number] Patient Account Number: [id number] Date of Birth/Sex: Treating RN: 12-Jul-1981 (42 y.o. Clide Dales Primary Care Vanya Carberry: SYSTEM, PCP Other Clinician: Referring Mirza Kidney: Treating Damaso Laday/Extender: Jeri Cos Self, Referral Weeks in Treatment: 1 Vital Signs Time Taken: 08:20 Temperature (F): 97.6 Height (in): 73 Pulse (bpm): 75 Weight (lbs): 220 Respiratory Rate (breaths/min): 18 Body Mass Index (BMI): 29 Blood Pressure (mmHg): 143/84 Reference Range: 80 - 120 mg / dl Electronic Signature(s) Signed: 02/05/2023 1:35:48 PM By: Araceli Bouche,  Caitlin Entered By: Levora Dredge on 02/05/2023 08:21:57

## 2023-02-05 NOTE — Progress Notes (Addendum)
[name], [name] ([id number]) [id number] Page 1 of 8 Visit Report for 02/05/2023 Chief Complaint Document Details Patient Name: Date of Service: [name], [name] 02/05/2023 8:00 A M Medical Record Number: [id number] Patient Account Number: [id number] Date of Birth/Sex: Treating RN: 1981/06/24 (42 y.o. 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 [name] Primary Care Provider: SYSTEM, PCP Other Clinician: Referring Provider: Treating Provider/Extender: Jeri Cos Self, Referral Weeks in Treatment: 1 Debridement Performed for Assessment: Wound #7 Left,Lateral Lower Leg Performed By: Physician Tommie Sams., PA-C Debridement Type: Debridement Level of Consciousness (Pre-procedure): Awake and Alert Pre-procedure Verification/Time Out Yes - 08:49 Taken: Pain Control: Lidocaine 4% T opical Solution T Area Debrided (L x W): otal 3 (cm) x 3 (cm) = 9 (cm) Tissue and other material debrided: Viable, Non-Viable, Slough, Subcutaneous, Slough Level: Skin/Subcutaneous Tissue Debridement Description: Excisional Instrument: Curette Bleeding: Minimum Hemostasis Achieved: Pressure Response to Treatment: Procedure was tolerated well Level of Consciousness (Post- Awake and Alert procedure): Post Debridement  Measurements of Total Wound Length: (cm) 7 Width: (cm) 5.3 Depth: (cm) 0.1 Volume: (cm) 2.914 Character of Wound/Ulcer Post Debridement: Stable Post Procedure Diagnosis Same as Pre-procedure Electronic Signature(s) Signed: 02/05/2023 8:56:31 AM By: Worthy Keeler PA-C Signed: 02/05/2023 1:35:48 PM By: Levora Dredge Entered By: Worthy Keeler on 02/05/2023 08:56:31 Debridement Details -------------------------------------------------------------------------------- [name] ([id number]) [id number] Page 2 of 8 Patient Name: Date of Service: [name], [name] 02/05/2023 8:00 A M Medical Record Number: [id number] Patient Account Number: [id number] Date of Birth/Sex: Treating RN: Feb 13, 1981 (42 y.o. [name] Primary Care Provider: SYSTEM, PCP Other Clinician: Referring Provider: Treating Provider/Extender: Jeri Cos Self, Referral Weeks in Treatment: 1 Debridement Performed for Assessment: Wound #8 Right,Medial Lower Leg Performed By: Physician Tommie Sams., PA-C Debridement Type: Debridement Level of Consciousness (Pre-procedure): Awake and Alert Pre-procedure Verification/Time Out Yes - 08:48 Taken: Pain Control: Lidocaine 4% T opical Solution T Area Debrided (L x W): otal 2.4 (cm) x 1.4 (cm) = 3.36 (cm) Tissue and other material debrided: Viable, Non-Viable, Slough, Subcutaneous, Slough Level: Skin/Subcutaneous Tissue Debridement Description: Excisional Instrument: Curette Bleeding: Minimum Hemostasis Achieved: Pressure Response to Treatment: Procedure was tolerated well Level of Consciousness (Post- Awake and Alert procedure): Post Debridement Measurements of Total Wound Length: (cm) 2.4 Width: (cm) 1.4 Depth: (cm) 0.1 Volume: (cm) 0.264 Character of Wound/Ulcer Post Debridement: Stable Post Procedure Diagnosis Same as Pre-procedure Electronic Signature(s) Signed: 02/05/2023 8:56:40 AM By: Worthy Keeler PA-C Signed:  02/05/2023 1:35:48 PM By: Levora Dredge Entered By: Worthy Keeler on 02/05/2023 08:56:40 -------------------------------------------------------------------------------- HPI Details Patient Name: Date of Service: [name], [name] 02/05/2023 8:00 A M Medical Record Number: [id number] Patient Account Number: [id number] Date of Birth/Sex: Treating RN: 1981-04-03 (42 y.o. [name] Primary Care Provider: SYSTEM, PCP Other Clinician: Referring Provider: Treating Provider/Extender: Jeri Cos Self, Referral Weeks in Treatment: 1 History of Present Illness HPI Description: 08-17-2022 upon evaluation today patient has multiple wounds over the bilateral arms and lower extremities. 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 He tells me  that he has had the wounds on the legs for quite some time and he has not used in that area in the past 6 months. Nonetheless he also has not been able to get him to heal based on what he tells me. He does tell me though that he continues to use and inject the fentanyl although last time was a week ago. It has been discussed with him when he was in the hospital going into rehabilitation. With that being said he tells me the last time he did this that it really was not good and really was not helpful. Nonetheless I do believe this is still something that needs to be strongly considered and I had a really good conversation with him today in this regard. I discussed that further and the plan. In regard to the wounds themselves the patient unfortunately is having a lot of issues with necrotic eschar which is really tightly adhered in a lot of places. I do believe that he has sufficient blood flow to heal though I think this is just where the tissue honestly which just destroyed the paren from infiltrating fentanyl when he was injecting. This is good to be more difficult to heal due to the deeper damage and I discussed that with him today as  well he is also had cellulitis although that looks to be resolving to me at this point. The good news is he really does not have any other major medical problems other than substance abuse. 08-24-2022 upon evaluation today patient appears to be doing well currently in regard to his wounds in fact he is actually showing signs of significant improvement a lot of the eschar as he is actually pulled off which I think have made a big difference. With that being said I think that we do need to try to debride away some of the areas today and he is in agreement with that plan. 08-31-2022 upon evaluation today patient appears to be doing well currently in regard to his wounds he is actually making some good progress here overall. Fortunately I do not see any signs of active infection. I do think he is going require some debridement a couple of spots although a lot of the wounds were very close to complete closure which is great news. 09-07-2022 upon evaluation today patient appears to be doing well currently in regard to his wounds. Has been tolerating the dressing changes without complication. Everything seems to be measuring better and looking smaller. It is going require some sharp debridement pretty much across the board. [name], [name] ([id number]) [id number] Page 3 of 8 11/6; patient here for wounds on his bilateral upper and lower extremities. He has been using Medihoney with Xeroform covering. These were initial wounds from using injectable fentanyl. The patient was told at 1 point that was laced with Xylazine. In any case his wounds look fairly good. Still 1 major open area on the right lateral leg right forearm and a smaller area on the left forearm everything on the left leg seems to be just about epithelialized to me 09-21-2022 upon evaluation today patient appears to be doing well with regard to his wound is actually showing signs of excellent improvement pretty  much across the board. I am very pleased with where things stand I do believe that he is headed in the right direction which is great news. No fevers, chills, nausea, vomiting, or diarrhea. 09-28-2022 upon evaluation today patient appears to be doing well in regard to his  wounds most are doing better. Unfortunately he does seem to have some issues here with what appears to be a cystic acne. This is on the facial area. I think that he probably needs to see a dermatologist to see what can be done about this. The patient voiced understanding. Subsequently were going to try to get him scheduled with someone and we did give him information for a [facility] dermatology to see if they could get him in. He was thankful for this. In the meantime I will probably can put him on doxycycline to see if we can get this under control. Readmission: 02-02-2023 upon evaluation today patient appears to be doing actually better than the last time I saw him though I am not sure that his wounds had ever fully healed. Fortunately I do not see any evidence of infection locally nor systemically he does still have the issues with open areas on his legs that he tells me has been trying not to pick at them. He has not had any dressings recently just been using some antibiotic ointment as he ran out of what we are previously utilizing. Fortunately I do not see any signs of infection he denies having been using any cocaine at this point. In fact he denies any use of recreational drugs. This is good news and he seems to be doing pretty well in pretty good shape. He does not really have a reason for why he never came back after November. I think in general he stated that he thought he was just getting better. 02-05-2023 upon evaluation today patient appears to be doing well currently in regard to his wounds. He has been tolerating the dressing changes without complication. Fortunately I do not see any signs of active infection locally nor  systemically which is great news. He is currently on doxycycline that was prescribed by me by another prescriber. Electronic Signature(s) Signed: 02/05/2023 8:54:23 AM By: Worthy Keeler PA-C Entered By: Worthy Keeler on 02/05/2023 08:54:23 -------------------------------------------------------------------------------- Physical Exam Details Patient Name: Date of Service: [name], [name] 02/05/2023 8:00 A M Medical Record Number: [id number] Patient Account Number: [id number] Date of Birth/Sex: Treating RN: 1981/08/15 (42 y.o. [name] Primary Care Provider: SYSTEM, PCP Other Clinician: Referring Provider: Treating Provider/Extender: Jeri Cos Self, Referral Weeks in Treatment: 1 Constitutional Well-nourished and well-hydrated in no acute distress. Respiratory normal breathing without difficulty. Psychiatric this patient is able to make decisions and demonstrates good insight into disease process. Alert and Oriented x 3. pleasant and cooperative. Notes Upon inspection patient's wound bed actually showed signs of good granulation and epithelization at this point. Fortunately I do not see any evidence of infection locally or systemically which is great news and overall I am extremely pleased with where we stand currently. Electronic Signature(s) Signed: 02/05/2023 8:54:43 AM By: Worthy Keeler PA-C Entered By: Worthy Keeler on 02/05/2023 08:54:43 -------------------------------------------------------------------------------- Physician Orders Details Patient Name: Date of Service: [name], [name] 02/05/2023 8:00 A M Medical Record Number: [id number] Patient Account Number: [id number] Date of Birth/Sex: Treating RN: August 25, 1981 (42 y.o. 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 Bathing/ Shower/ Hygiene May shower; gently cleanse wound with antibacterial soap, rinse and pat dry prior to dressing wounds No tub bath. Edema Control - Lymphedema / Segmental Compressive Device / Other Tubigrip single layer applied. - size D bilateral Elevate, Exercise Daily and A void Standing for Long Periods of Time. Elevate legs to the level of the heart and pump ankles as often as possible Elevate leg(s) parallel to the floor when sitting. DO YOUR BEST to sleep in the bed at night. DO NOT sleep in your recliner. Long hours of sitting in a recliner leads to swelling of the legs and/or potential wounds on your backside. Wound Treatment Wound #7 - Lower Leg Wound Laterality: Left, Lateral Cleanser: Byram Ancillary Kit - 15 Day Supply (Generic) 3 x Per Week/30 Days Discharge Instructions: Use supplies as instructed; Kit contains: (15) Saline Bullets; (15) 3x3 Gauze; 15 pr Gloves Cleanser: Soap and Water 3 x Per Week/30 Days Discharge Instructions: Gently cleanse wound with antibacterial soap, rinse and pat dry prior to dressing wounds Prim Dressing: Xeroform 4x4-HBD (in/in) (Generic) 3 x Per Week/30 Days ary Discharge Instructions: Apply Xeroform 4x4-HBD (in/in) as directed Secondary Dressing: (BORDER) Zetuvit Plus SILICONE BORDER Dressing 5x5 (in/in) (Generic) 3 x Per Week/30 Days Discharge Instructions: Please do not put silicone bordered dressings  under wraps. Use non-bordered dressing only. Secured With: Tubigrip Size D, 3x10 (in/yd) 3 x Per Week/30 Days Discharge Instructions: single layer Wound #8 - Lower Leg Wound Laterality: Right, Medial Cleanser: Soap and Water 3 x Per Week/30 Days Discharge Instructions: Gently cleanse wound with antibacterial soap, rinse and pat dry prior to dressing wounds Prim Dressing: Xeroform 4x4-HBD (in/in) (Generic) 3 x Per Week/30 Days ary Discharge Instructions: Apply Xeroform 4x4-HBD (in/in) as directed Secondary Dressing: (BORDER) Zetuvit Plus SILICONE BORDER Dressing 4x4 (in/in) (Generic) 3 x Per Week/30 Days Discharge Instructions: Please do not put silicone bordered dressings under wraps. Use non-bordered dressing only. Secured With: Tubigrip Size D, 3x10 (in/yd) 3 x Per Week/30 Days Discharge Instructions: single layer Electronic Signature(s) Signed: 02/05/2023 1:35:48 PM By: Levora Dredge Signed: 02/05/2023 2:14:36 PM By: Worthy Keeler PA-C Entered By: Levora Dredge on 02/05/2023 09:35:38 -------------------------------------------------------------------------------- Problem List Details Patient Name: Date of Service: [name], [name] 02/05/2023 8:00 A M Medical Record Number: [id number] Patient Account Number: [id number] Date of Birth/Sex: Treating RN: Mar 31, 1981 (42 y.o. 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, unspecified with psychoactive substance- 01/29/2023 No Yes induced psychotic disorder, unspecified L97.812 Non-pressure chronic ulcer of other part of right lower leg with fat layer 01/29/2023 No Yes exposed L97.822 Non-pressure chronic ulcer of other part  of left lower leg with fat layer exposed3/22/2024 No Yes L03.115 Cellulitis of right lower limb 01/29/2023 No Yes L03.116 Cellulitis of left lower limb 01/29/2023 No Yes L70.8 Other acne 01/29/2023 No Yes Inactive Problems Resolved Problems Electronic Signature(s) Signed: 02/05/2023 8:29:14 AM By: Worthy Keeler PA-C Entered By: Worthy Keeler on 02/05/2023 08:29:13 -------------------------------------------------------------------------------- Progress Note Details Patient Name: Date of Service: [name], [name] 02/05/2023 8:00 A M Medical Record Number: [id number] Patient Account Number: [id number] Date of Birth/Sex: Treating RN:  1981/07/08 (42 y.o. 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 He tells me that he has had the wounds on the legs for quite some time and he has not used in that area in the past 6 months. Nonetheless he also has not been able to get him to heal based on what he tells me. He does tell me though that he continues to use and inject the fentanyl although last time was a week ago. It has been discussed with him when he was in the hospital going into rehabilitation. With that being said he tells me the last time he did this that it really was not good and really was not helpful. Nonetheless I do believe this is still something that needs to be strongly considered and I had a really good conversation with him today in this regard. I discussed that further and the plan. In regard to the wounds themselves the patient  unfortunately is having a lot of issues with necrotic eschar which is really tightly adhered in a lot of places. I do believe that he has sufficient blood flow to heal though I think this is just where the tissue honestly which just destroyed the paren from infiltrating fentanyl when he was injecting. This is good to be more difficult to heal due to the deeper damage and I discussed that with him today as well he is also had cellulitis although that looks to be resolving to me at this point. The good news is he really does not have any other major medical problems other than substance abuse. 08-24-2022 upon evaluation today patient appears to be doing well currently in regard to his wounds in fact he is actually showing signs of significant improvement a lot of the eschar as he is actually pulled off which I think have made a big difference. With that being said I think that we do need to try to debride away some of the areas today and he is in agreement with that plan. 08-31-2022 upon evaluation today patient appears to be doing well currently in regard to his wounds he is actually making some good progress here overall. Fortunately I do not see any signs of active infection. I do think he is going require some debridement a couple of spots although a lot of the wounds were very close to complete closure which is great news. 09-07-2022 upon evaluation today patient appears to be doing well currently in regard to his wounds. Has been tolerating the dressing changes without complication. Everything seems to be measuring better and looking smaller. It is going require some sharp debridement pretty much across the board. [name], [name] ([id number]) [id number] Page 6 of 8 11/6; patient here for wounds on his bilateral upper and lower extremities. He has been using Medihoney with Xeroform covering. These were initial wounds from using injectable fentanyl. The patient was told at  1 point that was laced with Xylazine. In any case his wounds look fairly good. Still 1 major open area on the right lateral leg right forearm and a smaller area on the left forearm everything on the left leg seems to  be just about epithelialized to me 09-21-2022 upon evaluation today patient appears to be doing well with regard to his wound is actually showing signs of excellent improvement pretty much across the board. I am very pleased with where things stand I do believe that he is headed in the right direction which is great news. No fevers, chills, nausea, vomiting, or diarrhea. 09-28-2022 upon evaluation today patient appears to be doing well in regard to his wounds most are doing better. Unfortunately he does seem to have some issues here with what appears to be a cystic acne. This is on the facial area. I think that he probably needs to see a dermatologist to see what can be done about this. The patient voiced understanding. Subsequently were going to try to get him scheduled with someone and we did give him information for a [facility] dermatology to see if they could get him in. He was thankful for this. In the meantime I will probably can put him on doxycycline to see if we can get this under control. Readmission: 02-02-2023 upon evaluation today patient appears to be doing actually better than the last time I saw him though I am not sure that his wounds had ever fully healed. Fortunately I do not see any evidence of infection locally nor systemically he does still have the issues with open areas on his legs that he tells me has been trying not to pick at them. He has not had any dressings recently just been using some antibiotic ointment as he ran out of what we are previously utilizing. Fortunately I do not see any signs of infection he denies having been using any cocaine at this point. In fact he denies any use of recreational drugs. This is good news and he seems to be doing pretty well in  pretty good shape. He does not really have a reason for why he never came back after November. I think in general he stated that he thought he was just getting better. 02-05-2023 upon evaluation today patient appears to be doing well currently in regard to his wounds. He has been tolerating the dressing changes without complication. Fortunately I do not see any signs of active infection locally nor systemically which is great news. He is currently on doxycycline that was prescribed by me by another prescriber. Objective Constitutional Well-nourished and well-hydrated in no acute distress. Vitals Time Taken: 8:20 AM, Height: 73 in, Weight: 220 lbs, BMI: 29, Temperature: 97.6 F, Pulse: 75 bpm, Respiratory Rate: 18 breaths/min, Blood Pressure: 143/84 mmHg. Respiratory normal breathing without difficulty. Psychiatric this patient is able to make decisions and demonstrates good insight into disease process. Alert and Oriented x 3. pleasant and cooperative. General Notes: Upon inspection patient's wound bed actually showed signs of good granulation and epithelization at this point. Fortunately I do not see any evidence of infection locally or systemically which is great news and overall I am extremely pleased with where we stand currently. Integumentary (Hair, Skin) Wound #7 status is Open. Original cause of wound was Gradually Appeared. The date acquired was: 11/23/2022. The wound has been in treatment 1 weeks. The wound is located on the Left,Lateral Lower Leg. The wound measures 7cm length x 5.3cm width x 0.1cm depth; 29.138cm^2 area and 2.914cm^3 volume. There is Fat Layer (Subcutaneous Tissue) exposed. There is no tunneling or undermining noted. There is a medium amount of serosanguineous drainage noted. There is medium (34-66%) red, hyper - granulation within the wound bed. There is a  medium (34-66%) amount of necrotic tissue within the wound bed including Adherent Slough. Wound #8 status is  Open. Original cause of wound was Gradually Appeared. The date acquired was: 11/23/2022. The wound has been in treatment 1 weeks. The wound is located on the Right,Medial Lower Leg. The wound measures 2.4cm length x 1.4cm width x 0.1cm depth; 2.639cm^2 area and 0.264cm^3 volume. There is Fat Layer (Subcutaneous Tissue) exposed. There is no tunneling or undermining noted. There is a medium amount of serosanguineous drainage noted. There is large (67-100%) red granulation within the wound bed. There is a small (1-33%) amount of necrotic tissue within the wound bed including Adherent Slough. 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 There was a Excisional Skin/Subcutaneous Tissue Debridement with a total area of 9 sq cm performed by Tommie Sams., PA-C. With the following instrument(s): Curette to remove Viable and Non-Viable tissue/material. Material removed includes Subcutaneous Tissue and Slough and after achieving pain control using Lidocaine 4% T opical Solution. No specimens were taken. A time out was conducted at 08:49, prior to the start of the procedure. A Minimum amount of bleeding was controlled with Pressure. The procedure was tolerated well. Post Debridement Measurements: 7cm length x 5.3cm width x 0.1cm depth; 2.914cm^3 volume. Character of Wound/Ulcer Post Debridement is stable. Post procedure Diagnosis Wound #7: Same as Pre-Procedure Wound #8 Pre-procedure  diagnosis of Wound #8 is an Atypical located on the Right,Medial Lower Leg . There was a Excisional Skin/Subcutaneous Tissue Debridement with a total area of 3.36 sq cm performed by Tommie Sams., PA-C. With the following instrument(s): Curette to remove Viable and Non-Viable tissue/material. Material removed includes Subcutaneous Tissue and Slough and after achieving pain control using Lidocaine 4% T opical Solution. No specimens were taken. A time out was conducted at 08:48, prior to the start of the procedure. A Minimum amount of bleeding was controlled with Pressure. The procedure was tolerated well. Post Debridement Measurements: 2.4cm length x 1.4cm width x 0.1cm depth; 0.264cm^3 volume. Character of Wound/Ulcer Post Debridement is stable. Post procedure Diagnosis Wound #8: Same as Pre-Procedure Plan Follow-up Appointments: Return Appointment in 1 week. Bathing/ Shower/ Hygiene: May shower; gently cleanse wound with antibacterial soap, rinse and pat dry prior to dressing wounds No tub bath. Edema Control - Lymphedema / Segmental Compressive Device / Other: Elevate, Exercise Daily and Avoid Standing for Long Periods of Time. Elevate legs to the level of the heart and pump ankles as often as possible Elevate leg(s) parallel to the floor when sitting. DO YOUR BEST to sleep in the bed at night. DO NOT sleep in your recliner. Long hours of sitting in a recliner leads to swelling of the legs and/or potential wounds on your backside. WOUND #7: - Lower Leg Wound Laterality: Left, Lateral Cleanser: Byram Ancillary Kit - 15 Day Supply (Generic) 3 x Per Week/30 Days Discharge Instructions: Use supplies as instructed; Kit contains: (15) Saline Bullets; (15) 3x3 Gauze; 15 pr Gloves Cleanser: Soap and Water 3 x Per Week/30 Days Discharge Instructions: Gently cleanse wound with antibacterial soap, rinse and  pat dry prior to dressing wounds Prim Dressing: Xeroform 4x4-HBD (in/in) (Generic) 3 x Per  Week/30 Days ary Discharge Instructions: Apply Xeroform 4x4-HBD (in/in) as directed Secondary Dressing: (BORDER) Zetuvit Plus SILICONE BORDER Dressing 5x5 (in/in) (Generic) 3 x Per Week/30 Days Discharge Instructions: Please do not put silicone bordered dressings under wraps. Use non-bordered dressing only. WOUND #8: - Lower Leg Wound Laterality: Right, Medial Cleanser: Soap and Water 3 x Per Week/30 Days Discharge Instructions: Gently cleanse wound with antibacterial soap, rinse and pat dry prior to dressing wounds Prim Dressing: Xeroform 4x4-HBD (in/in) (Generic) 3 x Per Week/30 Days ary Discharge Instructions: Apply Xeroform 4x4-HBD (in/in) as directed Secondary Dressing: (BORDER) Zetuvit Plus SILICONE BORDER Dressing 4x4 (in/in) (Generic) 3 x Per Week/30 Days Discharge Instructions: Please do not put silicone bordered dressings under wraps. Use non-bordered dressing only. 1. I would recommend that we have the patient continue to monitor for any signs of infection or worsening. Based on what I am seeing I do believe that he is doing well with the Xeroform and continue as such she will continue with the bordered foam dressings to cover. 2. Also can recommend that we continue with the recommendation for compression and I think that he can use Tubigrip size D single-layer to help with some of the swelling at this point he is in agreement with that plan will see how things go. We will see patient back for reevaluation in 1 week here in the clinic. If anything worsens or changes patient will contact our office for additional recommendations. Electronic Signature(s) Signed: 02/05/2023 9:07:13 AM By: Worthy Keeler PA-C Previous Signature: 02/05/2023 8:56:51 AM Version By: Worthy Keeler PA-C Previous Signature: 02/05/2023 8:55:16 AM Version By: Worthy Keeler PA-C Entered By: Worthy Keeler on 02/05/2023  09:07:13 -------------------------------------------------------------------------------- SuperBill Details Patient Name: Date of Service: [name], [name] 02/05/2023 Medical Record Number: [id number] Patient Account Number: [id number] Date of Birth/Sex: Treating RN: 1980/11/20 (42 y.o. 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 L03.115 Cellulitis of right lower limb L03.116 Cellulitis of left lower limb L70.8 Other acne Facility Procedures : CPT4 Code: [id number] Description: [id number] - DEB SUBQ TISSUE 20 SQ CM/< ICD-10 Diagnosis Description L97.812 Non-pressure chronic ulcer of other part of right lower leg with fat layer exp [id number] Non-pressure chronic ulcer of other part of left lower leg with fat layer expo Modifier: osed sed Quantity: 1 Physician Procedures : CPT4 Code Description Modifier [id number] - WC PHYS SUBQ TISS 20 SQ CM ICD-10 Diagnosis Description [id number] Non-pressure chronic ulcer of other part of right lower leg with fat layer exposed L97.822 Non-pressure chronic ulcer of other part of left  lower leg with fat layer exposed Quantity: 1 Electronic Signature(s) Signed: 02/05/2023 9:28:38 AM By: Levora Dredge Signed: 02/05/2023 2:14:36 PM By: Worthy Keeler PA-C Previous Signature: 02/05/2023 8:57:01 AM Version By: Worthy Keeler PA-C Entered By: Levora Dredge on 02/05/2023 [id number]

## 2023-02-12 ENCOUNTER — Ambulatory Visit: Payer: Medicare PPO | Admitting: Physician Assistant

## 2023-04-12 ENCOUNTER — Encounter: Payer: Medicare HMO | Attending: Physician Assistant | Admitting: Physician Assistant

## 2023-04-12 DIAGNOSIS — S81801A Unspecified open wound, right lower leg, initial encounter: Secondary | ICD-10-CM | POA: Diagnosis not present

## 2023-04-12 DIAGNOSIS — L97822 Non-pressure chronic ulcer of other part of left lower leg with fat layer exposed: Secondary | ICD-10-CM | POA: Insufficient documentation

## 2023-04-12 DIAGNOSIS — L708 Other acne: Secondary | ICD-10-CM | POA: Insufficient documentation

## 2023-04-12 DIAGNOSIS — L97812 Non-pressure chronic ulcer of other part of right lower leg with fat layer exposed: Secondary | ICD-10-CM | POA: Diagnosis not present

## 2023-04-12 DIAGNOSIS — L03115 Cellulitis of right lower limb: Secondary | ICD-10-CM | POA: Insufficient documentation

## 2023-04-12 DIAGNOSIS — S81802A Unspecified open wound, left lower leg, initial encounter: Secondary | ICD-10-CM | POA: Diagnosis not present

## 2023-04-12 DIAGNOSIS — L03116 Cellulitis of left lower limb: Secondary | ICD-10-CM | POA: Diagnosis not present

## 2023-04-13 NOTE — Progress Notes (Signed)
BEJAMIN, [name] ([id number]) [id number] Page 1 of 4 Visit Report for 04/12/2023 Abuse Risk Screen Details Patient Name: Date of Service: [name], [name] 04/12/2023 8:15 A M Medical Record Number: [id number] Patient Account Number: [id number] Date of Birth/Sex: Treating RN: Apr 02, 1981 (43 y.o. [name] Primary Care Crissie Aloi: SYSTEM, PCP Other Clinician: Referring Rudolpho Claxton: Treating Macie Baum/Extender: Allen Derry Self, Referral Weeks in Treatment: 0 Abuse Risk Screen Items Answer ABUSE RISK SCREEN: Has anyone close to you tried to hurt or harm you recentlyo No Do you feel uncomfortable with anyone in your familyo No Has anyone forced you do things that you didnt want to doo No Electronic Signature(s) Signed: 04/12/2023 5:12:07 PM By: Angelina Pih Entered By: Angelina Pih on 04/12/2023 08:46:13 -------------------------------------------------------------------------------- Activities of Daily Living Details Patient Name: Date of Service: [name], [name] 04/12/2023 8:15 A M Medical Record Number: [id number] Patient Account Number: [id number] Date of Birth/Sex: Treating RN: 01-01-1981 (42 y.o. [name] Primary Care Jermanie Minshall: SYSTEM, PCP Other Clinician: Referring Jaydi Bray: Treating Aven Christen/Extender: Allen Derry Self, Referral Weeks in Treatment: 0 Activities of Daily Living Items Answer Activities of Daily Living (Please select one for each item) Drive Automobile Completely Able T Medications ake Completely Able Use T elephone Completely Able Care for Appearance Completely Able Use T oilet Completely Able Bath / Shower Completely Able Dress Self Completely Able Feed Self Completely Able Walk Completely Able Get In / Out Bed Completely Able Housework Completely Able Prepare Meals Completely Able Handle Money Completely Able Shop for Self Completely Able Electronic Signature(s) Signed: 04/12/2023 5:12:07 PM By: Angelina Pih Entered By: Angelina Pih on 04/12/2023 08:46:31 -------------------------------------------------------------------------------- Education Screening Details Patient Name: Date of Service: [name], [name] 04/12/2023 8:15 A M Medical Record Number: [id number] Patient Account Number: [id number] Date of Birth/Sex: Treating RN: [birth year] (42 y.o. [name] Primary Care Cassity Christian: SYSTEM, PCP Other Clinician: Referring Varnika Butz: Treating Arlanda Shiplett/Extender: Allen Derry Self, Referral Weeks in Treatment: 0 [name], [name] ([id number]) [id number] Page 2 of 4 Learning Preferences/Education Level/Primary Language Learning Preference: Explanation, Demonstration, Video, Public affairs consultant, Printed Material Preferred Language: Economist Language Barrier: No Translator Needed: No Memory Deficit: No Emotional Barrier: No Cultural/Religious Beliefs Affecting Medical Care: No Physical Barrier Impaired Vision: No Impaired Hearing: No Decreased Hand dexterity: No Knowledge/Comprehension Knowledge Level: Medium Comprehension Level: Medium Ability to understand written instructions: Medium Ability to understand verbal instructions: Medium Motivation Anxiety Level: Calm Cooperation: Cooperative Education Importance: Acknowledges Need Interest in Health Problems: Asks Questions Perception: Coherent Willingness to Engage in Self-Management High Activities: Readiness to Engage in Self-Management High Activities: Electronic Signature(s) Signed: 04/12/2023 5:12:07 PM By: Angelina Pih Entered By: Angelina Pih on 04/12/2023 08:46:47 -------------------------------------------------------------------------------- Fall Risk Assessment Details Patient Name: Date of Service: [name], [name] 04/12/2023 8:15 A M Medical Record Number: [id number] Patient Account Number: [id number] Date of Birth/Sex: Treating RN: 1980/12/09 (42 y.o. [name] Primary Care Karrigan Messamore: SYSTEM, PCP Other Clinician: Referring Charlestine Rookstool: Treating Jonatan Wilsey/Extender: Allen Derry Self, Referral Weeks in Treatment: 0 Fall Risk Assessment Items Have you had 2 or more falls in the last 12 monthso 0 No Have you had any fall that resulted in injury in the last 12 monthso 0 No FALLS RISK SCREEN History of falling - immediate or within 3 months 0 No Secondary diagnosis (Do you have 2 or more medical diagnoseso) 0 No Ambulatory aid None/bed rest/wheelchair/nurse 0 Yes Crutches/cane/walker 0 No Furniture 0 No Intravenous therapy Access/Saline/Heparin Lock 0 No Gait/Transferring Normal/ bed rest/ wheelchair 0 Yes Weak (short steps with or without  shuffle, stooped but able to lift head while walking, may seek 0 No support from furniture) Impaired (short steps with shuffle, may have difficulty arising from chair, head down, impaired 0 No balance) Mental Status Oriented to own ability 0 Yes Electronic Signature(s) Burley, [name] ([id number]) [id number] Page 3 of 4 Signed: 04/12/2023 5:12:07 PM By: Angelina Pih Entered By: Angelina Pih on 04/12/2023 08:46:55 -------------------------------------------------------------------------------- Foot Assessment Details Patient Name: Date of Service: [name], [name] 04/12/2023 8:15 A M Medical Record Number: [id number] Patient Account Number: [id number] Date of Birth/Sex: Treating RN: 1981-06-03 (42 y.o. [name] Primary Care Kylia Grajales: SYSTEM, PCP Other Clinician: Referring Giovanie Lefebre: Treating Karon Cotterill/Extender: Allen Derry Self, Referral Weeks in Treatment: 0 Foot Assessment Items Site Locations + = Sensation present, - = Sensation absent, C = Callus, U = Ulcer R = Redness, W = Warmth, M = Maceration, PU = Pre-ulcerative lesion F = Fissure, S = Swelling, D = Dryness Assessment Right: Left: Other Deformity: No No Prior Foot Ulcer: No No Prior  Amputation: No No Charcot Joint: No No Ambulatory Status: Ambulatory Without Help Gait: Steady Electronic Signature(s) Signed: 04/12/2023 5:12:07 PM By: Angelina Pih Entered By: Angelina Pih on 04/12/2023 08:55:15 -------------------------------------------------------------------------------- Nutrition Risk Screening Details Patient Name: Date of Service: [name], [name] 04/12/2023 8:15 A M Medical Record Number: [id number] Patient Account Number: [id number] Date of Birth/Sex: Treating RN: Apr 06, 1981 (42 y.o. [name] Primary Care Kimmberly Wisser: SYSTEM, PCP Other Clinician: Referring Josefine Fuhr: Treating Somer Trotter/Extender: Allen Derry Self, Referral Weeks in Treatment: 0 Height (in): 73 Weight (lbs): 220 Body Mass Index (BMI): 29 Mcartor, Dimetrius ([id number]) [id number].pdf Page 4 of 4 Nutrition Risk Screening Items Score Screening NUTRITION RISK SCREEN: I have an illness or condition that made me change the kind and/or amount of food I eat 0 No I eat fewer than two meals per day 0 No I eat few fruits and vegetables, or milk products 0 No I have three or more drinks of beer, liquor or wine almost every day 0 No I have tooth or mouth problems that make it hard for me to eat 0 No I don't always have enough money to buy the food I need 0 No I eat alone most of the time 0 No I take three or more different prescribed or over-the-counter drugs a day 0 No Without wanting to, I have lost or gained 10 pounds in the last six months 0 No I am not always physically able to shop, cook and/or feed myself 0 No Nutrition Protocols Good Risk Protocol 0 No interventions needed Moderate Risk Protocol High Risk Proctocol Risk Level: Good Risk Score: 0 Electronic Signature(s) Signed: 04/12/2023 5:12:07 PM By: Angelina Pih Entered By: Angelina Pih on 04/12/2023 08:47:07

## 2023-04-15 ENCOUNTER — Other Ambulatory Visit: Payer: Self-pay

## 2023-04-15 DIAGNOSIS — L97822 Non-pressure chronic ulcer of other part of left lower leg with fat layer exposed: Secondary | ICD-10-CM | POA: Diagnosis not present

## 2023-04-15 DIAGNOSIS — L97812 Non-pressure chronic ulcer of other part of right lower leg with fat layer exposed: Secondary | ICD-10-CM | POA: Diagnosis not present

## 2023-04-15 DIAGNOSIS — L03115 Cellulitis of right lower limb: Secondary | ICD-10-CM | POA: Diagnosis not present

## 2023-04-15 DIAGNOSIS — L03116 Cellulitis of left lower limb: Secondary | ICD-10-CM | POA: Diagnosis not present

## 2023-04-15 DIAGNOSIS — L708 Other acne: Secondary | ICD-10-CM | POA: Diagnosis not present

## 2023-04-16 NOTE — Progress Notes (Signed)
[name], [name] ([id number]) [id number] Page 1 of 2 Visit Report for 04/15/2023 Physician Orders Details Patient Name: Date of Service: [name], [name] 04/15/2023 11:30 A M Medical Record Number: [id number] Patient Account Number: [id number] Date of Birth/Sex: Treating RN: [birth year] (42 y.o. Laymond Purser Primary Care Provider: SYSTEM, PCP Other Clinician: Betha Loa Referring Provider: Treating Provider/Extender: Allen Derry Self, Referral Weeks in Treatment: 0 Verbal / Phone Orders: Yes Clinician: Angelina Pih Read Back and Verified: Yes Diagnosis Coding Follow-up Appointments Return Appointment in 1 week. Nurse Visit as needed - Thursday to re wrap bilateral legs Bathing/ Shower/ Hygiene May shower; gently cleanse wound with antibacterial soap, rinse and pat dry prior to dressing wounds No tub bath. Anesthetic (Use 'Patient Medications' Section for Anesthetic Order Entry) Lidocaine applied to wound bed Edema Control - Lymphedema / Segmental Compressive Device / Other Elevate, Exercise Daily and A void Standing for Long Periods of Time. Elevate legs to the level of the heart and pump ankles as often as possible Elevate leg(s) parallel to the floor when sitting. DO YOUR BEST to sleep in the bed at night. DO NOT sleep in your recliner. Long hours of sitting in a recliner leads to swelling of the legs and/or potential wounds on your backside. Wound Treatment Wound #10 - Lower Leg Wound Laterality: Left, Lateral, Distal Cleanser: Soap and Water 2 x Per Week/30 Days Discharge Instructions: Gently cleanse wound with antibacterial soap, rinse and pat dry prior to dressing wounds Prim Dressing: Hydrofera Blue Ready Transfer Foam, 2.5x2.5 (in/in) 2 x Per Week/30 Days ary Discharge Instructions: Apply Hydrofera Blue Ready to wound bed as directed Secondary Dressing: Zetuvit Plus 4x4 (in/in) 2 x Per Week/30 Days Compression Wrap: Urgo K2, two layer  compression system, regular 2 x Per Week/30 Days Wound #11 - Lower Leg Wound Laterality: Left, Medial Cleanser: Soap and Water 2 x Per Week/30 Days Discharge Instructions: Gently cleanse wound with antibacterial soap, rinse and pat dry prior to dressing wounds Prim Dressing: Hydrofera Blue Ready Transfer Foam, 2.5x2.5 (in/in) 2 x Per Week/30 Days ary Discharge Instructions: Apply Hydrofera Blue Ready to wound bed as directed Secondary Dressing: Zetuvit Plus 4x4 (in/in) 2 x Per Week/30 Days Compression Wrap: Urgo K2, two layer compression system, regular 2 x Per Week/30 Days Wound #12 - Lower Leg Wound Laterality: Right, Lateral Cleanser: Soap and Water 2 x Per Week/30 Days Discharge Instructions: Gently cleanse wound with antibacterial soap, rinse and pat dry prior to dressing wounds Prim Dressing: Hydrofera Blue Ready Transfer Foam, 2.5x2.5 (in/in) 2 x Per Week/30 Days ary Discharge Instructions: Apply Hydrofera Blue Ready to wound bed as directed Secondary Dressing: Zetuvit Plus 4x4 (in/in) 2 x Per Week/30 Days Compression Wrap: Urgo K2, two layer compression system, regular 2 x Per Week/30 Days Wound #13 - Lower Leg Wound Laterality: Right, Medial Cleanser: Soap and Water 2 x Per Week/30 Days Discharge Instructions: Gently cleanse wound with antibacterial soap, rinse and pat dry prior to dressing wounds Prim Dressing: Hydrofera Blue Ready Transfer Foam, 2.5x2.5 (in/in) 2 x Per Week/30 Days ary Discharge Instructions: Apply Hydrofera Blue Ready to wound bed as directed [name], [name] ([id number]) [id number] Page 2 of 2 Secondary Dressing: Zetuvit Plus 4x4 (in/in) 2 x Per Week/30 Days Compression Wrap: Urgo K2, two layer compression system, regular 2 x Per Week/30 Days Wound #9 - Lower Leg Wound Laterality: Left, Lateral, Proximal Cleanser: Soap and Water 2 x Per Week/30 Days Discharge Instructions: Gently cleanse wound with antibacterial soap, rinse and pat  dry prior  to dressing wounds Prim Dressing: Hydrofera Blue Ready Transfer Foam, 2.5x2.5 (in/in) 2 x Per Week/30 Days ary Discharge Instructions: Apply Hydrofera Blue Ready to wound bed as directed Secondary Dressing: Zetuvit Plus 4x4 (in/in) 2 x Per Week/30 Days Compression Wrap: Urgo K2, two layer compression system, regular 2 x Per Week/30 Days Electronic Signature(s) Signed: 04/15/2023 4:39:56 PM By: Allen Derry PA-C Signed: 04/15/2023 4:55:34 PM By: Betha Loa Entered By: Betha Loa on 04/15/2023 11:37:30 -------------------------------------------------------------------------------- SuperBill Details Patient Name: Date of Service: [name], [name] 04/15/2023 Medical Record Number: [id number] Patient Account Number: [id number] Date of Birth/Sex: Treating RN: 1980-11-28 (42 y.o. 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 Modifier: Quantity: 1 Electronic Signature(s) Signed: 04/15/2023 4:39:56 PM By: Allen Derry PA-C Signed: 04/15/2023 4:55:34 PM By: Betha Loa Entered By: Betha Loa on 04/15/2023 11:59:51

## 2023-04-19 ENCOUNTER — Encounter: Payer: Self-pay | Admitting: Physician Assistant

## 2023-04-19 ENCOUNTER — Ambulatory Visit (INDEPENDENT_AMBULATORY_CARE_PROVIDER_SITE_OTHER): Payer: Medicare HMO | Admitting: Physician Assistant

## 2023-04-19 ENCOUNTER — Ambulatory Visit (INDEPENDENT_AMBULATORY_CARE_PROVIDER_SITE_OTHER): Payer: Medicare HMO

## 2023-04-19 VITALS — BP 124/70 | HR 95 | Temp 98.2°F | Ht 73.0 in | Wt 220.0 lb

## 2023-04-19 VITALS — BP 128/80 | HR 99 | Ht 73.0 in | Wt 220.0 lb

## 2023-04-19 DIAGNOSIS — K6289 Other specified diseases of anus and rectum: Secondary | ICD-10-CM | POA: Diagnosis not present

## 2023-04-19 DIAGNOSIS — D72829 Elevated white blood cell count, unspecified: Secondary | ICD-10-CM | POA: Diagnosis not present

## 2023-04-19 DIAGNOSIS — F424 Excoriation (skin-picking) disorder: Secondary | ICD-10-CM | POA: Diagnosis not present

## 2023-04-19 DIAGNOSIS — R202 Paresthesia of skin: Secondary | ICD-10-CM | POA: Diagnosis not present

## 2023-04-19 DIAGNOSIS — Z0000 Encounter for general adult medical examination without abnormal findings: Secondary | ICD-10-CM | POA: Diagnosis not present

## 2023-04-19 DIAGNOSIS — Z1159 Encounter for screening for other viral diseases: Secondary | ICD-10-CM

## 2023-04-19 DIAGNOSIS — Z7689 Persons encountering health services in other specified circumstances: Secondary | ICD-10-CM | POA: Diagnosis not present

## 2023-04-19 DIAGNOSIS — F54 Psychological and behavioral factors associated with disorders or diseases classified elsewhere: Secondary | ICD-10-CM | POA: Insufficient documentation

## 2023-04-19 DIAGNOSIS — Z87898 Personal history of other specified conditions: Secondary | ICD-10-CM

## 2023-04-19 MED ORDER — HYDROCORTISONE (PERIANAL) 2.5 % EX CREA: 1.0000 | TOPICAL_CREAM | Freq: Two times a day (BID) | CUTANEOUS | 2 refills | Status: DC

## 2023-04-19 NOTE — Progress Notes (Signed)
Subjective:   Tajai Ihde is a 42 y.o. male who presents for Medicare Annual/Subsequent preventive examination.  Review of Systems    Pt in office       Objective:    Today's Vitals   04/19/23 0959  PainSc: 0-No pain   There is no height or weight on file to calculate BMI.     04/19/2023   10:02 AM 12/14/2022    2:55 AM 09/30/2022    9:41 AM 07/17/2022    9:10 AM 04/19/2022    7:22 AM 03/13/2019   11:05 PM 02/04/2019   10:39 PM  Advanced Directives  Does Patient Have a Medical Advance Directive? No No No No No No No  Would patient like information on creating a medical advance directive?    No - Patient declined No - Patient declined No - Patient declined     Current Medications (verified) Outpatient Encounter Medications as of 04/19/2023  Medication Sig   clotrimazole-betamethasone (LOTRISONE) cream Apply topically. (Patient not taking: Reported on 04/19/2023)   No facility-administered encounter medications on file as of 04/19/2023.    Allergies (verified) Patient has no known allergies.   History: Past Medical History:  Diagnosis Date   Allergy    Opioid abuse (HCC) 03/2019   on-going daily use of IV Fentanyl   History reviewed. No pertinent surgical history. Family History  Problem Relation Age of Onset   Hypertension Mother    Diabetes Mother    Social History   Socioeconomic History   Marital status: Single    Spouse name: Not on file   Number of children: 2   Years of education: Not on file   Highest education level: Not on file  Occupational History   Not on file  Tobacco Use   Smoking status: Never   Smokeless tobacco: Never  Vaping Use   Vaping Use: Some days   Devices: once a week  Substance and Sexual Activity   Alcohol use: No   Drug use: Not Currently    Types: IV    Comment: Fentanyl   Sexual activity: Yes  Other Topics Concern   Not on file  Social History Narrative   Not on file   Social Determinants of Health   Financial  Resource Strain: Low Risk  (04/19/2023)   Overall Financial Resource Strain (CARDIA)    Difficulty of Paying Living Expenses: Not hard at all  Recent Concern: Financial Resource Strain - High Risk (04/19/2023)   Overall Financial Resource Strain (CARDIA)    Difficulty of Paying Living Expenses: Hard  Food Insecurity: No Food Insecurity (04/19/2023)   Hunger Vital Sign    Worried About Running Out of Food in the Last Year: Never true    Ran Out of Food in the Last Year: Never true  Transportation Needs: No Transportation Needs (04/19/2023)   PRAPARE - Administrator, Civil Service (Medical): No    Lack of Transportation (Non-Medical): No  Physical Activity: Not on file  Stress: Not on file  Social Connections: Not on file    Tobacco Counseling Counseling given: Not Answered   Clinical Intake:  Pre-visit preparation completed: Yes  Pain : No/denies pain Pain Score: 0-No pain     Diabetes: No     Diabetic?no  Interpreter Needed?: No      Activities of Daily Living     No data to display          Patient Care Team: Pcp, No as PCP -  General  Indicate any recent Medical Services you may have received from other than [facility] providers in the past year (date may be approximate).     Assessment:   This is a routine wellness examination for [place].  Hearing/Vision screen Hearing Screening - Comments:: No hearing problems  Dietary issues and exercise activities discussed:     Goals Addressed   None   Depression Screen    04/19/2023    9:48 AM  PHQ 2/9 Scores  PHQ - 2 Score 1  PHQ- 9 Score 4    Fall Risk    04/19/2023    9:49 AM  Fall Risk   Falls in the past year? 0  Number falls in past yr: 0  Injury with Fall? 0  Risk for fall due to : No Fall Risks  Follow up Falls evaluation completed    FALL RISK PREVENTION PERTAINING TO THE HOME:  Any stairs in or around the home? No  If so, are there any without handrails? No  Home free of  loose throw rugs in walkways, pet beds, electrical cords, etc? Yes  Adequate lighting in your home to reduce risk of falls? Yes   ASSISTIVE DEVICES UTILIZED TO PREVENT FALLS:  Life alert? No  Use of a cane, walker or w/c? No  Grab bars in the bathroom? No  Shower chair or bench in shower? No  Elevated toilet seat or a handicapped toilet? No   TIMED UP AND GO:  Was the test performed? No .  Length of time to ambulate 10 feet: n/a sec.   Gait steady and fast without use of assistive device  Cognitive Function:        04/19/2023   10:03 AM  6CIT Screen  What Year? 0 points  What month? 0 points  What time? 0 points  Count back from 20 0 points  Months in reverse 0 points  Repeat phrase 0 points  Total Score 0 points    Immunizations  There is no immunization history on file for this patient.  TDAP status: Due, Education has been provided regarding the importance of this vaccine. Advised may receive this vaccine at local pharmacy or Health Dept. Aware to provide a copy of the vaccination record if obtained from local pharmacy or Health Dept. Verbalized acceptance and understanding.  Flu Vaccine status: Due, Education has been provided regarding the importance of this vaccine. Advised may receive this vaccine at local pharmacy or Health Dept. Aware to provide a copy of the vaccination record if obtained from local pharmacy or Health Dept. Verbalized acceptance and understanding.   Covid-19 vaccine status: Declined, Education has been provided regarding the importance of this vaccine but patient still declined. Advised may receive this vaccine at local pharmacy or Health Dept.or vaccine clinic. Aware to provide a copy of the vaccination record if obtained from local pharmacy or Health Dept. Verbalized acceptance and understanding.  Qualifies for Shingles Vaccine? No   Zostavax completed No   Shingrix Completed?: No.    Education has been provided regarding the importance of  this vaccine. Patient has been advised to call insurance company to determine out of pocket expense if they have not yet received this vaccine. Advised may also receive vaccine at local pharmacy or Health Dept. Verbalized acceptance and understanding.  Screening Tests Health Maintenance  Topic Date Due   Hepatitis C Screening  Never done   DTaP/Tdap/Td (1 - Tdap) Never done   COVID-19 Vaccine (1) 05/01/2023 (Originally 03/27/1982)  INFLUENZA VACCINE  06/10/2023   Medicare Annual Wellness (AWV)  04/18/2024   HIV Screening  Completed   HPV VACCINES  Aged Out    Health Maintenance  Health Maintenance Due  Topic Date Due   Hepatitis C Screening  Never done   DTaP/Tdap/Td (1 - Tdap) Never done     Lung Cancer Screening: (Low Dose CT Chest recommended if Age 59-80 years, 30 pack-year currently smoking OR have quit w/in 15years.) does not qualify.   Lung Cancer Screening Referral: n/a  Additional Screening:  Hepatitis C Screening: does qualify; not completed  Vision Screening: Recommended annual ophthalmology exams for early detection of glaucoma and other disorders of the eye. Is the patient up to date with their annual eye exam?  Yes  Who is the provider or what is the name of the office in which the patient attends annual eye exams? [phone]- contacts per patient If pt is not established with a provider, would they like to be referred to a provider to establish care? No .   Dental Screening: Recommended annual dental exams for proper oral hygiene  Community Resource Referral / Chronic Care Management: CRR required this visit?  No   CCM required this visit?  No      Plan:     I have personally reviewed and noted the following in the patient's chart:   Medical and social history Use of alcohol, tobacco or illicit drugs  Current medications and supplements including opioid prescriptions. Patient is not currently taking opioid prescriptions. Functional ability and  status Nutritional status Physical activity Advanced directives List of other physicians Hospitalizations, surgeries, and ER visits in previous 12 months Vitals Screenings to include cognitive, depression, and falls Referrals and appointments  In addition, I have reviewed and discussed with patient certain preventive protocols, quality metrics, and best practice recommendations. A written personalized care plan for preventive services as well as general preventive health recommendations were provided to patient.    Eulis Canner Nigel Wessman, CMA   04/19/2023   Nurse Notes: none

## 2023-04-19 NOTE — Progress Notes (Signed)
Date:  04/19/2023   Name:  [name]   DOB:  November 11, 1980   MRN:  [id number]   Chief Complaint: Establish Care  HPI [name] is a very pleasant 42 year old male with a history of IVDA, opioid dependence, and acquired asplenia who presents new to our practice today to establish care. He raises a few concerns today:  Hx IVDA - Opioids (fentanyl, heroin, oral opioids). Has been with [facility] in [place] since Nov 2023, relapse of IV opioid Feb 2024 but has not used since. Switching clinic to [facility] location, still [employer]. Neg HIV screen Oct 2023, but I do not see any prior Hep screenings.   Skin complaints -history of numerous skin complaints, several evaluations in the ED. They initially thought scabies, but permethrin not very effective.  At follow-up was given doxycycline which seemed to help. 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 Says sometimes he feels like something moving "in my crack" but denies itching. Also mentions things seem to be crawling in/around the nostrils. Reestablished with wound care last week for LE wounds "from injecting", next visit tomorrow.    Medication list has been reviewed and updated.  Current Meds  Medication Sig   hydrocortisone (ANUSOL-HC) 2.5 % rectal cream Place 1 Application rectally 2 (two) times daily.     Review of Systems  Constitutional:  Negative for fatigue and fever.  Respiratory:  Negative for chest tightness and shortness of breath.   Cardiovascular:  Negative for chest pain and palpitations.  Gastrointestinal:  Negative for abdominal pain.    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  History reviewed. No pertinent surgical history.  Social History   Tobacco Use   Smoking status: Never   Smokeless tobacco: Never  Vaping Use   Vaping Use: Some days   Devices: once a week  Substance Use Topics   Alcohol use: No   Drug use: Not Currently    Types: IV    Comment: Fentanyl    Family History  Problem Relation Age of Onset   Hypertension Mother    Diabetes Mother         04/19/2023    9:48 AM  GAD 7 : Generalized Anxiety Score  Nervous, Anxious, on Edge 0  Control/stop worrying 0  Worry too much - different things 1  Trouble relaxing 1  Restless 0  Easily annoyed or irritable 0  Afraid - awful might happen 0  Total GAD 7 Score 2  Anxiety Difficulty Not difficult at all       04/19/2023    9:48 AM  Depression screen PHQ 2/9  Decreased Interest 1  Down, Depressed, Hopeless 0  PHQ - 2 Score 1  Altered sleeping 1  Tired, decreased energy 1  Change in appetite 0  Feeling bad or failure about yourself  1  Trouble concentrating 0  Moving slowly or fidgety/restless 0  Suicidal thoughts 0  PHQ-9 Score 4  Difficult doing work/chores Not difficult at all    BP Readings from Last 3 Encounters:  04/19/23 128/80  04/19/23 124/70  12/28/22 (!) 141/92    Wt Readings from Last 3 Encounters:  04/19/23 220 lb (99.8 kg)  04/19/23 220 lb (99.8 kg)  12/28/22 224 lb 13.9 oz (102 kg)    BP 124/70 (BP Location: Right Arm, Patient Position: Sitting, Cuff Size: Large)   Pulse 95   Temp 98.2 F (36.8 C) (Oral)   Ht 6\' 1"  (1.854 m)   Wt 220 lb (99.8 kg)   SpO2 99%   BMI 29.03 kg/m   Physical Exam Vitals and nursing note reviewed.  Constitutional:      Appearance: Normal appearance.  HENT:     Right Ear: Tympanic membrane and ear canal normal.     Left Ear: Tympanic membrane and ear canal normal.     Nose: Nose normal.      Mouth/Throat:     Mouth: Mucous membranes are moist. No oral lesions.     Dentition: Abnormal dentition.     Tongue: No lesions.     Pharynx: Oropharynx is clear. Uvula midline. No oropharyngeal exudate.     Comments: Plaque buildup on teeth with gingival disease appreciated.  Cardiovascular:     Rate and Rhythm: Normal rate and regular rhythm.     Heart sounds: No murmur heard.    No friction rub. No gallop.  Pulmonary:     Effort: Pulmonary effort is normal.     Breath sounds: Normal breath sounds.  Skin:    Comments: Numerous large scars of the majority of exposed skin including all 4 extremities, face, scalp, and neck. There are no skin tracts/burrows appreciated of either hand/wrist/forearm. Fingernails are elongated and visibly soiled. Possible early/small abscess formation LEFT antecubital fossa with recent excoriation, not overtly fluctuant or tender, no surrounding erythema. Brief inspection of intergluteal space reveals external hemorrhoids and inflammation of the anus. Possibly some candidiasis of perineum but mild. DRE deferred today.      Recent Labs     Component Value Date/Time   NA 140 12/28/2022 1904   NA 138 09/15/2021 0000   K 4.3 12/28/2022 1904   CL 103 12/28/2022 1904   CO2 29 12/28/2022 1904   GLUCOSE 85 12/28/2022 1904   BUN 9 12/28/2022 1904   BUN 5 09/15/2021 0000   CREATININE 1.05 12/28/2022 1904   CALCIUM 9.1 12/28/2022 1904   PROT 7.8 12/28/2022 1904   ALBUMIN 3.9 12/28/2022 1904   AST 16 12/28/2022 1904   ALT 9 12/28/2022 1904   ALKPHOS 77 12/28/2022 1904   BILITOT 0.4 12/28/2022 1904   GFRNONAA >60 12/28/2022 1904   GFRAA >60 03/13/2019 2319    Lab Results  Component Value Date   WBC 11.3 (H) 12/28/2022   HGB 13.1 12/28/2022   HCT 41.4 12/28/2022   MCV 93.5 12/28/2022   PLT 324 12/28/2022   Lab Results  Component Value Date   HGBA1C 5.9 (H) 02/05/2019   No results found for: "CHOL", "HDL", "LDLCALC", "LDLDIRECT", "TRIG",  "CHOLHDL" Lab Results  Component Value Date   TSH 1.435 03/13/2019     Assessment and Plan:  1. Encounter to establish care Patient new to clinic, seems relatively healthy. Incidental finding gingival disease, oral hygiene reviewed. Advised daily flossing and twice daily brushing. Would benefit from formal dental cleaning.   2. Proctitis Discussed with patient this may be responsible for the non-pruritic "moving" sensation in/around the anus. Try Anusol as below, applied with latex gloves.  - hydrocortisone (ANUSOL-HC) 2.5 % rectal cream; Place 1 Application rectally 2 (  two) times daily.  Dispense: 30 g; Refill: 2  3. Excoriation (skin-picking) disorder Likely due to history of IVDA, currently on methadone, consider also concurrent mood disorder though PHQ/GAD normal today.  Advised patient that fingernail hygiene is extremely important and management of this condition to avoid potential damage from excoriation/scratching and also reduce risk of skin infection.  Advised patient to clip or file the fingernails, reviewed proper handwashing technique including getting under the nails with each wash.  Patient verbalizes understanding.  Consider SSRI in the future  4. Psychogenic formication Plan as above.   5. History of intravenous drug use in remission Check hepatitis panel along with baseline labs - Comprehensive metabolic panel - CBC with Differential/Platelet - Hepatitis C antibody - Hepatitis B surface antibody,qualitative - Hepatitis B Core Antibody, total - Hepatitis B Surface AntiGEN  6. Leukocytosis, unspecified type Trending down for past few years, repeat CBC today - Comprehensive metabolic panel - CBC with Differential/Platelet  7. Need for hepatitis C screening test Screen for hep C - Hepatitis C antibody  8. Need for hepatitis B screening test Screen for hep B - Hepatitis B surface antibody,qualitative - Hepatitis B Core Antibody, total - Hepatitis B Surface  AntiGEN   Return in about 10 days (around 04/29/2023) for OV f/u skin/nails.   AWV also completed today by Laurian Brim, CMA  Partially dictated using Dragon software. Any errors are unintentional.  Alvester Morin, PA-C, DMSc, Nutritionist [facility] [phone]

## 2023-04-19 NOTE — Patient Instructions (Signed)
-  It was a pleasure to see you today! Please review your visit summary for helpful information -Lab results are usually available within 1-2 days and we will call once reviewed -I would encourage you to follow your care via MyChart where you can access lab results, notes, messages, and more -If you feel that we did a nice job today, please complete your after-visit survey and leave us a Google review! Your CMA today was Kieandra and your provider was Dan , PA-C, DMSc -Please return for follow-up in about 10-14 days  

## 2023-04-20 ENCOUNTER — Ambulatory Visit: Payer: Medicare PPO | Admitting: Physician Assistant

## 2023-04-23 ENCOUNTER — Telehealth: Payer: Self-pay | Admitting: Physician Assistant

## 2023-04-23 NOTE — Telephone Encounter (Signed)
LVM to call back for answer, PEC may give information.

## 2023-04-23 NOTE — Telephone Encounter (Signed)
Please advise 

## 2023-04-23 NOTE — Telephone Encounter (Signed)
Medication Refill - Medication: permetherin cream bacitracin ointment   Has the patient contacted their pharmacy? Yes.    (Agent: If yes, when and what did the pharmacy advise?) Contact PCP   Preferred Pharmacy (with phone number or street name): [facility]   Has the patient been seen for an appointment in the last year OR does the patient have an upcoming appointment? Yes.    Agent: Please be advised that RX refills may take up to 3 business days. We ask that you follow-up with your pharmacy.

## 2023-04-27 ENCOUNTER — Ambulatory Visit: Payer: Medicare HMO | Admitting: Physician Assistant

## 2023-04-29 ENCOUNTER — Encounter: Payer: Self-pay | Admitting: Physician Assistant

## 2023-04-29 ENCOUNTER — Ambulatory Visit: Payer: Medicare HMO | Admitting: Physician Assistant

## 2023-04-29 ENCOUNTER — Telehealth: Payer: Self-pay

## 2023-04-29 ENCOUNTER — Ambulatory Visit (INDEPENDENT_AMBULATORY_CARE_PROVIDER_SITE_OTHER): Payer: Medicare HMO | Admitting: Physician Assistant

## 2023-04-29 VITALS — BP 108/74 | HR 79 | Temp 97.6°F | Ht 73.0 in | Wt 219.0 lb

## 2023-04-29 DIAGNOSIS — F54 Psychological and behavioral factors associated with disorders or diseases classified elsewhere: Secondary | ICD-10-CM | POA: Diagnosis not present

## 2023-04-29 DIAGNOSIS — R202 Paresthesia of skin: Secondary | ICD-10-CM | POA: Diagnosis not present

## 2023-04-29 DIAGNOSIS — F424 Excoriation (skin-picking) disorder: Secondary | ICD-10-CM

## 2023-04-29 DIAGNOSIS — K6289 Other specified diseases of anus and rectum: Secondary | ICD-10-CM

## 2023-04-29 NOTE — Progress Notes (Signed)
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 He endorses mites/scabies/bugs in the skin of his scalp, crawling in his ears, nostrils, fingernails, and rectum. States when he picks at the skin, all of a sudden it gets hard as if the bugs are trying not to get caught. He reports actually picking some of them off and collecting them in a plastic bag which he forgot for the past two visits. He says they are incredibly small (visible only with magnifying glass), usually white, and have little claws on them. Multiple ER/UC visits in the last 6 months have found no parasites. A provider treated presumptively for scabies in January, but clearly stated no signs of scabies were seen on exam. Interestingly, [name] says the permethrin helped and would like refill. He saw [name] a couple months ago but did not have a pleasant interaction with the provider - apparently they did a skin scraping which was unrevealing. We will try to obtain records.   Hx IVDA - Opioids (fentanyl, heroin, oral opioids). Has been with [facility] in [place] since Nov 2023, relapse of IV opioid Feb 2024 but has not used since. Switching clinic to [facility] location, still [employer].   He clarifies that there is no associated pain or itching - he is bothered by the sensation of movement in and on the body.   Treated last visit by me for proctitis/hemorrhoids with Anusol which patient says he went through very quickly and had to get OTC 1% cream afterwards - the steroid has not much helped. He is habitually showering 2-3 times per day, says "I see brown bugs coming from my butt in the shower".    Medication list has been reviewed and updated.  Current Meds  Medication Sig    hydrocortisone (ANUSOL-HC) 2.5 % rectal cream Place 1 Application rectally 2 (two) times daily.     Review of Systems  Constitutional:  Negative for fatigue and fever.  Respiratory:  Negative for chest tightness and shortness of breath.   Cardiovascular:  Negative for chest pain and palpitations.  Gastrointestinal:  Negative for abdominal pain.  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  History reviewed. No pertinent surgical history.  Social History   Tobacco Use   Smoking status: Never   Smokeless tobacco: Never  Vaping Use   Vaping Use: Some days   Devices: once a week  Substance Use Topics   Alcohol use: No   Drug use: Not Currently    Types: IV    Comment: Fentanyl    Family History  Problem Relation Age of Onset   Hypertension Mother    Diabetes Mother         04/29/2023  4:21 PM 04/19/2023    9:48 AM  GAD 7 : Generalized Anxiety Score  Nervous, Anxious, on Edge 0 0  Control/stop worrying 0 0  Worry too much - different things 1 1  Trouble relaxing 1 1  Restless 0 0  Easily annoyed or irritable 0 0  Afraid - awful might happen 0 0  Total GAD 7 Score 2 2  Anxiety Difficulty Somewhat difficult Not difficult at all       04/29/2023    4:20 PM 04/19/2023    9:48 AM  Depression screen PHQ 2/9  Decreased Interest 2 1  Down, Depressed, Hopeless 1 0  PHQ - 2 Score 3 1  Altered sleeping 2 1  Tired, decreased energy 2 1  Change in appetite 0 0  Feeling bad or  failure about yourself  1 1  Trouble concentrating 0 0  Moving slowly or fidgety/restless 0 0  Suicidal thoughts 0 0  PHQ-9 Score 8 4  Difficult doing work/chores Not difficult at all Not difficult at all    BP Readings from Last 3 Encounters:  04/29/23 108/74  04/19/23 128/80  04/19/23 124/70    Wt Readings from Last 3 Encounters:  04/29/23 219 lb (99.3 kg)  04/19/23 220 lb (99.8 kg)  04/19/23 220 lb (99.8 kg)    BP 108/74   Pulse 79   Temp 97.6 F (36.4 C) (Oral)   Ht 6\' 1"  (1.854 m)   Wt 219 lb (99.3 kg)   SpO2 97%   BMI 28.89 kg/m   Physical Exam Vitals and nursing note reviewed.  Constitutional:      Appearance: Normal appearance.  HENT:     Right Ear: Tympanic membrane and ear canal normal.     Left Ear: Tympanic membrane and ear canal normal.     Nose: Nose normal.  Pulmonary:     Effort: Pulmonary effort is normal.  Abdominal:     General: There is no distension.  Skin:    General: Skin is warm and dry.     Comments: Numerous large scars of the majority of exposed skin including all 4 extremities, face, scalp, and neck. There are no skin tracts/burrows appreciated anywhere on the skin. Fingernails are better from last time but still elongated and in need of further clipping/filing. There is whitish skin debris under most of the nail edges.   Neurological:     Mental Status: He is alert and oriented to person, place, and time.     Gait: Gait is intact.  Psychiatric:        Mood and Affect: Mood and affect normal.        Thought Content: Thought content is delusional (suspect delusions of parasitosis).    Recent Labs     Component Value Date/Time   NA 140 12/28/2022 1904   NA 138 09/15/2021 0000   K 4.3 12/28/2022 1904   CL 103 12/28/2022 1904   CO2 29 12/28/2022 1904   GLUCOSE 85 12/28/2022 1904   BUN 9 12/28/2022 1904   BUN 5 09/15/2021 0000   CREATININE 1.05 12/28/2022 1904   CALCIUM 9.1 12/28/2022 1904   PROT 7.8 12/28/2022 1904   ALBUMIN  3.9 12/28/2022 1904   AST 16 12/28/2022 1904   ALT 9 12/28/2022 1904   ALKPHOS 77 12/28/2022 1904   BILITOT 0.4 12/28/2022 1904   GFRNONAA >60 12/28/2022 1904   GFRAA >60 03/13/2019 2319    Lab Results  Component Value Date   WBC  11.3 (H) 12/28/2022   HGB 13.1 12/28/2022   HCT 41.4 12/28/2022   MCV 93.5 12/28/2022   PLT 324 12/28/2022   Lab Results  Component Value Date   HGBA1C 5.9 (H) 02/05/2019   No results found for: "CHOL", "HDL", "LDLCALC", "LDLDIRECT", "TRIG", "CHOLHDL" Lab Results  Component Value Date   TSH 1.435 03/13/2019     Assessment and Plan:  1. Psychogenic formication Again informed patient that we have not found any evidence of any bugs or parasites, nor has anyone else who has evaluated him. We discussed the possibility that the crawling sensation he feels, while entirely valid, may simply be a side effect of long-term illicit and prescription opioid use, currently on methadone. His presentation is classic for this.   Offered pharmacotherapy which may help (likely would need risperidone, aripiprazole, or similar) , but patient declines.  I do not think permethrin would be appropriate at this time. Will try to get derm records. Advised to bring in whatever sample he has collected for my review.   2. Excoriation (skin-picking) disorder Plan as above. Again emphasized nail hygiene to minimize damage to skin and lower risk of superficial infections.    Return if symptoms worsen or fail to improve.   Partially dictated using Animal nutritionist. Any errors are unintentional.  Alvester Morin, PA-C, DMSc, Nutritionist [facility] [phone]

## 2023-04-29 NOTE — Telephone Encounter (Signed)
Called pt as a reminder to get labs done. Mailbox is full could not leave VM.  KP

## 2023-05-19 ENCOUNTER — Encounter: Payer: Self-pay | Admitting: Physician Assistant

## 2023-05-19 ENCOUNTER — Ambulatory Visit: Payer: Medicare HMO | Admitting: Physician Assistant

## 2023-05-19 VITALS — BP 128/68 | HR 78 | Ht 72.0 in | Wt 221.0 lb

## 2023-05-19 DIAGNOSIS — Z113 Encounter for screening for infections with a predominantly sexual mode of transmission: Secondary | ICD-10-CM | POA: Diagnosis not present

## 2023-05-19 DIAGNOSIS — R202 Paresthesia of skin: Secondary | ICD-10-CM | POA: Diagnosis not present

## 2023-05-19 DIAGNOSIS — R300 Dysuria: Secondary | ICD-10-CM | POA: Diagnosis not present

## 2023-05-19 DIAGNOSIS — K6289 Other specified diseases of anus and rectum: Secondary | ICD-10-CM

## 2023-05-19 DIAGNOSIS — F54 Psychological and behavioral factors associated with disorders or diseases classified elsewhere: Secondary | ICD-10-CM | POA: Diagnosis not present

## 2023-05-19 DIAGNOSIS — F424 Excoriation (skin-picking) disorder: Secondary | ICD-10-CM | POA: Diagnosis not present

## 2023-05-19 LAB — POCT URINALYSIS DIPSTICK
Bilirubin, UA: NEGATIVE
Blood, UA: NEGATIVE
Glucose, UA: NEGATIVE
Ketones, UA: NEGATIVE
Leukocytes, UA: NEGATIVE
Nitrite, UA: NEGATIVE
Protein, UA: NEGATIVE
Spec Grav, UA: 1.01 (ref 1.010–1.025)
Urobilinogen, UA: 0.2 E.U./dL
pH, UA: 6 (ref 5.0–8.0)

## 2023-05-19 MED ORDER — VENLAFAXINE HCL ER 75 MG PO CP24: 75.0000 mg | ORAL_CAPSULE | Freq: Every day | ORAL | 1 refills | Status: DC

## 2023-05-19 MED ORDER — BACITRACIN-POLYMYXIN B 500-10000 UNIT/GM EX OINT: TOPICAL_OINTMENT | Freq: Two times a day (BID) | CUTANEOUS | 1 refills | Status: DC

## 2023-05-19 MED ORDER — HYDROXYZINE HCL 50 MG PO TABS: 50.0000 mg | ORAL_TABLET | Freq: Three times a day (TID) | ORAL | 1 refills | Status: AC | PRN

## 2023-05-19 NOTE — Progress Notes (Signed)
Date:  05/19/2023   Name:  [name]   DOB:  1980-12-30   MRN:  [id number]   Chief Complaint: Excoriation (Pt states it is worst, itching more)  HPI [name] presents for 3-week follow-up on suspected psychogenic formication/delusions of parasitosis.  Excoriation disorder is not well-controlled, and he is doing more damage to the skin.  Unfortunately his symptoms are worse from last visit.  He is losing sleep due to sensations of insects crawling on the skin.  Still believes insects are coming from his ears, nose, anus, penis, and burrowing under the skin.  At my request, he brought me a small box today filled with Ziploc bags of "samples" from various sources; I carefully and closely reviewed each of the samples only to find nothing but skin flakes and dried blood.  Complains of a painful cyst of the perineum.  Still no itching, just formication.  He has been using hydrocortisone cream and Desitin in the intergluteal area and perineum.  Also complains of dysuria, convinced his ex-girlfriend "gave me something"  (PRIOR NOTE 04/29/23): [name] presents for 10-day follow-up on what I believe to be psychogenic formication vs delusion of parasitosis. He endorses mites/scabies/bugs in the skin of his scalp, crawling in his ears, nostrils, fingernails, and rectum. States when he picks at the skin, all of a sudden it gets hard as if the bugs are trying not to get caught. He reports actually picking some of them off and collecting them in a plastic bag which he forgot for the past two visits. He says they are incredibly small (visible only with magnifying glass), usually white, and have little claws on them. Multiple ER/UC visits in the last 6 months have found no parasites. A provider treated presumptively for scabies in January, but clearly stated no signs of scabies were seen on exam. Interestingly, [name] says the permethrin helped and would like refill. He saw [facility] a couple months ago but did  not have a pleasant interaction with the provider - apparently they did a skin scraping which was unrevealing. We will try to obtain records.  Hx IVDA - Opioids (fentanyl, heroin, oral opioids). Has been with [facility] in [place] since Nov 2023, relapse of IV opioid Feb 2024 but has not used since. Switching clinic to [facility] location, still [employer].  He clarifies that there is no associated pain or itching - he is bothered by the sensation of movement in and on the body.  Treated last visit by me for proctitis/hemorrhoids with Anusol which patient says he went through very quickly and had to get OTC 1% cream afterwards - the steroid has not much helped. He is habitually showering 2-3 times per day, says "I see brown bugs coming from my butt in the shower".    Medication list has been reviewed and updated.  Current Meds  Medication Sig   bacitracin-polymyxin b (POLYSPORIN) ointment Apply topically 2 (two) times daily.   hydrOXYzine (ATARAX) 50 MG tablet Take 1 tablet (50 mg total) by mouth 3 (three) times daily as needed.   venlafaxine XR (EFFEXOR XR) 75 MG 24 hr capsule Take 1 capsule (75 mg total) by mouth daily with breakfast.     Review of Systems  Constitutional:  Negative for fatigue and fever.  Respiratory:  Negative for chest tightness and shortness of breath.   Cardiovascular:  Negative for chest pain and palpitations.  Gastrointestinal:  Negative for abdominal pain.  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  History reviewed. No pertinent surgical history.  Social History   Tobacco Use   Smoking status: Never   Smokeless tobacco: Never  Vaping Use   Vaping Use: Some days   Devices: once a week  Substance Use Topics   Alcohol use: No   Drug use: Not Currently    Types: IV    Comment: Fentanyl    Family History  Problem Relation Age of Onset   Hypertension Mother    Diabetes Mother         04/29/2023    4:21 PM 04/19/2023    9:48 AM  GAD 7 : Generalized Anxiety Score  Nervous, Anxious, on Edge 0 0  Control/stop worrying 0 0  Worry too much - different things 1 1  Trouble relaxing 1 1  Restless 0 0  Easily annoyed or irritable 0 0  Afraid - awful might happen 0 0  Total GAD 7 Score 2 2  Anxiety Difficulty Somewhat difficult Not difficult at all       04/29/2023    4:20 PM 04/19/2023    9:48 AM  Depression screen PHQ 2/9  Decreased Interest 2 1  Down, Depressed, Hopeless 1 0  PHQ - 2 Score 3 1  Altered sleeping 2 1  Tired, decreased energy 2 1  Change in appetite 0 0  Feeling bad or failure about yourself  1 1  Trouble concentrating 0 0  Moving slowly or fidgety/restless 0 0  Suicidal thoughts 0 0  PHQ-9 Score 8 4  Difficult doing work/chores Not difficult at all Not difficult at all    BP Readings from Last 3 Encounters:  05/19/23 128/68  04/29/23 108/74  04/19/23 128/80    Wt Readings from Last 3 Encounters:  05/19/23 221 lb (100.2 kg)  04/29/23 219 lb (99.3 kg)  04/19/23 220 lb (99.8 kg)    BP 128/68   Pulse 78   Ht 6' (1.829 m)   Wt 221 lb (100.2 kg)   SpO2 98%   BMI 29.97 kg/m   Physical Exam Vitals and nursing note reviewed.  Constitutional:      Appearance: Normal appearance.  HENT:     Right Ear: Tympanic membrane and ear canal normal.     Left Ear: Tympanic membrane and ear canal normal.     Nose: Nose normal.  Pulmonary:     Effort: Pulmonary effort is normal.  Abdominal:      General: There is no distension.  Genitourinary:    Comments: Intergluteal fold seems pale, possibly atrophied. Nontender skin tags of anus. 5 mm mildly tender cyst of perineum without fluctuance or discharge.  Skin:    General: Skin is warm and dry.     Comments: Numerous large scars of the majority of exposed skin including all 4 extremities, face, scalp, and neck. There are no skin tracts/burrows appreciated anywhere on the skin. Fingernails are better from last time but still elongated and in need of further clipping/filing. There is  whitish skin debris under most of the nail edges.   Neurological:     Mental Status: He is alert and oriented to person, place, and time.     Gait: Gait is intact.  Psychiatric:        Mood and Affect: Mood and affect normal.        Thought Content: Thought content is delusional (suspect delusions of parasitosis).       Recent Labs     Component Value Date/Time   NA 140 12/28/2022 1904   NA 138 09/15/2021 0000   K 4.3 12/28/2022 1904   CL 103 12/28/2022 1904   CO2 29 12/28/2022 1904   GLUCOSE 85 12/28/2022 1904   BUN 9 12/28/2022 1904   BUN 5 09/15/2021 0000   CREATININE 1.05 12/28/2022 1904   CALCIUM 9.1 12/28/2022 1904   PROT 7.8 12/28/2022 1904   ALBUMIN 3.9 12/28/2022 1904   AST 16 12/28/2022 1904   ALT 9 12/28/2022 1904   ALKPHOS 77 12/28/2022 1904   BILITOT 0.4 12/28/2022 1904   GFRNONAA >60 12/28/2022 1904   GFRAA >60 03/13/2019 2319    Lab Results  Component Value Date   WBC 11.3 (H) 12/28/2022   HGB 13.1 12/28/2022   HCT 41.4 12/28/2022   MCV 93.5 12/28/2022   PLT 324 12/28/2022   Lab Results  Component Value Date   HGBA1C 5.9 (H) 02/05/2019   No results found for: "CHOL", "HDL", "LDLCALC", "LDLDIRECT", "TRIG", "CHOLHDL" Lab Results  Component Value Date   TSH 1.435 03/13/2019     Assessment and Plan:  1. Psychogenic formication Again reviewed with patient no evidence of any parasites or insects that I can tell.   No other providers have found anything either.  It is also very unlikely that he would have any type of infestation affecting so many parts of the body (ears, skin, penis, GI tract).    Will begin treatment with venlafaxine daily as below and hydroxyzine up to 3 times daily as needed.  At the very least, the hydroxyzine should help him sleep.  Might try risperidone or aripiprazole if ineffective.  Referring to psychiatry for further evaluation and management  - hydrOXYzine (ATARAX) 50 MG tablet; Take 1 tablet (50 mg total) by mouth 3 (three) times daily as needed.  Dispense: 90 tablet; Refill: 1 - venlafaxine XR (EFFEXOR XR) 75 MG 24 hr capsule; Take 1 capsule (75 mg total) by mouth daily with breakfast.  Dispense: 30 capsule; Refill: 1 - Ambulatory referral to Psychiatry  2. Excoriation (skin-picking) disorder Plan as above - hydrOXYzine (ATARAX) 50 MG tablet; Take 1 tablet (50 mg total) by mouth 3 (three) times daily as needed.  Dispense: 90 tablet; Refill: 1 - venlafaxine XR (EFFEXOR XR) 75 MG 24 hr capsule; Take 1 capsule (75 mg total) by mouth daily with breakfast.  Dispense: 30 capsule; Refill: 1 - bacitracin-polymyxin b (POLYSPORIN) ointment; Apply topically 2 (two) times daily.  Dispense: 15 g; Refill: 1 - Ambulatory referral to Psychiatry  3. Screen for STD (sexually transmitted disease) Dysuria with negative urine dipstick today.  Checking for GC and chlamydia as well - GC/Chlamydia Probe Amp([facility]) - POCT urinalysis dipstick  4. Dysuria Dysuria with negative urine dipstick today.  Checking for GC and chlamydia as well - GC/Chlamydia Probe Amp([facility]) - POCT urinalysis dipstick  5. Rectal discomfort Exam today shows possible skin atrophy of the intergluteal fold, likely from continued topical steroid use.  Possible skin tags/excess tissue of the anus.  Referring  to GI for consult and possibly anoscopy - Ambulatory referral to Gastroenterology       Return in about 4  weeks (around 06/16/2023) for OV excoriation/formication.   Partially dictated using Animal nutritionist. Any errors are unintentional.  Alvester Morin, PA-C, DMSc, Nutritionist [facility] [phone]

## 2023-05-31 ENCOUNTER — Other Ambulatory Visit: Payer: Self-pay | Admitting: Physician Assistant

## 2023-05-31 DIAGNOSIS — K6289 Other specified diseases of anus and rectum: Secondary | ICD-10-CM

## 2023-06-02 NOTE — Telephone Encounter (Signed)
Unable to refill per protocol, Rx expired. Discontinued 05/19/23.  Requested Prescriptions  Pending Prescriptions Disp Refills   hydrocortisone (ANUSOL-HC) 2.5 % rectal cream [Pharmacy Med Name: HYDROCORTISONE (PERIANAL) 2.5% TOP] 30 g 2    Sig: PLACE 1 APPLICATION RECTALLY TWICE DAILY     Not Delegated - Over the Counter: OTC 2 Failed - 05/31/2023  5:18 PM      Failed - This refill cannot be delegated      Passed - Valid encounter within last 12 months    Recent Outpatient Visits           2 weeks ago Psychogenic formication   [facility], [place], [place]   1 month ago Psychogenic formication   [facility] at [facility], [place], [place]   1 month ago Encounter to establish care   [facility] at [facility], [place], [place]       Future Appointments             In 2 weeks Mordecai Maes, [place] Alar, PA [facility] at [facility]

## 2023-06-16 ENCOUNTER — Ambulatory Visit: Payer: Medicare HMO | Admitting: Physician Assistant

## 2023-06-17 ENCOUNTER — Ambulatory Visit: Payer: Medicare HMO | Admitting: Physician Assistant

## 2023-06-17 ENCOUNTER — Encounter: Payer: Self-pay | Admitting: Physician Assistant

## 2023-06-17 ENCOUNTER — Ambulatory Visit (INDEPENDENT_AMBULATORY_CARE_PROVIDER_SITE_OTHER): Payer: Medicare HMO | Admitting: Physician Assistant

## 2023-06-17 VITALS — BP 110/76 | HR 78 | Temp 97.8°F | Ht 72.0 in | Wt 212.0 lb

## 2023-06-17 DIAGNOSIS — B372 Candidiasis of skin and nail: Secondary | ICD-10-CM | POA: Diagnosis not present

## 2023-06-17 DIAGNOSIS — F424 Excoriation (skin-picking) disorder: Secondary | ICD-10-CM

## 2023-06-17 DIAGNOSIS — R202 Paresthesia of skin: Secondary | ICD-10-CM | POA: Diagnosis not present

## 2023-06-17 DIAGNOSIS — F54 Psychological and behavioral factors associated with disorders or diseases classified elsewhere: Secondary | ICD-10-CM

## 2023-06-17 MED ORDER — KETOCONAZOLE 2 % EX CREA: 1.0000 | TOPICAL_CREAM | Freq: Every day | CUTANEOUS | 0 refills | Status: DC

## 2023-06-17 MED ORDER — RISPERIDONE 1 MG PO TABS: ORAL_TABLET | ORAL | 0 refills | Status: DC

## 2023-06-17 NOTE — Progress Notes (Signed)
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  He says neither of these medication worked so he has discontinued.  I also referred to behavioral health, looks like he will be going to [facility] 07/14/23.  I also suggested he return to dermatology if desired.    Last visit we discovered perennial/intergluteal skin atrophy likely from excess use of corticosteroid and continued friction from scratching and excess cleaning - he was referred to GI for consideration of anoscopy but they were not able to reach him to schedule.  He reports continued perianal irritation, aggressively cleaning in the shower 3-4 times every day, applying alcohol, tea tree oil, other topicals to the intergluteal and perianal region.  He ran out of cortisone cream, which I think he should discontinue anyways given the atrophy, but is still applying Desitin.  He has completely shaved the area using a razor because he says "what ever this thing is, it likes hair." He says he has collected "worms" on a damp paper towel but was not able to bring them today; he will send me a picture through MyChart.    He complains of the "soft spots" on his face and scalp, but these are simply excoriations and scabs in various stages of healing.  He tells me the skin should not be so easy to break using just his nails.     Medication list has been reviewed and updated.  Current Meds  Medication Sig   bacitracin-polymyxin b (POLYSPORIN) ointment Apply topically 2 (two) times daily.   hydrocortisone (ANUSOL-HC) 2.5 % rectal cream Apply topically.     Review of Systems  Constitutional:  Negative for fatigue and fever.  Respiratory:  Negative for chest tightness and shortness of breath.    Cardiovascular:  Negative for chest pain and palpitations.  Gastrointestinal:  Negative for abdominal pain.  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  No past surgical history on file.  Social History   Tobacco Use   Smoking status: Never   Smokeless tobacco: Never  Vaping Use   Vaping status: Some Days   Devices: once a week  Substance Use Topics   Alcohol use: No   Drug use: Not Currently    Types: IV    Comment: Fentanyl    Family History  Problem Relation Age of Onset   Hypertension Mother    Diabetes Mother         06/17/2023   11:30 AM 04/29/2023    4:21 PM 04/19/2023    9:48 AM  GAD 7 : Generalized Anxiety Score  Nervous, Anxious, on Edge 0 0 0  Control/stop worrying 0 0 0  Worry too much - different things 0 1 1  Trouble relaxing 0 1 1  Restless  0 0 0  Easily annoyed or irritable 0 0 0  Afraid - awful might happen 0 0 0  Total GAD 7 Score 0 2 2  Anxiety Difficulty Not difficult at all Somewhat difficult Not difficult at all       06/17/2023   11:30 AM 04/29/2023    4:20 PM 04/19/2023    9:48 AM  Depression screen PHQ 2/9  Decreased Interest 0 2 1  Down, Depressed, Hopeless 0 1 0  PHQ - 2 Score 0 3 1  Altered sleeping 1 2 1   Tired, decreased energy 1 2 1   Change in appetite 1 0 0  Feeling bad or failure about yourself  0 1 1  Trouble concentrating 0 0 0  Moving slowly or fidgety/restless 0 0 0  Suicidal thoughts 0 0 0  PHQ-9 Score 3 8 4   Difficult doing  work/chores Not difficult at all Not difficult at all Not difficult at all    BP Readings from Last 3 Encounters:  06/17/23 110/76  05/19/23 128/68  04/29/23 108/74    Wt Readings from Last 3 Encounters:  06/17/23 212 lb (96.2 kg)  05/19/23 221 lb (100.2 kg)  04/29/23 219 lb (99.3 kg)    BP 110/76   Pulse 78   Temp 97.8 F (36.6 C) (Oral)   Ht 6' (1.829 m)   Wt 212 lb (96.2 kg)   SpO2 98%   BMI 28.75 kg/m   Physical Exam Vitals and nursing note reviewed.  Constitutional:      Appearance: Normal appearance.  HENT:     Right Ear: Tympanic membrane and ear canal normal.     Left Ear: Tympanic membrane and ear canal normal.     Nose: Nose normal.  Cardiovascular:     Rate and Rhythm: Normal rate.  Pulmonary:     Effort: Pulmonary effort is normal.  Abdominal:     General: There is no distension.  Genitourinary:    Comments: Intergluteal fold seems pale, atrophied, candidal infection suspected. Nontender skin tags of anus.  Musculoskeletal:        General: Normal range of motion.  Skin:    General: Skin is warm and dry.     Comments: Numerous large scars of the majority of exposed skin including all 4 extremities, face, scalp, and neck. There are no skin tracts/burrows appreciated anywhere on the skin. Fingernails are better from last time but still elongated and in need of further clipping/filing. There is whitish skin debris under most of the nail edges.   Neurological:     Mental Status: He is alert and oriented to person, place, and time.     Gait: Gait is intact.  Psychiatric:        Mood and Affect: Mood and affect normal.        Thought Content: Thought content is delusional (suspect delusions of parasitosis).     Recent Labs     Component Value Date/Time   NA 140 12/28/2022 1904   NA 138 09/15/2021 0000   K 4.3 12/28/2022 1904   CL 103 12/28/2022 1904   CO2 29 12/28/2022 1904   GLUCOSE 85 12/28/2022 1904   BUN 9 12/28/2022 1904   BUN 5 09/15/2021  0000   CREATININE 1.05 12/28/2022 1904   CALCIUM 9.1 12/28/2022 1904   PROT 7.8 12/28/2022 1904   ALBUMIN 3.9 12/28/2022 1904   AST 16 12/28/2022 1904   ALT 9 12/28/2022 1904   ALKPHOS 77 12/28/2022 1904  BILITOT 0.4 12/28/2022 1904   GFRNONAA >60 12/28/2022 1904   GFRAA >60 03/13/2019 2319    Lab Results  Component Value Date   WBC 11.3 (H) 12/28/2022   HGB 13.1 12/28/2022   HCT 41.4 12/28/2022   MCV 93.5 12/28/2022   PLT 324 12/28/2022   Lab Results  Component Value Date   HGBA1C 5.9 (H) 02/05/2019   No results found for: "CHOL", "HDL", "LDLCALC", "LDLDIRECT", "TRIG", "CHOLHDL" Lab Results  Component Value Date   TSH 1.435 03/13/2019     Assessment and Plan:  1. Psychogenic formication Reviewed the diagnosis with [name].  He is willing to try Risperdal.  Set up with psych for 07/14/2023 which is reassuring.  Stop venlafaxine.  Use hydroxyzine as needed.   Discussed with patient he can try the nocturnal tape test for 1-2 nights, though I do not expect he will have any results with this.  - risperiDONE (RISPERDAL) 1 MG tablet; Take 0.5 tablets (0.5 mg total) by mouth at bedtime for 7 days, THEN 1 tablet (1 mg total) at bedtime for 7 days, THEN 1 tablet (1 mg total) 2 (two) times daily for 14 days.  Dispense: 40 tablet; Refill: 0  2. Excoriation (skin-picking) disorder Reviewed the diagnosis with [name].  He is willing to try Risperdal.  Set up with psych for 07/14/2023 which is reassuring.  Stop venlafaxine.  Use hydroxyzine as needed - risperiDONE (RISPERDAL) 1 MG tablet; Take 0.5 tablets (0.5 mg total) by mouth at bedtime for 7 days, THEN 1 tablet (1 mg total) at bedtime for 7 days, THEN 1 tablet (1 mg total) 2 (two) times daily for 14 days.  Dispense: 40 tablet; Refill: 0  3. Candidal intertrigo Use ketoconazole cream as below - ketoconazole (NIZORAL) 2 % cream; Apply 1 Application topically daily. Use a thin layer on the affected skin surrounding the anus. Do not use  longer than 4 consecutive weeks  Dispense: 30 g; Refill: 0   F/u PRN. Will allow patient to see specialists.    Alvester Morin, PA-C, DMSc, Nutritionist [facility] Primary Care and Sports Medicine [facility] [phone]

## 2023-07-08 ENCOUNTER — Emergency Department: Payer: Medicare HMO

## 2023-07-08 ENCOUNTER — Other Ambulatory Visit: Payer: Self-pay

## 2023-07-08 ENCOUNTER — Emergency Department
Admission: EM | Admit: 2023-07-08 | Discharge: 2023-07-08 | Disposition: A | Discharge status: Home / Self Care | Payer: Medicare HMO | Attending: Emergency Medicine | Admitting: Emergency Medicine

## 2023-07-08 DIAGNOSIS — M79605 Pain in left leg: Secondary | ICD-10-CM | POA: Diagnosis not present

## 2023-07-08 DIAGNOSIS — M79604 Pain in right leg: Secondary | ICD-10-CM | POA: Diagnosis not present

## 2023-07-08 DIAGNOSIS — M25531 Pain in right wrist: Secondary | ICD-10-CM | POA: Diagnosis not present

## 2023-07-08 DIAGNOSIS — Y92410 Unspecified street and highway as the place of occurrence of the external cause: Secondary | ICD-10-CM | POA: Diagnosis not present

## 2023-07-08 DIAGNOSIS — M79661 Pain in right lower leg: Secondary | ICD-10-CM | POA: Diagnosis not present

## 2023-07-08 DIAGNOSIS — M79662 Pain in left lower leg: Secondary | ICD-10-CM | POA: Diagnosis not present

## 2023-07-08 LAB — CBC
HCT: 38.3 % — ABNORMAL LOW (ref 39.0–52.0)
Hemoglobin: 12.1 g/dL — ABNORMAL LOW (ref 13.0–17.0)
MCH: 29.6 pg (ref 26.0–34.0)
MCHC: 31.6 g/dL (ref 30.0–36.0)
MCV: 93.6 fL (ref 80.0–100.0)
Platelets: 361 10*3/uL (ref 150–400)
RBC: 4.09 MIL/uL — ABNORMAL LOW (ref 4.22–5.81)
RDW: 13.7 % (ref 11.5–15.5)
WBC: 9.6 10*3/uL (ref 4.0–10.5)
nRBC: 0 % (ref 0.0–0.2)

## 2023-07-08 LAB — BASIC METABOLIC PANEL
Anion gap: 6 (ref 5–15)
BUN: 8 mg/dL (ref 6–20)
CO2: 27 mmol/L (ref 22–32)
Calcium: 8.6 mg/dL — ABNORMAL LOW (ref 8.9–10.3)
Chloride: 104 mmol/L (ref 98–111)
Creatinine, Ser: 1.07 mg/dL (ref 0.61–1.24)
GFR, Estimated: >60 mL/min (ref >60)
Glucose, Bld: 117 mg/dL — ABNORMAL HIGH (ref 70–99)
Potassium: 3.8 mmol/L (ref 3.5–5.1)
Sodium: 137 mmol/L (ref 135–145)
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
## 2023-07-08 NOTE — ED Notes (Signed)
Multiple RN attempt blood draw, unsuccessful
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
## 2023-07-08 NOTE — ED Notes (Signed)
X-ray at bedside

## 2023-07-08 NOTE — ED Notes (Signed)
Pts legs wrapped with non-stick abd pads and Cur lex.
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
## 2023-08-02 NOTE — Progress Notes (Unsigned)
Celso Amy, PA-C [address]  [address]  [place], [place] [address]  Main: [phone]  Fax: [phone]   Gastroenterology Consultation  Referring Provider:     Remo Lipps, PA Primary Care Physician:  Remo Lipps, PA Primary Gastroenterologist:  Celso Amy, PA-C / Dr. Wyline Mood   Reason for Consultation:     Rectal Itching / Irritation        HPI:   [name] is a 42 y.o. y/o male referred for consultation & management  by Remo Lipps, PA.  Evaluate rectal itching and peri-anal irritation.  No previous GI evaluation.  He has history of psychogenic formication/excoriation disorder.  Hx IVDA - Opioids (fentanyl, heroin, oral opioids). Has been with [facility] in [place] since Nov 2023, relapse of IV opioid Feb 2024 but has not used since. Switching clinic to [facility] location.  Rectal Symptoms: He reports continued perianal irritation and itching, aggressively cleaning in the shower 3-4 times every day, applying alcohol, tea tree oil, other topicals to the intergluteal and perianal region.  He ran out of cortisone cream and discontinued. He has completely shaved the area using a razor because he says "what ever this thing is, it likes hair." He says he has collected "worms" on a damp paper towel but was not able to bring them today.  He has seen [facility] in the past.    GI symptoms: he admits to constipation.  Has hard bowel movement twice per week.  Tried MiraLAX which did not help.  No current treatment.  Has noticed mild intermittent bright red rectal bleeding after hard bowel movement.  No previous colonoscopy or GI evaluation.  He denies any upper GI symptoms.  Past Medical History:  Diagnosis Date   Allergy    Opioid abuse (HCC) 03/2019   on-going daily use of IV Fentanyl    No past surgical history on file.  Prior to Admission medications   Medication Sig Start Date End Date Taking? Authorizing Provider   bacitracin-polymyxin b (POLYSPORIN) ointment Apply topically 2 (two) times daily. 05/19/23   Remo Lipps, PA  hydrocortisone (ANUSOL-HC) 2.5 % rectal cream Apply topically. 05/31/23   [provider]  ketoconazole (NIZORAL) 2 % cream Apply 1 Application topically daily. Use a thin layer on the affected skin surrounding the anus. Do not use longer than 4 consecutive weeks 06/17/23   Remo Lipps, PA  risperiDONE (RISPERDAL) 1 MG tablet Take 0.5 tablets (0.5 mg total) by mouth at bedtime for 7 days, THEN 1 tablet (1 mg total) at bedtime for 7 days, THEN 1 tablet (1 mg total) 2 (two) times daily for 14 days. 06/17/23 07/15/23  Remo Lipps, PA  venlafaxine XR (EFFEXOR XR) 75 MG 24 hr capsule Take 1 capsule (75 mg total) by mouth daily with breakfast. Patient not taking: Reported on 06/17/2023 05/19/23   Remo Lipps, PA    Family History  Problem Relation Age of Onset   Hypertension Mother    Diabetes Mother      Social History   Tobacco Use   Smoking status: Never   Smokeless tobacco: Never  Vaping Use   Vaping status: Some Days   Devices: once a week  Substance Use Topics   Alcohol use: No   Drug use: Yes    Types: IV    Comment: Fentanyl    Allergies as of 08/03/2023   (No Known Allergies)    Review of  Systems:    All systems reviewed and negative except where noted in HPI.   Physical Exam:  There were no vitals taken for this visit. No LMP for male patient.  General:   Alert,  Well-developed, well-nourished, pleasant and cooperative in NAD Lungs:  Respirations even and unlabored.  Clear throughout to auscultation.   No wheezes, crackles, or rhonchi. No acute distress. Heart:  Regular rate and rhythm; no murmurs, clicks, rubs, or gallops. Abdomen:  Normal bowel sounds.  No bruits.  Soft, and non-distended without masses, hepatosplenomegaly or hernias noted.  No Tenderness.  No guarding or rebound tenderness.   Rectal: No external hemorrhoids.  There is  white skin discoloration surrounding the anus and up into the gluteal cleft.  Possible perianal candidiasis.  No rectal masses or tenderness on digital rectal exam. Anoscopy: Showed large internal hemorrhoid which is not bleeding, not prolapsed. Neurologic:  Alert and oriented x3;  grossly normal neurologically. Psych:  Alert and cooperative. Anxious mood and affect. Skin: Multiple excoriated skin wounds all over his body.  Multiple Band-Aids on his lower legs.  Imaging Studies: See HPI  Assessment and Plan:   [name] is a 42 y.o. y/o male has been referred for Perianal itching and Dermatitis.  Hx chronic drug use, and psychogenic formication/excoriation disorder.  He has chronic constipation and mild rectal bleeding.  Rectal exam today showed no external hemorrhoids.  There is evidence of skin discoloration surrounding the rectum and up in the gluteal cleft.  Possible perianal candidiasis.  Anoscope exam showed large internal hemorrhoid which is not bleeding.  No rectal masses or tenderness.  Follow up   Rectal Bleeding Scheduling Colonoscopy I discussed risks of colonoscopy with patient to include risk of bleeding, colon perforation, and risk of sedation.  Patient expressed understanding and agrees to proceed with colonoscopy.   Rectal Itching  Possibly psychogenic  Peri-anal dermatitis - Suspect Candida  Rx: Nystatin Cream  Internal Hemorrhoids  Rx Hydrocortisone Suppository 25mg  1 into rectum at bedtime x 12 days.  Abnormal findings in stool - Pt. Is worried about seeing worms.  Stool Test: O&P  6.  Constipation  Rx Amitiza BID  High-fiber diet, 64 ounces of fluids daily.  Celso Amy, PA-C

## 2023-08-03 ENCOUNTER — Ambulatory Visit (INDEPENDENT_AMBULATORY_CARE_PROVIDER_SITE_OTHER): Payer: Medicare HMO | Admitting: Physician Assistant

## 2023-08-03 ENCOUNTER — Encounter: Payer: Self-pay | Admitting: Physician Assistant

## 2023-08-03 VITALS — BP 142/84 | HR 69 | Temp 98.3°F | Ht 73.0 in | Wt 218.8 lb

## 2023-08-03 DIAGNOSIS — L290 Pruritus ani: Secondary | ICD-10-CM

## 2023-08-03 DIAGNOSIS — R195 Other fecal abnormalities: Secondary | ICD-10-CM

## 2023-08-03 DIAGNOSIS — B3789 Other sites of candidiasis: Secondary | ICD-10-CM

## 2023-08-03 DIAGNOSIS — L308 Other specified dermatitis: Secondary | ICD-10-CM | POA: Diagnosis not present

## 2023-08-03 DIAGNOSIS — K648 Other hemorrhoids: Secondary | ICD-10-CM

## 2023-08-03 DIAGNOSIS — K5900 Constipation, unspecified: Secondary | ICD-10-CM

## 2023-08-03 DIAGNOSIS — K625 Hemorrhage of anus and rectum: Secondary | ICD-10-CM | POA: Diagnosis not present

## 2023-08-03 DIAGNOSIS — K5904 Chronic idiopathic constipation: Secondary | ICD-10-CM

## 2023-08-03 MED ORDER — HYDROCORTISONE ACETATE 25 MG RE SUPP
25.0000 mg | Freq: Every day | RECTAL | 0 refills | Status: AC
Start: 2023-08-03 — End: 2023-08-15

## 2023-08-03 MED ORDER — LUBIPROSTONE 24 MCG PO CAPS
24.0000 ug | ORAL_CAPSULE | Freq: Two times a day (BID) | ORAL | 5 refills | Status: DC
Start: 2023-08-03 — End: 2023-10-27

## 2023-08-03 MED ORDER — NYSTATIN 100000 UNIT/GM EX CREA
1.0000 | TOPICAL_CREAM | Freq: Two times a day (BID) | CUTANEOUS | 0 refills | Status: DC
Start: 2023-08-03 — End: 2023-10-27

## 2023-08-03 MED ORDER — PEG 3350-KCL-NA BICARB-NACL 420 G PO SOLR: 4000.0000 mL | Freq: Once | ORAL | 0 refills | Status: AC

## 2023-09-08 ENCOUNTER — Ambulatory Visit: Admission: RE | Admit: 2023-09-08 | Payer: Medicare HMO | Source: Home / Self Care | Admitting: Gastroenterology

## 2023-09-08 ENCOUNTER — Encounter: Admission: RE | Payer: Self-pay | Source: Home / Self Care

## 2023-09-08 SURGERY — COLONOSCOPY WITH PROPOFOL
Anesthesia: General

## 2023-10-05 ENCOUNTER — Ambulatory Visit: Payer: Medicare PPO | Admitting: Family Medicine

## 2023-10-27 ENCOUNTER — Ambulatory Visit: Payer: Medicare HMO | Admitting: Physician Assistant

## 2023-10-27 ENCOUNTER — Ambulatory Visit (INDEPENDENT_AMBULATORY_CARE_PROVIDER_SITE_OTHER): Payer: Medicare HMO | Admitting: Physician Assistant

## 2023-10-27 ENCOUNTER — Encounter: Payer: Self-pay | Admitting: Physician Assistant

## 2023-10-27 DIAGNOSIS — F424 Excoriation (skin-picking) disorder: Secondary | ICD-10-CM

## 2023-10-27 DIAGNOSIS — F54 Psychological and behavioral factors associated with disorders or diseases classified elsewhere: Secondary | ICD-10-CM | POA: Diagnosis not present

## 2023-10-27 DIAGNOSIS — R202 Paresthesia of skin: Secondary | ICD-10-CM | POA: Diagnosis not present

## 2023-10-27 MED ORDER — VENLAFAXINE HCL ER 75 MG PO CP24
75.0000 mg | ORAL_CAPSULE | Freq: Every day | ORAL | 1 refills | Status: DC
Start: 2023-10-27 — End: 2024-05-09

## 2023-10-27 MED ORDER — DOXYCYCLINE MONOHYDRATE 100 MG PO TABS
100.0000 mg | ORAL_TABLET | Freq: Two times a day (BID) | ORAL | 0 refills | Status: DC
Start: 2023-10-27 — End: 2023-12-06

## 2023-10-27 MED ORDER — ALBENDAZOLE 200 MG PO TABS
400.0000 mg | ORAL_TABLET | Freq: Once | ORAL | 0 refills | Status: DC
Start: 2023-10-30 — End: 2023-11-22

## 2023-10-27 NOTE — Progress Notes (Signed)
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  He has stopped all medications and is no longer going to the methadone clinic.  States he has not used methadone or any other opioids since before our last visit in August.  Still struggling with "parasites".  He has seen me several times for this complaint as well as dermatology and GI, and nobody has discovered the parasites he is describing.  He again brings a collection of various samples in Ziploc bags, all of which are skin, scabs, or fecal material.  He states the bugs are not visible to the naked eye, but yet he tells me he has seen the "tails which expand and contract as they burrow in the skin".   We also treating for excoriation disorder, but he stopped venlafaxine months ago because he did not notice improvement.  We did a trial of Risperdal after our last visit, but he states this did not help either.  Planned to see psychiatry in September, but I do not believe this occurred.  He will be returning to GI on 10/29/2023.  They ordered stool O&P last time but he did not complete his stool collection.  Planning for colonoscopy in January, and patient is convinced that this will reveal the parasites which are "all up in there".    Medication list has been reviewed and updated.  Current Meds  Medication Sig   [START ON 10/30/2023] albendazole (ALBENZA) 200 MG tablet Take 2 tablets (400 mg total) by mouth once for 1 dose. Do not take until after you have collected the stool sample for GI.     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  History reviewed. No pertinent surgical history.  Social History   Tobacco Use   Smoking status: Never   Smokeless tobacco: Never  Vaping Use   Vaping status: Some Days   Devices: once a week  Substance Use Topics   Alcohol use: No   Drug use: Yes    Types: IV    Comment: Fentanyl    Family History  Problem Relation Age of Onset   Hypertension Mother    Diabetes Mother         10/27/2023    3:38 PM 06/17/2023   11:30 AM 04/29/2023    4:21 PM 04/19/2023    9:48 AM  GAD 7 : Generalized Anxiety Score  Nervous, Anxious, on Edge 0 0 0 0  Control/stop worrying 0 0 0 0  Worry too much - different things 0 0 1 1  Trouble relaxing 0 0 1 1  Restless 0 0 0 0  Easily annoyed or irritable 0 0 0 0  Afraid - awful might happen 0 0 0 0  Total GAD 7 Score 0 0 2 2  Anxiety Difficulty Not difficult at all Not difficult at all Somewhat difficult  Not difficult at all       10/27/2023    3:38 PM 06/17/2023   11:30 AM 04/29/2023    4:20 PM  Depression screen PHQ 2/9  Decreased Interest 0 0 2  Down, Depressed, Hopeless 0 0 1  PHQ - 2 Score 0 0 3  Altered sleeping 0 1 2  Tired, decreased energy 0 1 2  Change in appetite 0 1 0  Feeling bad or failure about yourself  0 0 1  Trouble concentrating 0 0 0  Moving slowly or fidgety/restless 0 0 0  Suicidal thoughts 0 0 0  PHQ-9 Score 0 3 8  Difficult doing work/chores Not difficult at all Not difficult at all Not difficult at all    BP Readings from Last 3 Encounters:  10/27/23 128/78  08/03/23 (!) 142/84  07/08/23 139/87    Wt Readings from Last 3 Encounters:  10/27/23 233 lb (105.7 kg)  08/03/23 218 lb 12.8 oz (99.2 kg)  07/08/23 211 lb (95.7 kg)    BP 128/78 (BP Location: Left  Arm, Patient Position: Sitting)   Pulse 74   Temp 98.3 F (36.8 C) (Oral)   Ht 6\' 1"  (1.854 m)   Wt 233 lb (105.7 kg)   SpO2 96%   BMI 30.74 kg/m   Physical Exam Vitals and nursing note reviewed.  Constitutional:      Appearance: Normal appearance.  HENT:     Right Ear: Tympanic membrane and ear canal normal.     Left Ear: Tympanic membrane and ear canal normal.     Nose: Nose normal.  Cardiovascular:     Rate and Rhythm: Normal rate.  Pulmonary:     Effort: Pulmonary effort is normal.  Abdominal:     General: There is no distension.  Musculoskeletal:        General: Normal range of motion.  Skin:    General: Skin is warm and dry.     Comments: Numerous large scars of the majority of exposed skin including all 4 extremities, face, scalp, and neck. There are no skin tracts/burrows appreciated anywhere on the skin. Fingernails are better from last time but still elongated and in need of further clipping/filing. There is whitish skin debris under most of the nail edges.   Neurological:     Mental Status: He is alert and oriented to person, place, and time.     Gait: Gait is intact.  Psychiatric:        Mood and Affect: Mood and affect normal.        Thought Content: Thought content is delusional (suspect delusions of parasitosis).     Recent Labs     Component Value Date/Time   NA 137 07/08/2023 1231   NA 138 09/15/2021 0000   K 3.8 07/08/2023 1231   CL 104 07/08/2023 1231   CO2 27 07/08/2023 1231   GLUCOSE 117 (H) 07/08/2023 1231   BUN 8 07/08/2023 1231   BUN 5 09/15/2021 0000   CREATININE 1.07 07/08/2023 1231   CALCIUM 8.6 (L) 07/08/2023 1231   PROT 7.8 12/28/2022 1904   ALBUMIN 3.9 12/28/2022 1904   AST 16 12/28/2022 1904   ALT 9 12/28/2022 1904   ALKPHOS 77 12/28/2022 1904   BILITOT 0.4 12/28/2022 1904   GFRNONAA >60 07/08/2023 1231   GFRAA >60 03/13/2019 2319    Lab Results  Component Value Date   WBC 9.6 07/08/2023   HGB 12.1 (L) 07/08/2023   HCT  38.3 (L) 07/08/2023  MCV 93.6 07/08/2023   PLT 361 07/08/2023   Lab Results  Component Value Date   HGBA1C 5.9 (H) 02/05/2019   No results found for: "CHOL", "HDL", "LDLCALC", "LDLDIRECT", "TRIG", "CHOLHDL" Lab Results  Component Value Date   TSH 1.435 03/13/2019     Assessment and Plan:  1. Excoriation (skin-picking) disorder I do have some concerns for potential or existing skin infection with continued excoriation.  Patient is adamant that he has not been scratching and denies the possibility that he could be scratching in his sleep or otherwise subconsciously.  Will treat with another round of doxycycline and restart venlafaxine.  Would be best managed by behavioral health. - doxycycline (ADOXA) 100 MG tablet; Take 1 tablet (100 mg total) by mouth 2 (two) times daily.  Dispense: 10 tablet; Refill: 0 - venlafaxine XR (EFFEXOR XR) 75 MG 24 hr capsule; Take 1 capsule (75 mg total) by mouth daily with breakfast.  Dispense: 90 capsule; Refill: 1  2. Psychogenic formication Patient still resistant to accepting this as the cause for his itching.  Expressed some ideas of paranoia today when, in response to my insistence that he does not have parasites, he stated "I don't know if someone told you to say that or what, but the bugs are there."  Restart venlafaxine with plan for dose titration.  Also discussed one-time dose of albendazole as antiparasitic/antihelminthic, but advised he not take this medication until he has completed his stool collection for stool O&P with GI.  - venlafaxine XR (EFFEXOR XR) 75 MG 24 hr capsule; Take 1 capsule (75 mg total) by mouth daily with breakfast.  Dispense: 90 capsule; Refill: 1 - albendazole (ALBENZA) 200 MG tablet; Take 2 tablets (400 mg total) by mouth once for 1 dose. Do not take until after you have collected the stool sample for GI.  Dispense: 2 tablet; Refill: 0   Return in about 4 weeks (around 11/24/2023) for OV f/u itching.    Alvester Morin,  PA-C, DMSc, Nutritionist [facility] [phone]

## 2023-10-28 ENCOUNTER — Ambulatory Visit: Payer: Medicare HMO | Admitting: Physician Assistant

## 2023-10-28 NOTE — Progress Notes (Deleted)
    Celso Amy, PA-C [address]  [address]  [place], [place] [address]  Main: [phone]  Fax: [phone]   Primary Care Physician: Remo Lipps, PA  Primary Gastroenterologist:  ***  CC: Follow-up rectal itching  HPI: [name] is a 42 y.o. male returns for 8-month follow-up of rectal itching.  He has history of psychogenic formication / excoriation disorder.  Hx IVDA - Opioids (fentanyl, heroin, oral opioids). Has been with [facility] methadone clinic in [place] since Nov 2023, relapse of IV opioid Feb 2024 but has not used since. Switching clinic to [facility] location.   He has perianal irritation and itching.  He aggressively itches and claims the rectum.  Is worried about worms in his stool.  Has constipation.  Tried MiraLAX with no benefit.  At his last office visit he was started on Amitiza 24 mcg twice daily.  Ova and parasite stool twist was ordered, yet not completed.  He was given hydrocortisone suppositories and nystatin cream with follow-up.  Colonoscopy was ordered and patient canceled procedure.  He was given treatment for excoriation disorder by his PCP with venlafaxine.  Also tried Risperdal, however patient discontinued treatment.  Was referred to psychiatry, however patient did not keep appointment.  Current Outpatient Medications  Medication Sig Dispense Refill   [START ON 10/30/2023] albendazole (ALBENZA) 200 MG tablet Take 2 tablets (400 mg total) by mouth once for 1 dose. Do not take until after you have collected the stool sample for GI. 2 tablet 0   doxycycline (ADOXA) 100 MG tablet Take 1 tablet (100 mg total) by mouth 2 (two) times daily. 10 tablet 0   venlafaxine XR (EFFEXOR XR) 75 MG 24 hr capsule Take 1 capsule (75 mg total) by mouth daily with breakfast. 90 capsule 1   No current facility-administered medications for this visit.    Allergies as of 10/29/2023   (No Known Allergies)    Past Medical History:  Diagnosis Date    Allergy    Opioid abuse (HCC) 03/2019   on-going daily use of IV Fentanyl    No past surgical history on file.  Review of Systems:    All systems reviewed and negative except where noted in HPI.   Physical Examination:   There were no vitals taken for this visit.  General: Well-nourished, well-developed in no acute distress.  Lungs: Clear to auscultation bilaterally. Non-labored. Heart: Regular rate and rhythm, no murmurs rubs or gallops.  Abdomen: Bowel sounds are normal; Abdomen is Soft; No hepatosplenomegaly, masses or hernias;  No Abdominal Tenderness; No guarding or rebound tenderness. Neuro: Alert and oriented x 3.  Grossly intact.  Psych: Alert and cooperative, normal mood and affect.   Imaging Studies: No results found.  Assessment and Plan:   [name] is a 42 y.o. y/o male returns for follow-up of:  Psychogenic formication / excoriation disorder.  F/U with Psychiatry  Perceived abnormal stools; evaluate for parasites  Complete O & P stool test  Constipation  Rectal itching  5.  Episode of rectal bleeding  Lab CBC  Scheduling Colonoscopy I discussed risks of colonoscopy with patient to include risk of bleeding, colon perforation, and risk of sedation.  Patient expressed understanding and agrees to proceed with colonoscopy.    Celso Amy, PA-C  Follow up ***  BP check ***

## 2023-10-29 ENCOUNTER — Ambulatory Visit: Payer: Medicare HMO | Admitting: Physician Assistant

## 2023-11-18 DIAGNOSIS — Z0100 Encounter for examination of eyes and vision without abnormal findings: Secondary | ICD-10-CM | POA: Diagnosis not present

## 2023-11-18 NOTE — Telephone Encounter (Signed)
 Please review.  KP

## 2023-11-22 ENCOUNTER — Other Ambulatory Visit: Payer: Self-pay

## 2023-11-22 DIAGNOSIS — F54 Psychological and behavioral factors associated with disorders or diseases classified elsewhere: Secondary | ICD-10-CM

## 2023-11-22 MED ORDER — ALBENDAZOLE 200 MG PO TABS: 400.0000 mg | ORAL_TABLET | Freq: Once | ORAL | 0 refills | Status: AC

## 2023-11-29 ENCOUNTER — Ambulatory Visit: Payer: Medicare HMO | Admitting: Physician Assistant

## 2023-12-06 ENCOUNTER — Emergency Department: Payer: Medicare HMO

## 2023-12-06 ENCOUNTER — Other Ambulatory Visit: Payer: Self-pay

## 2023-12-06 ENCOUNTER — Emergency Department
Admission: EM | Admit: 2023-12-06 | Discharge: 2023-12-06 | Disposition: A | Discharge status: Home / Self Care | Payer: Medicare HMO | Attending: Emergency Medicine | Admitting: Emergency Medicine

## 2023-12-06 DIAGNOSIS — S81801A Unspecified open wound, right lower leg, initial encounter: Secondary | ICD-10-CM | POA: Diagnosis not present

## 2023-12-06 DIAGNOSIS — W461XXA Contact with contaminated hypodermic needle, initial encounter: Secondary | ICD-10-CM | POA: Insufficient documentation

## 2023-12-06 DIAGNOSIS — R601 Generalized edema: Secondary | ICD-10-CM | POA: Diagnosis not present

## 2023-12-06 DIAGNOSIS — M79661 Pain in right lower leg: Secondary | ICD-10-CM | POA: Diagnosis not present

## 2023-12-06 DIAGNOSIS — S8991XA Unspecified injury of right lower leg, initial encounter: Secondary | ICD-10-CM | POA: Diagnosis present

## 2023-12-06 DIAGNOSIS — F424 Excoriation (skin-picking) disorder: Secondary | ICD-10-CM

## 2023-12-06 DIAGNOSIS — M7989 Other specified soft tissue disorders: Secondary | ICD-10-CM | POA: Diagnosis not present

## 2023-12-06 MED ORDER — DOXYCYCLINE MONOHYDRATE 100 MG PO TABS: 100.0000 mg | ORAL_TABLET | Freq: Two times a day (BID) | ORAL | 0 refills | Status: DC

## 2023-12-06 NOTE — Discharge Instructions (Addendum)
Please seek medical attention for any high fevers, chest pain, shortness of breath, change in behavior, persistent vomiting, bloody stool or any other new or concerning symptoms.

## 2023-12-06 NOTE — ED Notes (Signed)
See triage notes. Patient c/o wound on his right leg opening back up. Stated he had placed tape with metal in it on it.

## 2023-12-06 NOTE — ED Triage Notes (Signed)
Patient presents with swelling to the right lower leg and wounds on both sides of the calf. Patient reports the wound occurred from injecting fentanyl in to the leg. Patient reports pain in the lower leg, 10/10. Wounds appear to have purulent drainage, no bleeding. Patient states it has been about a year since he has been seen by the wound care clinic to treat the wounds.

## 2023-12-06 NOTE — ED Notes (Signed)
Unable to draw labs due to patient being a difficult stick.

## 2023-12-06 NOTE — ED Provider Notes (Signed)
[facility] Provider Note    Event Date/Time   First MD Initiated Contact with Patient 12/06/23 [phone]     (approximate)   History   Leg Injury   HPI  [name] is a 43 y.o. male  who presents to the emergency department today because of concern for wounds to his right leg. The patient states the wounds have been there for a long time but started getting worse again about 2 weeks ago. He had seen wound care clinic for these wounds last year. Does have pain with the wounds. Denies any fevers, nausea or vomiting.        Physical Exam   Triage Vital Signs: ED Triage Vitals  Encounter Vitals Group     BP 12/06/23 0308 (!) 159/120     Systolic BP Percentile --      Diastolic BP Percentile --      Pulse Rate 12/06/23 0308 88     Resp 12/06/23 0308 20     Temp 12/06/23 0308 98.3 F (36.8 C)     Temp Source 12/06/23 0308 Oral     SpO2 12/06/23 0308 99 %     Weight 12/06/23 0309 215 lb (97.5 kg)     Height 12/06/23 0309 6\' 1"  (1.854 m)     Head Circumference --      Peak Flow --      Pain Score 12/06/23 0309 10     Pain Loc --      Pain Education --      Exclude from Growth Chart --     Most recent vital signs: Vitals:   12/06/23 0308  BP: (!) 159/120  Pulse: 88  Resp: 20  Temp: 98.3 F (36.8 C)  SpO2: 99%   General: Awake, alert, oriented. CV:  Good peripheral perfusion.  Resp:  Normal effort.  Abd:  No distention.  Other:  Right leg with wounds to the medial and lateral aspect of the lower leg. Some granulation type appearance. No bad odor. No significant drainage. No surrounding erythema.    ED Results / Procedures / Treatments   Labs (all labs ordered are listed, but only abnormal results are displayed) None   EKG  None   RADIOLOGY I independently interpreted and visualized the right tib/fib. My interpretation: No fracture Radiology interpretation:  IMPRESSION:  Mild-to-moderate generalized stranding edema. Shallow  skin wounds  laterally and medially in the mid calf area. No visible foreign  body. No acute osseous abnormality.     PROCEDURES:  Critical Care performed: No  Procedures    MEDICATIONS ORDERED IN ED: Medications - No data to display   IMPRESSION / MDM / ASSESSMENT AND PLAN / ED COURSE  I reviewed the triage vital signs and the nursing notes.                              Differential diagnosis includes, but is not limited to, cellulitis, osteomyelitis, necrotizing fasciitis.   Patient's presentation is most consistent with acute presentation with potential threat to life or bodily function.  Patient presented to the emergency department today because of concern for wounds to his right lower leg. No obvious signs of infection at this time, however given change in wound and increasing pain will plan on starting on abx. Discussed with patient importance of close follow up with wound care center.       FINAL CLINICAL IMPRESSION(S) / ED  DIAGNOSES   Final diagnoses:  Wound of right lower extremity, initial encounter     Note:  This document was prepared using Dragon voice recognition software and may include unintentional dictation errors.    Phineas Semen, MD 12/06/23 [phone]

## 2024-01-06 ENCOUNTER — Ambulatory Visit: Payer: Medicare HMO | Admitting: Physician Assistant

## 2024-01-17 ENCOUNTER — Ambulatory Visit: Payer: Medicare HMO | Admitting: Physician Assistant

## 2024-04-11 ENCOUNTER — Other Ambulatory Visit: Payer: Self-pay

## 2024-04-11 ENCOUNTER — Observation Stay: Admit: 2024-04-11

## 2024-04-11 ENCOUNTER — Inpatient Hospital Stay
Admission: EM | Admit: 2024-04-11 | Discharge: 2024-04-12 | DRG: 593 | Discharge status: Against Medical Advice | Attending: Internal Medicine | Admitting: Internal Medicine

## 2024-04-11 DIAGNOSIS — A419 Sepsis, unspecified organism: Secondary | ICD-10-CM | POA: Diagnosis present

## 2024-04-11 DIAGNOSIS — Z9081 Acquired absence of spleen: Secondary | ICD-10-CM

## 2024-04-11 DIAGNOSIS — F17290 Nicotine dependence, other tobacco product, uncomplicated: Secondary | ICD-10-CM | POA: Diagnosis present

## 2024-04-11 DIAGNOSIS — L97919 Non-pressure chronic ulcer of unspecified part of right lower leg with unspecified severity: Secondary | ICD-10-CM | POA: Diagnosis present

## 2024-04-11 DIAGNOSIS — L089 Local infection of the skin and subcutaneous tissue, unspecified: Secondary | ICD-10-CM | POA: Diagnosis not present

## 2024-04-11 DIAGNOSIS — R011 Cardiac murmur, unspecified: Secondary | ICD-10-CM

## 2024-04-11 DIAGNOSIS — L97929 Non-pressure chronic ulcer of unspecified part of left lower leg with unspecified severity: Secondary | ICD-10-CM | POA: Diagnosis not present

## 2024-04-11 DIAGNOSIS — Z833 Family history of diabetes mellitus: Secondary | ICD-10-CM

## 2024-04-11 DIAGNOSIS — T148XXA Other injury of unspecified body region, initial encounter: Secondary | ICD-10-CM | POA: Diagnosis not present

## 2024-04-11 DIAGNOSIS — Z5329 Procedure and treatment not carried out because of patient's decision for other reasons: Secondary | ICD-10-CM | POA: Diagnosis present

## 2024-04-11 DIAGNOSIS — Z8614 Personal history of Methicillin resistant Staphylococcus aureus infection: Secondary | ICD-10-CM

## 2024-04-11 DIAGNOSIS — L98492 Non-pressure chronic ulcer of skin of other sites with fat layer exposed: Secondary | ICD-10-CM | POA: Diagnosis not present

## 2024-04-11 DIAGNOSIS — Z79899 Other long term (current) drug therapy: Secondary | ICD-10-CM

## 2024-04-11 DIAGNOSIS — Z8249 Family history of ischemic heart disease and other diseases of the circulatory system: Secondary | ICD-10-CM

## 2024-04-11 DIAGNOSIS — F1190 Opioid use, unspecified, uncomplicated: Secondary | ICD-10-CM | POA: Diagnosis not present

## 2024-04-11 DIAGNOSIS — R7881 Bacteremia: Secondary | ICD-10-CM | POA: Diagnosis present

## 2024-04-11 DIAGNOSIS — B9562 Methicillin resistant Staphylococcus aureus infection as the cause of diseases classified elsewhere: Secondary | ICD-10-CM

## 2024-04-11 DIAGNOSIS — F458 Other somatoform disorders: Secondary | ICD-10-CM | POA: Diagnosis present

## 2024-04-11 DIAGNOSIS — F1123 Opioid dependence with withdrawal: Secondary | ICD-10-CM | POA: Diagnosis present

## 2024-04-11 DIAGNOSIS — F1193 Opioid use, unspecified with withdrawal: Principal | ICD-10-CM

## 2024-04-11 LAB — URINE DRUG SCREEN, QUALITATIVE (ARMC ONLY)
Amphetamines, Ur Screen: NOT DETECTED
Barbiturates, Ur Screen: NOT DETECTED
Benzodiazepine, Ur Scrn: NOT DETECTED
Cannabinoid 50 Ng, Ur ~~LOC~~: NOT DETECTED
Cocaine Metabolite,Ur ~~LOC~~: POSITIVE — AB
MDMA (Ecstasy)Ur Screen: NOT DETECTED
Methadone Scn, Ur: NOT DETECTED
Opiate, Ur Screen: NOT DETECTED
Phencyclidine (PCP) Ur S: NOT DETECTED
Tricyclic, Ur Screen: NOT DETECTED

## 2024-04-11 LAB — COMPREHENSIVE METABOLIC PANEL WITH GFR
ALT: 13 U/L (ref 0–44)
AST: 25 U/L (ref 15–41)
Albumin: 4 g/dL (ref 3.5–5.0)
Alkaline Phosphatase: 76 U/L (ref 38–126)
Anion gap: 13 (ref 5–15)
BUN: 8 mg/dL (ref 6–20)
CO2: 27 mmol/L (ref 22–32)
Calcium: 9.5 mg/dL (ref 8.9–10.3)
Chloride: 98 mmol/L (ref 98–111)
Creatinine, Ser: 1.02 mg/dL (ref 0.61–1.24)
GFR, Estimated: >60 mL/min (ref >60)
Glucose, Bld: 139 mg/dL — ABNORMAL HIGH (ref 70–99)
Potassium: 3.3 mmol/L — ABNORMAL LOW (ref 3.5–5.1)
Sodium: 138 mmol/L (ref 135–145)
Total Bilirubin: 1 mg/dL (ref 0.0–1.2)
Total Protein: 8.6 g/dL — ABNORMAL HIGH (ref 6.5–8.1)

## 2024-04-11 LAB — CBC WITH DIFFERENTIAL/PLATELET
Abs Immature Granulocytes: 0.27 10*3/uL — ABNORMAL HIGH (ref 0.00–0.07)
Basophils Absolute: 0 10*3/uL (ref 0.0–0.1)
Basophils Relative: 0 %
Eosinophils Absolute: 0 10*3/uL (ref 0.0–0.5)
Eosinophils Relative: 0 %
HCT: 39.1 % (ref 39.0–52.0)
Hemoglobin: 12.5 g/dL — ABNORMAL LOW (ref 13.0–17.0)
Immature Granulocytes: 1 %
Lymphocytes Relative: 2 %
Lymphs Abs: 0.5 10*3/uL — ABNORMAL LOW (ref 0.7–4.0)
MCH: 28.3 pg (ref 26.0–34.0)
MCHC: 32 g/dL (ref 30.0–36.0)
MCV: 88.5 fL (ref 80.0–100.0)
Monocytes Absolute: 0.6 10*3/uL (ref 0.1–1.0)
Monocytes Relative: 2 %
Neutro Abs: 28.8 10*3/uL — ABNORMAL HIGH (ref 1.7–7.7)
Neutrophils Relative %: 95 %
Platelets: 456 10*3/uL — ABNORMAL HIGH (ref 150–400)
RBC: 4.42 MIL/uL (ref 4.22–5.81)
RDW: 14.2 % (ref 11.5–15.5)
Smear Review: NORMAL
WBC: 30.2 10*3/uL — ABNORMAL HIGH (ref 4.0–10.5)
nRBC: 0 % (ref 0.0–0.2)

## 2024-04-11 LAB — HEPATITIS PANEL, ACUTE
HCV Ab: NONREACTIVE
Hep A IgM: NONREACTIVE
Hep B C IgM: NONREACTIVE
Hepatitis B Surface Ag: NONREACTIVE

## 2024-04-11 LAB — ETHANOL: Alcohol, Ethyl (B): <15 mg/dL (ref <15)

## 2024-04-11 LAB — BLOOD GAS, VENOUS: Patient temperature: 37

## 2024-04-11 LAB — MAGNESIUM: Magnesium: 1.8 mg/dL (ref 1.7–2.4)

## 2024-04-11 LAB — TROPONIN I (HIGH SENSITIVITY): Troponin I (High Sensitivity): 9 ng/L (ref <18)

## 2024-04-11 MED ORDER — LACTATED RINGERS IV BOLUS
1000.0000 mL | Freq: Once | INTRAVENOUS | Status: AC
  Administered 2024-04-11: 1000 mL via INTRAVENOUS

## 2024-04-11 MED ORDER — KETOROLAC TROMETHAMINE 15 MG/ML IJ SOLN
15.0000 mg | Freq: Four times a day (QID) | INTRAMUSCULAR | Status: DC
  Administered 2024-04-11 – 2024-04-12 (×3): 15 mg via INTRAVENOUS
  Filled 2024-04-11 (×3): qty 1

## 2024-04-11 MED ORDER — ACETAMINOPHEN 325 MG PO TABS
650.0000 mg | ORAL_TABLET | Freq: Four times a day (QID) | ORAL | Status: DC | PRN
  Filled 2024-04-11: qty 2

## 2024-04-11 MED ORDER — BUPRENORPHINE HCL-NALOXONE HCL 8-2 MG SL SUBL
1.0000 | SUBLINGUAL_TABLET | Freq: Once | SUBLINGUAL | Status: AC
  Administered 2024-04-11: 1 via SUBLINGUAL
  Filled 2024-04-11: qty 1

## 2024-04-11 MED ORDER — SODIUM CHLORIDE 0.9 % IV SOLN
2.0000 g | Freq: Once | INTRAVENOUS | Status: AC
  Administered 2024-04-11: 2 g via INTRAVENOUS
  Filled 2024-04-11: qty 12.5

## 2024-04-11 MED ORDER — LACTATED RINGERS IV SOLN: INTRAVENOUS | Status: AC

## 2024-04-11 MED ORDER — ONDANSETRON HCL 4 MG/2ML IJ SOLN
4.0000 mg | Freq: Four times a day (QID) | INTRAMUSCULAR | Status: DC | PRN
Start: 2024-04-11 — End: 2024-04-12
  Administered 2024-04-12 (×2): 4 mg via INTRAVENOUS
  Filled 2024-04-11 (×2): qty 2

## 2024-04-11 MED ORDER — BUPRENORPHINE HCL-NALOXONE HCL 8-2 MG SL SUBL
1.0000 | SUBLINGUAL_TABLET | Freq: Three times a day (TID) | SUBLINGUAL | Status: DC
  Administered 2024-04-11 – 2024-04-12 (×3): 1 via SUBLINGUAL
  Filled 2024-04-11 (×3): qty 1

## 2024-04-11 MED ORDER — GABAPENTIN 300 MG PO CAPS
300.0000 mg | ORAL_CAPSULE | Freq: Once | ORAL | Status: AC
  Administered 2024-04-11: 300 mg via ORAL
  Filled 2024-04-11: qty 1

## 2024-04-11 MED ORDER — POTASSIUM CHLORIDE CRYS ER 20 MEQ PO TBCR
40.0000 meq | EXTENDED_RELEASE_TABLET | Freq: Once | ORAL | Status: AC
  Administered 2024-04-11: 40 meq via ORAL
  Filled 2024-04-11: qty 2

## 2024-04-11 MED ORDER — POTASSIUM CHLORIDE 10 MEQ/100ML IV SOLN: 10.0000 meq | INTRAVENOUS | Status: DC

## 2024-04-11 MED ORDER — METRONIDAZOLE 500 MG/100ML IV SOLN
500.0000 mg | Freq: Two times a day (BID) | INTRAVENOUS | Status: DC
  Administered 2024-04-11 (×2): 500 mg via INTRAVENOUS
  Filled 2024-04-11 (×3): qty 100

## 2024-04-11 MED ORDER — LORAZEPAM 1 MG PO TABS
1.0000 mg | ORAL_TABLET | Freq: Once | ORAL | Status: AC
  Administered 2024-04-11: 1 mg via ORAL
  Filled 2024-04-11: qty 1

## 2024-04-11 MED ORDER — ONDANSETRON 4 MG PO TBDP
4.0000 mg | ORAL_TABLET | Freq: Once | ORAL | Status: DC
  Filled 2024-04-11: qty 1

## 2024-04-11 MED ORDER — ONDANSETRON 4 MG PO TBDP
4.0000 mg | ORAL_TABLET | Freq: Once | ORAL | Status: AC
  Administered 2024-04-11: 4 mg via ORAL

## 2024-04-11 MED ORDER — ONDANSETRON HCL 4 MG PO TABS
4.0000 mg | ORAL_TABLET | Freq: Four times a day (QID) | ORAL | Status: DC | PRN
  Administered 2024-04-11 – 2024-04-12 (×2): 4 mg via ORAL
  Filled 2024-04-11 (×2): qty 1

## 2024-04-11 MED ORDER — KETOROLAC TROMETHAMINE 30 MG/ML IJ SOLN
30.0000 mg | Freq: Once | INTRAMUSCULAR | Status: AC
  Administered 2024-04-11: 30 mg via INTRAVENOUS
  Filled 2024-04-11: qty 1

## 2024-04-11 MED ORDER — SODIUM CHLORIDE 0.9% FLUSH
3.0000 mL | Freq: Two times a day (BID) | INTRAVENOUS | Status: DC
  Administered 2024-04-11 – 2024-04-12 (×2): 3 mL via INTRAVENOUS

## 2024-04-11 MED ORDER — LOPERAMIDE HCL 2 MG PO CAPS: 2.0000 mg | ORAL_CAPSULE | ORAL | Status: DC | PRN

## 2024-04-11 MED ORDER — VANCOMYCIN HCL 1500 MG/300ML IV SOLN
1500.0000 mg | Freq: Two times a day (BID) | INTRAVENOUS | Status: DC
  Administered 2024-04-12: 1500 mg via INTRAVENOUS
  Filled 2024-04-11 (×2): qty 300

## 2024-04-11 MED ORDER — LORAZEPAM 2 MG/ML IJ SOLN
1.0000 mg | Freq: Four times a day (QID) | INTRAMUSCULAR | Status: DC | PRN
  Administered 2024-04-11: 1 mg via INTRAMUSCULAR
  Filled 2024-04-11: qty 1

## 2024-04-11 MED ORDER — CLONIDINE HCL 0.1 MG PO TABS
0.1000 mg | ORAL_TABLET | Freq: Once | ORAL | Status: AC
  Administered 2024-04-11: 0.1 mg via ORAL
  Filled 2024-04-11: qty 1

## 2024-04-11 MED ORDER — ENOXAPARIN SODIUM 40 MG/0.4ML IJ SOSY
40.0000 mg | PREFILLED_SYRINGE | INTRAMUSCULAR | Status: DC
  Administered 2024-04-11 – 2024-04-12 (×2): 40 mg via SUBCUTANEOUS
  Filled 2024-04-11 (×2): qty 0.4

## 2024-04-11 MED ORDER — METRONIDAZOLE 500 MG/100ML IV SOLN: 500.0000 mg | Freq: Once | INTRAVENOUS | Status: DC

## 2024-04-11 MED ORDER — ACETAMINOPHEN 650 MG RE SUPP
650.0000 mg | Freq: Four times a day (QID) | RECTAL | Status: DC | PRN
Start: 2024-04-11 — End: 2024-04-12

## 2024-04-11 MED ORDER — METOCLOPRAMIDE HCL 5 MG/ML IJ SOLN
5.0000 mg | Freq: Once | INTRAMUSCULAR | Status: AC
  Administered 2024-04-11: 5 mg via INTRAMUSCULAR
  Filled 2024-04-11: qty 2

## 2024-04-11 MED ORDER — VANCOMYCIN HCL 2000 MG/400ML IV SOLN
2000.0000 mg | Freq: Once | INTRAVENOUS | Status: AC
  Administered 2024-04-11: 2000 mg via INTRAVENOUS
  Filled 2024-04-11 (×2): qty 400

## 2024-04-11 MED ORDER — CLONIDINE HCL 0.1 MG PO TABS
0.1000 mg | ORAL_TABLET | Freq: Two times a day (BID) | ORAL | Status: DC
  Administered 2024-04-11 – 2024-04-12 (×2): 0.1 mg via ORAL
  Filled 2024-04-11 (×2): qty 1

## 2024-04-11 NOTE — ED Notes (Signed)
Informed RN bed assigned 

## 2024-04-11 NOTE — ED Triage Notes (Signed)
 Pt arrives via [facility] for possible withdrawal from fentanyl. Last use was last night. Per EMS, there were hundreds of exposed needles in his room. Pt with runny nose and n/v. Injection site wounds to both legs.

## 2024-04-11 NOTE — H&P (Signed)
 History and Physical    Patient: [name] WUJ:[id number] DOB: 1981/09/18 DOA: 04/11/2024 DOS: the patient was seen and examined on 04/11/2024 PCP: Leopoldo Rancher, PA  Patient coming from: Home  Chief Complaint:  Chief Complaint  Patient presents with   Drug Problem   HPI: [name] is a 43 y.o. male with medical history significant of opioid use disorder with intravenous fentanyl use, psychogenic formication, who presents to the ED due to opioid withdrawal.   History limited as patient is not wanting to participate in discussion at this time.  [name] mother provides collateral history.  She went to go check on him today in his room and noted that he was in active withdrawal with needles surrounding him.  She also noted his lower extremity wounds and was concerned that they have significantly worsened.  She notes that these wounds have been present for at least 1 year.  When asked [name] if he injects into the wounds or picks at them, he nods his head.  ED course: On arrival to the ED, patient was hypertensive at 173/96 with heart rate of 74.  He was saturating at 99% on room air.  He was afebrile at 99.4.Initial workup notable for WBC of 30.2, hemoglobin 12.5, platelets 456, and unremarkable CMP.  VBG with pH of 7.45 and pCO2 of 42.  Alcohol level negative.  Patient started on cefepime, Flagyl, vancomycin, Suboxone x 3, clonidine , gabapentin, Ativan , and IV fluids.  TRH contacted for admission.  Review of Systems: As mentioned in the history of present illness. All other systems reviewed and are negative.  Past Medical History:  Diagnosis Date   Allergy    Opioid abuse (HCC) 03/2019   on-going daily use of IV Fentanyl   No past surgical history on file. Social History:  reports that he has never smoked. He has never used smokeless tobacco. He reports current drug use. Drug: IV. He reports that he does not drink alcohol.  No Known Allergies  Family History  Problem  Relation Age of Onset   Hypertension Mother    Diabetes Mother     Prior to Admission medications   Medication Sig Start Date End Date Taking? Authorizing Provider  albendazole  (ALBENZA ) 200 MG tablet Take 200 mg by mouth 2 (two) times daily with a meal. Patient not taking: Reported on 04/11/2024 11/22/23   [provider]  cephALEXin  (KEFLEX ) 500 MG capsule Take 500 mg by mouth every 12 (twelve) hours. Patient not taking: Reported on 04/11/2024 01/26/24   [provider]  ciprofloxacin (CIPRO) 250 MG tablet Take 250 mg by mouth 2 (two) times daily. Patient not taking: Reported on 04/11/2024 03/01/24   [provider]  doxycycline  (ADOXA) 100 MG tablet Take 1 tablet (100 mg total) by mouth 2 (two) times daily. Patient not taking: Reported on 04/11/2024 12/06/23   Goodman, Graydon, MD  venlafaxine  XR (EFFEXOR  XR) 75 MG 24 hr capsule Take 1 capsule (75 mg total) by mouth daily with breakfast. Patient not taking: Reported on 04/11/2024 10/27/23   Leopoldo Rancher, PA    Physical Exam: Vitals:   04/11/24 1122 04/11/24 1123 04/11/24 1130 04/11/24 1437  BP:  (!) 177/92 (!) 173/96 (!) 162/74  Pulse:  73 81 61  Resp:  20 15 15   Temp:  99.4 F (37.4 C)    TempSrc:  Axillary    SpO2:  100% 99% 98%  Weight: 93.4 kg     Height: 6\' 1"  (1.854 m)  Physical Exam Vitals and nursing note reviewed.  Constitutional:      Appearance: He is ill-appearing.  HENT:     Head: Normocephalic and atraumatic.  Eyes:     Conjunctiva/sclera: Conjunctivae normal.     Pupils: Pupils are equal, round, and reactive to light.  Cardiovascular:     Rate and Rhythm: Normal rate and regular rhythm.     Heart sounds: Murmur (3/6 holosystolic murmur) heard.     No gallop.  Pulmonary:     Effort: Pulmonary effort is normal. No respiratory distress.     Breath sounds: Normal breath sounds. No wheezing, rhonchi or rales.  Abdominal:     General: Bowel sounds are normal. There is no distension.      Palpations: Abdomen is soft.     Tenderness: There is no abdominal tenderness.  Skin:    General: Skin is warm and dry.     Comments: Multiple ulcers of the lower extremities, both on the medial and lateral aspect.  Please see pictures below.  Increased warmth to touch with significant tenderness.  Neurological:     Mental Status: He is alert.     Comments:  Patient is alert and nods yes/no to simple questions.  Psychiatric:        Mood and Affect: Affect is flat.        Behavior: Behavior is withdrawn.            Data Reviewed: CBC with WBC of 30.2, hemoglobin of 12.5, platelets of 456 CMP with sodium of 138, potassium 3.3, bicarb 27, glucose 139, AST 25, ALT 13, GFR above 60 Troponin 9 Alcohol level negative VBG with pH is 7.45 [id number]  Telemetry reviewed.  Sinus rhythm with no evidence of significant QTc prolongation.  Results are pending, will review when available.  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  Difficult to say if these appear significantly worse compared to prior, however given increased warmth/TTP with severe leukocytosis, will treat with vancomycin.  Will provide additional anaerobic coverage with Flagyl.  - Vancomycin per pharmacy dosing - Flagyl 500 mg IV twice daily - Repeat CBC in the a.m. - Wound care consult - Blood cultures  Opioid use disorder Patient has a long-term history of opioid use disorder, previously treated with methadone.  He was treated with some oxygen in the ED, so we will continue at this time.  Given active withdrawal, suspect he will need additional agents including clonidine  +/- gabapentin.  - S/p Suboxone 8-2 mg x 3 - Continue Suboxone 3 times daily starting this evening - Clonidine  0.1 mg twice daily - Zofran  and Imodium as needed - Telemetry  monitoring for QTc prolongation - Check HIV and hepatitis panel  Systolic murmur Given high risk history, will obtain echocardiogram.  - Echocardiogram ordered - Blood cultures ordered  Advance Care Planning:   Code Status: Full Code as patient is unable to make medical decisions at this time.  Consults: None  Family Communication: Patient's mother updated at bedside  Severity of Illness: The appropriate patient status for this patient is OBSERVATION. Observation status is judged to be reasonable and necessary in order to provide the required intensity of service to ensure the patient's safety. The patient's presenting symptoms, physical exam findings, and initial radiographic and laboratory data in the context of their medical condition is felt  to place them at decreased risk for further clinical deterioration. Furthermore, it is anticipated that the patient will be medically stable for discharge from the hospital within 2 midnights of admission.   Author: Avi Body, MD 04/11/2024 3:59 PM  For on call review [email or web address].

## 2024-04-11 NOTE — Assessment & Plan Note (Addendum)
 Given high risk history, will obtain echocardiogram.  - Echocardiogram ordered - Blood cultures ordered

## 2024-04-11 NOTE — ED Notes (Signed)
 Yellow socks, fall band and bed alarm placed on pt with room door open on.

## 2024-04-11 NOTE — Consult Note (Signed)
 Pharmacy Antibiotic Note  [name] is a 43 y.o. male admitted on 04/11/2024 with cellulitis.  Pharmacy has been consulted for Vancomycin dosing.  Plan: Vancomycin 2000 mg IV x 1 as loading dose, followed by: 1500mg  Q 12 hrs. Goal AUC 400-550. Expected AUC: 480.2 Expected Cmin: 12.5 SCr used: 1.02, Vd used: 0.72   Height: 6\' 1"  (185.4 cm) Weight: 93.4 kg (206 lb) IBW/kg (Calculated) : 79.9  Temp (24hrs), Avg:99.7 F (37.6 C), Min:99.4 F (37.4 C), Max:99.9 F (37.7 C)  Recent Labs  Lab 04/11/24 1217  WBC 30.2*  CREATININE 1.02    Estimated Creatinine Clearance: 106.6 mL/min (by C-G formula based on SCr of 1.02 mg/dL).    No Known Allergies  Antimicrobials this admission: Vancomycin 6/3 >>  Flagyl 6/3 >>  Cefepime 6/3 x 1  Dose adjustments this admission: N/A  Microbiology results: 6/3 BCx: collected  Thank you for allowing pharmacy to be a part of this patient's care.  Javin Nong A Keniya Schlotterbeck 04/11/2024 5:35 PM

## 2024-04-11 NOTE — Consult Note (Signed)
 WOC Nurse Consult Note: Reason for Consult: B lower leg wounds  Wound type: full thickness B lower legs r/t IV drug use  Pressure Injury POA: NA  Measurement: see nursing flowsheet  Wound bed: L medial calf 75% red 25% yellow brown, R calf 50% red 50% yellow; L calf with dark hemorrhagic tissue, R calf largely red moist  Drainage (amount, consistency, odor)  some purulent per MD note  Periwound: Dressing procedure/placement/frequency:  Cleanse B lower leg wounds with Vashe wound cleanser Timm Foot [phone]) do not rinse and allow to air dry. Apply Vashe moistened gauze daily to wound beds, cover with ABD pads and Kerlix roll gauze anchored around feet to help secure. Can apply Ace bandage wrapped in same fashion as Kerlix for light compression.    Patient would benefit from ongoing management of these wounds at wound care center however unlikely to heal as long as patient continues drug use.    POC discussed with bedside nurse. WOC team will not follow. Re-consult if further needs arise.   Thank you,    Ronni Colace MSN, RN-BC, [employer] [phone]

## 2024-04-11 NOTE — ED Provider Notes (Signed)
 [facility] Provider Note    Event Date/Time   First MD Initiated Contact with Patient 04/11/24 1131     (approximate)   History   Drug Problem   HPI  [name] is a 43 y.o. male with history of opiate abuse, presenting with withdrawals.  Has history of using fentanyl and xylazine.  Last use fentanyl yesterday.  States that he typically injects 3 g of fentanyl at this time.  Still having body aches, dry heaving, sweating.  Also has wounds to his bilateral legs from where he injects, there is purulent drainage, no fever.  Per independent chief from EMS, was brought in for possible withdrawal from fentanyl, last use yesterday, noted multiple exposed needles in his room.  They are unable to get a line so no meds were given.   On independent chart review, he does have noted wounds to his right leg in the past, this was during his prior ED visit in January.  He was seen by his primary care doctor in December, physical exam did not show any open wounds to his legs.  Physical Exam   Triage Vital Signs: ED Triage Vitals  Encounter Vitals Group     BP 04/11/24 1123 (!) 177/92     Systolic BP Percentile --      Diastolic BP Percentile --      Pulse Rate 04/11/24 1123 73     Resp 04/11/24 1123 20     Temp 04/11/24 1123 99.4 F (37.4 C)     Temp Source 04/11/24 1123 Axillary     SpO2 04/11/24 1123 100 %     Weight 04/11/24 1122 206 lb (93.4 kg)     Height 04/11/24 1122 6\' 1"  (1.854 m)     Head Circumference --      Peak Flow --      Pain Score 04/11/24 1121 10     Pain Loc --      Pain Education --      Exclude from Growth Chart --     Most recent vital signs: Vitals:   04/11/24 1130 04/11/24 1437  BP: (!) 173/96 (!) 162/74  Pulse: 81 61  Resp: 15 15  Temp:    SpO2: 99% 98%     General: Awake, no distress.  CV:  Good peripheral perfusion.  Resp:  Normal effort.  Clear Abd:  No distention.  Nontender Other:  Superficial wounds with purulent  drainage to his bilateral legs on the medial lateral portion as well as medial thigh on the right, wounds are warm, foul-smelling.  No pain out of proportion, compartments are soft, equal DP pulses bilaterally, no focal weakness or numbness.   ED Results / Procedures / Treatments   Labs (all labs ordered are listed, but only abnormal results are displayed) Labs Reviewed  COMPREHENSIVE METABOLIC PANEL WITH GFR - Abnormal; Notable for the following components:      Result Value   Potassium 3.3 (*)    Glucose, Bld 139 (*)    Total Protein 8.6 (*)    All other components within normal limits  CBC WITH DIFFERENTIAL/PLATELET - Abnormal; Notable for the following components:   WBC 30.2 (*)    Hemoglobin 12.5 (*)    Platelets 456 (*)    Neutro Abs 28.8 (*)    Lymphs Abs 0.5 (*)    Abs Immature Granulocytes 0.27 (*)    All other components within normal limits  BLOOD GAS, VENOUS - Abnormal;  Notable for the following components:   pH, Ven 7.45 (*)    pCO2, Ven 42 (*)    Bicarbonate 29.2 (*)    Acid-Base Excess 4.7 (*)    All other components within normal limits  CULTURE, BLOOD (ROUTINE X 2)  ETHANOL  MAGNESIUM   URINE DRUG SCREEN, QUALITATIVE ([facility] ONLY)  HEPATITIS PANEL, ACUTE  HIV ANTIBODY (ROUTINE TESTING W REFLEX)  TROPONIN I (HIGH SENSITIVITY)  TROPONIN I (HIGH SENSITIVITY)     EKG  EKG shows, sinus rhythm, rate 73, normal QRS, prolonged QTc, no ischemic ST elevation, T wave inversion to 3, this is changed compared to prior   PROCEDURES:  Critical Care performed: Yes, see critical care procedure note(s)  .Critical Care  Performed by: Shane Darling, MD Authorized by: Shane Darling, MD   Critical care provider statement:    Critical care time (minutes):  40   Critical care was necessary to treat or prevent imminent or life-threatening deterioration of the following conditions: Opiate withdrawal.   Critical care was time spent personally by me on the following activities:   Development of treatment plan with patient or surrogate, discussions with consultants, evaluation of patient's response to treatment, examination of patient, ordering and review of laboratory studies, ordering and review of radiographic studies, ordering and performing treatments and interventions, pulse oximetry, re-evaluation of patient's condition and review of old charts    MEDICATIONS ORDERED IN ED: Medications  vancomycin (VANCOREADY) IVPB 2000 mg/400 mL (has no administration in time range)  lactated ringers bolus 1,000 mL (has no administration in time range)  LORazepam  (ATIVAN ) tablet 1 mg (has no administration in time range)  enoxaparin  (LOVENOX ) injection 40 mg (has no administration in time range)  sodium chloride  flush (NS) 0.9 % injection 3 mL (has no administration in time range)  acetaminophen  (TYLENOL ) tablet 650 mg (has no administration in time range)    Or  acetaminophen  (TYLENOL ) suppository 650 mg (has no administration in time range)  ondansetron  (ZOFRAN ) tablet 4 mg (has no administration in time range)    Or  ondansetron  (ZOFRAN ) injection 4 mg (has no administration in time range)  metroNIDAZOLE (FLAGYL) IVPB 500 mg (has no administration in time range)  lactated ringers infusion (has no administration in time range)  buprenorphine-naloxone (SUBOXONE) 8-2 mg per SL tablet 1 tablet (has no administration in time range)  buprenorphine-naloxone (SUBOXONE) 8-2 mg per SL tablet 1 tablet (1 tablet Sublingual Given 04/11/24 1143)  ceFEPIme (MAXIPIME) 2 g in sodium chloride  0.9 % 100 mL IVPB (0 g Intravenous Stopped 04/11/24 1302)  buprenorphine-naloxone (SUBOXONE) 8-2 mg per SL tablet 1 tablet (1 tablet Sublingual Given 04/11/24 1232)  buprenorphine-naloxone (SUBOXONE) 8-2 mg per SL tablet 1 tablet (1 tablet Sublingual Given 04/11/24 1359)  gabapentin (NEURONTIN) capsule 300 mg (300 mg Oral Given 04/11/24 1400)  cloNIDine  (CATAPRES ) tablet 0.1 mg (0.1 mg Oral Given 04/11/24 1400)   ondansetron  (ZOFRAN -ODT) disintegrating tablet 4 mg (4 mg Oral Given 04/11/24 1359)     IMPRESSION / MDM / ASSESSMENT AND PLAN / ED COURSE  I reviewed the triage vital signs and the nursing notes.                              Differential diagnosis includes, but is not limited to, opiate withdrawal, early gastroenteritis, wound infection, electrolyte derangements, dehydration.  Will get labs, EKG, troponin, blood cultures, IV antibiotics, Suboxone.  Patient's presentation is most consistent with acute  presentation with potential threat to life or bodily function.  Independent interpretation of labs and imaging below.  Assessment patient having nausea vomiting, trying to walk out of his room, fell on the ground, he is not altered.  Will give him another dose of Suboxone here as well as clonidine  and gabapentin.  Consult to hospitalist for admission given his wounds as well as his opiate withdrawal.  He is admitted.  The patient is on the cardiac monitor to evaluate for evidence of arrhythmia and/or significant heart rate changes.       FINAL CLINICAL IMPRESSION(S) / ED DIAGNOSES   Final diagnoses:  Opiate withdrawal (HCC)  Wound infection     Rx / DC Orders   ED Discharge Orders     None        Note:  This document was prepared using Dragon voice recognition software and may include unintentional dictation errors.    Shane Darling, MD 04/11/24 [phone]

## 2024-04-11 NOTE — ED Notes (Signed)
 Dr Drenda Gentle aware of pt difficult IV access. IV Team consult placed.

## 2024-04-11 NOTE — Assessment & Plan Note (Addendum)
 Patient has a long-term history of opioid use disorder, previously treated with methadone.  He was treated with some oxygen in the ED, so we will continue at this time.  Given active withdrawal, suspect he will need additional agents including clonidine  +/- gabapentin.  - S/p Suboxone 8-2 mg x 3 - Continue Suboxone 3 times daily starting this evening - Clonidine  0.1 mg twice daily - Zofran  and Imodium as needed - Telemetry monitoring for QTc prolongation - Check HIV and hepatitis panel

## 2024-04-11 NOTE — Consult Note (Signed)
 ED Pharmacy Antibiotic Sign Off An antibiotic consult was received from an ED provider for Vancomycin and Cefepime per pharmacy dosing for wound infection. A chart review was completed to assess appropriateness.   The following one time order(s) were placed:  Vancomycin 2g IV and Cefepime 2g IV.  Further antibiotic and/or antibiotic pharmacy consults should be ordered by the admitting provider if indicated.   Thank you for allowing pharmacy to be a part of this patient's care.   Jadalee Westcott A Posey Jasmin, [facility]  Clinical Pharmacist 04/11/24 11:49 AM

## 2024-04-11 NOTE — Assessment & Plan Note (Addendum)
 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  Difficult to say if these appear significantly worse compared to prior, however given increased warmth/TTP with severe leukocytosis, will treat with vancomycin.  Will provide additional anaerobic coverage with Flagyl.  - Vancomycin per pharmacy dosing - Flagyl 500 mg IV twice daily - Repeat CBC in the a.m. - Wound care consult - Blood cultures

## 2024-04-11 NOTE — ED Notes (Signed)
 RN at desk and heard a loud noise. Pt to be found in hall way laying in the prone position. Associate Professor notified. Pt lifted off the floor and put on stretcher. RN notified primary RN. Provider at bedside.

## 2024-04-12 ENCOUNTER — Observation Stay: Admit: 2024-04-12

## 2024-04-12 DIAGNOSIS — R7881 Bacteremia: Secondary | ICD-10-CM | POA: Diagnosis present

## 2024-04-12 DIAGNOSIS — R011 Cardiac murmur, unspecified: Secondary | ICD-10-CM | POA: Diagnosis present

## 2024-04-12 DIAGNOSIS — F17290 Nicotine dependence, other tobacco product, uncomplicated: Secondary | ICD-10-CM | POA: Diagnosis present

## 2024-04-12 DIAGNOSIS — B9562 Methicillin resistant Staphylococcus aureus infection as the cause of diseases classified elsewhere: Secondary | ICD-10-CM

## 2024-04-12 DIAGNOSIS — A419 Sepsis, unspecified organism: Secondary | ICD-10-CM | POA: Diagnosis present

## 2024-04-12 DIAGNOSIS — Z8249 Family history of ischemic heart disease and other diseases of the circulatory system: Secondary | ICD-10-CM | POA: Diagnosis not present

## 2024-04-12 DIAGNOSIS — Z5329 Procedure and treatment not carried out because of patient's decision for other reasons: Secondary | ICD-10-CM | POA: Diagnosis present

## 2024-04-12 DIAGNOSIS — F1123 Opioid dependence with withdrawal: Secondary | ICD-10-CM | POA: Diagnosis present

## 2024-04-12 DIAGNOSIS — Z9081 Acquired absence of spleen: Secondary | ICD-10-CM | POA: Diagnosis not present

## 2024-04-12 DIAGNOSIS — L97919 Non-pressure chronic ulcer of unspecified part of right lower leg with unspecified severity: Secondary | ICD-10-CM | POA: Diagnosis present

## 2024-04-12 DIAGNOSIS — Z8614 Personal history of Methicillin resistant Staphylococcus aureus infection: Secondary | ICD-10-CM

## 2024-04-12 DIAGNOSIS — F458 Other somatoform disorders: Secondary | ICD-10-CM | POA: Diagnosis present

## 2024-04-12 DIAGNOSIS — L0390 Cellulitis, unspecified: Secondary | ICD-10-CM

## 2024-04-12 DIAGNOSIS — Z79899 Other long term (current) drug therapy: Secondary | ICD-10-CM | POA: Diagnosis not present

## 2024-04-12 DIAGNOSIS — T148XXA Other injury of unspecified body region, initial encounter: Secondary | ICD-10-CM | POA: Diagnosis present

## 2024-04-12 DIAGNOSIS — L97929 Non-pressure chronic ulcer of unspecified part of left lower leg with unspecified severity: Secondary | ICD-10-CM | POA: Diagnosis present

## 2024-04-12 DIAGNOSIS — Z833 Family history of diabetes mellitus: Secondary | ICD-10-CM | POA: Diagnosis not present

## 2024-04-12 HISTORY — DX: Sepsis, unspecified organism: A41.9

## 2024-04-12 LAB — BLOOD CULTURE ID PANEL (REFLEXED) - BCID2

## 2024-04-12 LAB — BASIC METABOLIC PANEL WITH GFR
Anion gap: 13 (ref 5–15)
BUN: 10 mg/dL (ref 6–20)
CO2: 25 mmol/L (ref 22–32)
Calcium: 9.5 mg/dL (ref 8.9–10.3)
Chloride: 99 mmol/L (ref 98–111)
Creatinine, Ser: 0.93 mg/dL (ref 0.61–1.24)
GFR, Estimated: >60 mL/min (ref >60)
Glucose, Bld: 125 mg/dL — ABNORMAL HIGH (ref 70–99)
Potassium: 3.5 mmol/L (ref 3.5–5.1)
Sodium: 137 mmol/L (ref 135–145)

## 2024-04-12 LAB — CBC
HCT: 40.4 % (ref 39.0–52.0)
Hemoglobin: 12.9 g/dL — ABNORMAL LOW (ref 13.0–17.0)
MCH: 28.1 pg (ref 26.0–34.0)
MCHC: 31.9 g/dL (ref 30.0–36.0)
MCV: 88 fL (ref 80.0–100.0)
Platelets: 489 10*3/uL — ABNORMAL HIGH (ref 150–400)
RBC: 4.59 MIL/uL (ref 4.22–5.81)
RDW: 14.7 % (ref 11.5–15.5)
WBC: 34.9 10*3/uL — ABNORMAL HIGH (ref 4.0–10.5)
nRBC: 0 % (ref 0.0–0.2)

## 2024-04-12 LAB — BLOOD GAS, VENOUS
Bicarbonate: 29.2 mmol/L — ABNORMAL HIGH (ref 20.0–28.0)
Patient temperature: 37 mmol/L — ABNORMAL HIGH (ref 0.0–2.0)
pCO2, Ven: 42 mmHg — ABNORMAL LOW (ref 44–60)
pH, Ven: 7.45 — ABNORMAL HIGH (ref 7.25–7.43)
pO2, Ven: 29.2 mmol/L — ABNORMAL HIGH (ref 32–45)

## 2024-04-12 LAB — HIV ANTIBODY (ROUTINE TESTING W REFLEX): HIV Screen 4th Generation wRfx: NONREACTIVE

## 2024-04-12 NOTE — Progress Notes (Signed)
 Patient left AMA. Charge RN provided [employer] paperwork, and the provider was notified.

## 2024-04-12 NOTE — Discharge Instructions (Signed)
Addiction and Co-Occurring Disorders   The [facility], Intensive Outpatient Program is held three afternoons from 1 to 4pm; Monday, Wednesday and Friday allowing patients the opportunity to participate in a treatment program in the day while attending A meetings in the evening. W e provide educational lectures and films, group therapy, individual counseling and family counseling.  A multidisciplinary team of the [facility] healthcare professionals, headed by the Medical Director, designs an individualized treatment program to suit the needs of each patient. The length of stay in this program is determined by the goals and objectives set by the treatment team, but usually is 6 to 10 weeks. Direct admission to IOP is possible after an individual evaluation by our assessment team. Chemical usage patterns, the progressionof disease in the individual, the possible need for detoxification and the availability of an external support system are factors in determining who is appropriate for. direct OP admission. Each person has the opportunity to embrace a lifestyle of on-going recovery, family involvement and attending a 12 Step Support group. Program Objectives Include  Chemicaldependency education Abstinence for all mind-altering chemicals 12 Step support Relapse Prevention Planning Improved communication skills Spirituality and personal fulfilment Mondav-Wednesday-Friday 1:00pm to 4:00pm Individual and Family Sessions are set by the Counselor [place], [place], [place] Licensed Clinical Addictions Spcialist [phone]    [facility]enc[facility] Intensive OutpatientPrograms Alcohol & Drug Services (ADS) [address], [place] [address] [place]: [phone] [place]: [place]: [place]: [phone] Both daytime & evening counseling available Accepts Medicaid and other insurances  [facility] four  times a week for 2 1?2 -3 hours each session Both daytime & evening counseling available Accepts Medicaid and other insurances  [facility] CD-IOP - Outpatient Services [address] [place] [address] [phone] Meets 3 days a week for 4 hours Accepts Medicare and other insurances (not Medicaid)  [place].: Substance [place] [address] [place] [address] [phone]  These referrals have been provided to you as appropriate for your clinical needs while taking into account your financial concerns. Please be aware that agencies, practitioners and insurance companies sometimes change contracts. When calling to make an appointment have your insurance information available so the professional you aregoing to see can confirm whether they are covered by your plan. Take this form with you in case the person you are seeing needs a copy or to contact us. Assessment Counselor   [facility] Intensive Outpatient Program [place], [place] [address] [phone] Private insurance, Medicare A&B, and [facility] ADS (Alcohol and Drug Services) [address] [place], [place], [place] [address] [phone] Medicaid, Self Pay  [place] [address] [place], Self Pay  [facility] IOP and Partial Hospitalization Program [place]. [place], [place] [address] [phone] Private Insurance, [place] only for partial [facility] [place]. [place], [place] Private Insurance and Self Pay  [facility] '[address] [place] [address] [place], [place], and Self Pay.  Medical laboratory scientific officer (Partial Hospitalization) [place]  [address] [place] [address] Private Insurance, [place], and Self Pay  [facility] [address] [place], [place], [place] [address] [phone] [place]  [place], [place] [address] Phone: [phone] or [phone] [place], [place] [address] [phone] Medicaid, private insurance [facility] [address] [place], [place] [address] [phone] or [phone] Self Pay only, sliding scale  [facility] [address] [place], [place] [address] [phone] State funds for uninsured [place]. [place], [place] Private Insurance, and Self pay [employer], some Medicaid Crisis Mobile: [facility]: [phone] (for crisis response 24 hours a day) [place] [phone]   [place]. [place], [place], and [place] Programs [address] [place], [place], and [place]. [facility]: [phone] ASAP: Adolescent Substance Abuse Program Males ages: 12-17 ASAP: Adolescent Substance abuse Program Females: 12-17 [facility] [employer] Structured Day Program [phone] CrisisMobile: [facility]: [phone] (for crisis response 24 hours a day) [facility] [phone]
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
## 2024-04-12 NOTE — Progress Notes (Signed)
 PHARMACY - PHYSICIAN COMMUNICATION CRITICAL VALUE ALERT - BLOOD CULTURE IDENTIFICATION (BCID)  [name] is an 43 y.o. male who presented to [facility] on 04/11/2024 with a chief complaint of infected ulcer of sking.   Assessment:  MRSA in 1 of 4 bottles (include suspected source if known)  Name of physician (or Provider) Contacted: Mansy   Current antibiotics: Vanc, metronidazole   Changes to prescribed antibiotics recommended:  Recommendations accepted by provider - will d/c metronidazole and continue Vancomycin   Results for orders placed or performed during the hospital encounter of 04/11/24  Blood Culture ID Panel (Reflexed) (Collected: 04/11/2024 12:17 PM)  Result Value Ref Range   Enterococcus faecalis NOT DETECTED NOT DETECTED   Enterococcus Faecium NOT DETECTED NOT DETECTED   Listeria monocytogenes NOT DETECTED NOT DETECTED   Staphylococcus species DETECTED (A) NOT DETECTED   Staphylococcus aureus (BCID) DETECTED (A) NOT DETECTED   Staphylococcus epidermidis NOT DETECTED NOT DETECTED   Staphylococcus lugdunensis NOT DETECTED NOT DETECTED   Streptococcus species NOT DETECTED NOT DETECTED   Streptococcus agalactiae NOT DETECTED NOT DETECTED   Streptococcus pneumoniae NOT DETECTED NOT DETECTED   Streptococcus pyogenes NOT DETECTED NOT DETECTED   A.calcoaceticus-baumannii NOT DETECTED NOT DETECTED   Bacteroides fragilis NOT DETECTED NOT DETECTED   Enterobacterales NOT DETECTED NOT DETECTED   Enterobacter cloacae complex NOT DETECTED NOT DETECTED   Escherichia coli NOT DETECTED NOT DETECTED   Klebsiella aerogenes NOT DETECTED NOT DETECTED   Klebsiella oxytoca NOT DETECTED NOT DETECTED   Klebsiella pneumoniae NOT DETECTED NOT DETECTED   Proteus species NOT DETECTED NOT DETECTED   Salmonella species NOT DETECTED NOT DETECTED   Serratia marcescens NOT DETECTED NOT DETECTED   Haemophilus influenzae NOT DETECTED NOT DETECTED   Neisseria meningitidis NOT DETECTED NOT DETECTED    Pseudomonas aeruginosa NOT DETECTED NOT DETECTED   Stenotrophomonas maltophilia NOT DETECTED NOT DETECTED   Candida albicans NOT DETECTED NOT DETECTED   Candida auris NOT DETECTED NOT DETECTED   Candida glabrata NOT DETECTED NOT DETECTED   Candida krusei NOT DETECTED NOT DETECTED   Candida parapsilosis NOT DETECTED NOT DETECTED   Candida tropicalis NOT DETECTED NOT DETECTED   Cryptococcus neoformans/gattii NOT DETECTED NOT DETECTED   Meth resistant mecA/C and MREJ DETECTED (A) NOT DETECTED    Palestine Mosco D 04/12/2024  6:00 AM

## 2024-04-12 NOTE — Discharge Summary (Signed)
 Physician Discharge Summary   Patient: [name] MRN: [id number] DOB: [birth year]  Admit date:     04/11/2024  Discharge date: 04/12/24  Discharge Physician: Ezzard Holms   PCP: Leopoldo Rancher, PA   Recommendations at discharge:  Patient left AMA and not interested in medications or any follow-up appointments  Discharge Diagnoses: Infected ulcer of skin (HCC) Opioid use disorder Systolic murmur  Hospital Course: From HPI "[name] is a 43 y.o. male with medical history significant of opioid use disorder with intravenous fentanyl use, psychogenic formication, who presents to the ED due to opioid withdrawal.    History limited as patient is not wanting to participate in discussion at this time.  [name] mother provides collateral history.  She went to go check on him today in his room and noted that he was in active withdrawal with needles surrounding him.  She also noted his lower extremity wounds and was concerned that they have significantly worsened.  She notes that these wounds have been present for at least 1 year.  When asked [name] if he injects into the wounds or picks at them, he nods his head.   ED course: On arrival to the ED, patient was hypertensive at 173/96 with heart rate of 74.  He was saturating at 99% on room air.  He was afebrile at 99.4.Initial workup notable for WBC of 30.2, hemoglobin 12.5, platelets 456, and unremarkable CMP.  VBG with pH of 7.45 and pCO2 of 42.  Alcohol level negative.  Patient started on cefepime, Flagyl, vancomycin, Suboxone x 3, clonidine , gabapentin, Ativan , and IV fluids.  [facility] contacted for admission.  [place]  Patient was initiated on broad-spectrum antibiotics and was seen by infectious disease.  Patient did not stay to be treated and decided to leave AGAINST MEDICAL ADVICE.  Patient did not wish for any medications and was not interested in any follow-up appointment   Consultants: ID Procedures performed:  None Disposition: Home Diet recommendation:  Cardiac diet DISCHARGE MEDICATION:  Patient did not wish to fill his medications or any follow-up appointment but rather left AGAINST MEDICAL ADVICE  Discharge Exam: Filed Weights   04/11/24 1122  Weight: 93.4 kg   Constitutional:      Appearance: He is ill-appearing.  HENT:     Head: Normocephalic and atraumatic.  Eyes:     Conjunctiva/sclera: Conjunctivae normal.     Pupils: Pupils are equal, round, and reactive to light.  Cardiovascular:     Rate and Rhythm: Normal rate and regular rhythm.     Heart sounds: Murmur (3/6 holosystolic murmur) heard.     No gallop.  Pulmonary:     Effort: Pulmonary effort is normal. No respiratory distress.     Breath sounds: Normal breath sounds. No wheezing, rhonchi or rales.  Abdominal:     General: Bowel sounds are normal. There is no distension.     Palpations: Abdomen is soft.     Tenderness: There is no abdominal tenderness.  Skin:    General: Skin is warm and dry.     Comments: Multiple ulcers of the lower extremities, both on the medial and lateral aspect.  Please check media section for pictures Neurological:     Mental Status: He is alert.  Fully alert and oriented  Condition at discharge: fair  The results of significant diagnostics from this hospitalization (including imaging, microbiology, ancillary and laboratory) are listed below for reference.   Imaging Studies: No results found.  Microbiology: Results  for orders placed or performed during the hospital encounter of 04/11/24  Culture, blood (routine x 2)     Status: None (Preliminary result)   Collection Time: 04/11/24 12:17 PM   Specimen: BLOOD  Result Value Ref Range Status   Specimen Description   Final    BLOOD BLOOD LEFT ARM Performed at [facility], [address] [place], [place] [address]    Special Requests   Final    BOTTLES DRAWN AEROBIC AND ANAEROBIC Blood Culture results may not be optimal due  to an inadequate volume of blood received in culture bottles Performed at [facility], [address], [place], [place] [address]    Culture  Setup Time   Final    GRAM POSITIVE COCCI IN BOTH AEROBIC AND ANAEROBIC BOTTLES CRITICAL RESULT CALLED TO, READ BACK BY AND VERIFIED WITH: JASON ROBBINS 04/12/24 0543 MW Performed at [facility], [address], [place], [place] [address]    Culture GRAM POSITIVE COCCI  Final   Report Status PENDING  Incomplete  Blood Culture ID Panel (Reflexed)     Status: Abnormal   Collection Time: 04/11/24 12:17 PM  Result Value Ref Range Status   Enterococcus faecalis NOT DETECTED NOT DETECTED Final   Enterococcus Faecium NOT DETECTED NOT DETECTED Final   Listeria monocytogenes NOT DETECTED NOT DETECTED Final   Staphylococcus species DETECTED (A) NOT DETECTED Final    Comment: CRITICAL RESULT CALLED TO, READ BACK BY AND VERIFIED WITH: JASON ROBBINS 04/12/24 0544 MW    Staphylococcus aureus (BCID) DETECTED (A) NOT DETECTED Final    Comment: Methicillin (oxacillin)-resistant Staphylococcus aureus (MRSA). MRSA is predictably resistant to beta-lactam antibiotics (except ceftaroline). Preferred therapy is vancomycin unless clinically contraindicated. Patient requires contact precautions if  hospitalized. CRITICAL RESULT CALLED TO, READ BACK BY AND VERIFIED WITH: JASON ROBBINS 04/12/24 0544 MW    Staphylococcus epidermidis NOT DETECTED NOT DETECTED Final   Staphylococcus lugdunensis NOT DETECTED NOT DETECTED Final   Streptococcus species NOT DETECTED NOT DETECTED Final   Streptococcus agalactiae NOT DETECTED NOT DETECTED Final   Streptococcus pneumoniae NOT DETECTED NOT DETECTED Final   Streptococcus pyogenes NOT DETECTED NOT DETECTED Final   A.calcoaceticus-baumannii NOT DETECTED NOT DETECTED Final   Bacteroides fragilis NOT DETECTED NOT DETECTED Final   Enterobacterales NOT DETECTED NOT DETECTED Final   Enterobacter cloacae complex NOT DETECTED  NOT DETECTED Final   Escherichia coli NOT DETECTED NOT DETECTED Final   Klebsiella aerogenes NOT DETECTED NOT DETECTED Final   Klebsiella oxytoca NOT DETECTED NOT DETECTED Final   Klebsiella pneumoniae NOT DETECTED NOT DETECTED Final   Proteus species NOT DETECTED NOT DETECTED Final   Salmonella species NOT DETECTED NOT DETECTED Final   Serratia marcescens NOT DETECTED NOT DETECTED Final   Haemophilus influenzae NOT DETECTED NOT DETECTED Final   Neisseria meningitidis NOT DETECTED NOT DETECTED Final   Pseudomonas aeruginosa NOT DETECTED NOT DETECTED Final   Stenotrophomonas maltophilia NOT DETECTED NOT DETECTED Final   Candida albicans NOT DETECTED NOT DETECTED Final   Candida auris NOT DETECTED NOT DETECTED Final   Candida glabrata NOT DETECTED NOT DETECTED Final   Candida krusei NOT DETECTED NOT DETECTED Final   Candida parapsilosis NOT DETECTED NOT DETECTED Final   Candida tropicalis NOT DETECTED NOT DETECTED Final   Cryptococcus neoformans/gattii NOT DETECTED NOT DETECTED Final   Meth resistant mecA/C and MREJ DETECTED (A) NOT DETECTED Final    Comment: CRITICAL RESULT CALLED TO, READ BACK BY AND VERIFIED WITH: JASON ROBBINS  04/12/24 0544 MW Performed at [facility], [address] [place], [place] [address]   Culture, blood (Routine X 2) w Reflex to ID Panel     Status: None (Preliminary result)   Collection Time: 04/11/24  7:05 PM   Specimen: BLOOD  Result Value Ref Range Status   Specimen Description BLOOD LEFT ANTECUBITAL  Final   Special Requests   Final    BOTTLES DRAWN AEROBIC AND ANAEROBIC Blood Culture adequate volume   Culture   Final    NO GROWTH < 12 HOURS Performed at [facility], [address] [place], [place] [address]    Report Status PENDING  Incomplete    Labs: CBC: Recent Labs  Lab 04/11/24 1217 04/12/24 0405  WBC 30.2* 34.9*  NEUTROABS 28.8*  --   HGB 12.5* 12.9*  HCT 39.1 40.4  MCV 88.5 88.0  PLT 456* 489*   Basic  Metabolic Panel: Recent Labs  Lab 04/11/24 1217 04/12/24 0405  NA 138 137  K 3.3* 3.5  CL 98 99  CO2 27 25  GLUCOSE 139* 125*  BUN 8 10  CREATININE 1.02 0.93  CALCIUM  9.5 9.5  MG 1.8  --    Liver Function Tests: Recent Labs  Lab 04/11/24 1217  AST 25  ALT 13  ALKPHOS 76  BILITOT 1.0  PROT 8.6*  ALBUMIN 4.0   CBG: No results for input(s): "GLUCAP" in the last 168 hours.  Discharge time spent:  40 minutes.  Signed: Ezzard Holms, MD [facility] 04/12/2024

## 2024-04-12 NOTE — Care Management Obs Status (Signed)
 MEDICARE OBSERVATION STATUS NOTIFICATION   Patient Details  Name: [name] MRN: [id number] Date of Birth: 1981/01/22   Medicare Observation Status Notification Given:  Yes    Anise Kerns 04/12/2024, 11:51 AM

## 2024-04-12 NOTE — Progress Notes (Signed)
 Patient called his mom and signed out against medical advice. IV removed without complications.  [name] V [name]

## 2024-04-12 NOTE — TOC CM/SW Note (Signed)
 Transition of Care [facility]) - Inpatient Brief Assessment   Patient Details  Name: [name] MRN: [id number] Date of Birth: [birth year]  Transition of Care [facility]) CM/SW Contact:    Loman Risk, RN Phone Number: 04/12/2024, 12:20 PM   Clinical Narrative:   Transition of Care [facility] Screening Note   Patient Details  Name: [name] Date of Birth: [birth year]   Transition of Care [facility]) CM/SW Contact:    Loman Risk, RN Phone Number: 04/12/2024, 12:20 PM    Transition of Care Department [facility]) has reviewed patient and no TOC needs have been identified at this time. If new patient transition needs arise, please place a TOC consult.  -Substance abuse resources added to AVS - Per WOC RN "Patient would benefit from ongoing management of these wounds at wound care center" - If home health indicated for wound care, patient would not be eligible due to active on going IV drug use, and noted by EMS that patient had "hundreds of exposed needles in his room "  Transition of Care Asessment: Insurance and Status: Insurance coverage has been reviewed Patient has primary care physician: Yes     Prior/Current Home Services: No current home services Social Drivers of Health Review: SDOH reviewed no interventions necessary Readmission risk has been reviewed: Yes

## 2024-04-12 NOTE — Plan of Care (Signed)

## 2024-04-14 ENCOUNTER — Other Ambulatory Visit: Payer: Self-pay

## 2024-04-14 ENCOUNTER — Inpatient Hospital Stay
Admission: EM | Admit: 2024-04-14 | Discharge: 2024-04-17 | DRG: 872 | Discharge status: Against Medical Advice | Attending: Hospitalist | Admitting: Hospitalist

## 2024-04-14 ENCOUNTER — Telehealth: Payer: Self-pay | Admitting: Infectious Diseases

## 2024-04-14 ENCOUNTER — Emergency Department

## 2024-04-14 DIAGNOSIS — A419 Sepsis, unspecified organism: Principal | ICD-10-CM

## 2024-04-14 DIAGNOSIS — L97929 Non-pressure chronic ulcer of unspecified part of left lower leg with unspecified severity: Secondary | ICD-10-CM | POA: Diagnosis present

## 2024-04-14 DIAGNOSIS — Z1152 Encounter for screening for COVID-19: Secondary | ICD-10-CM

## 2024-04-14 DIAGNOSIS — Z833 Family history of diabetes mellitus: Secondary | ICD-10-CM

## 2024-04-14 DIAGNOSIS — F1120 Opioid dependence, uncomplicated: Secondary | ICD-10-CM | POA: Diagnosis present

## 2024-04-14 DIAGNOSIS — N179 Acute kidney failure, unspecified: Secondary | ICD-10-CM | POA: Diagnosis present

## 2024-04-14 DIAGNOSIS — Z8249 Family history of ischemic heart disease and other diseases of the circulatory system: Secondary | ICD-10-CM | POA: Diagnosis not present

## 2024-04-14 DIAGNOSIS — L97919 Non-pressure chronic ulcer of unspecified part of right lower leg with unspecified severity: Secondary | ICD-10-CM | POA: Diagnosis present

## 2024-04-14 DIAGNOSIS — Z5329 Procedure and treatment not carried out because of patient's decision for other reasons: Secondary | ICD-10-CM | POA: Diagnosis present

## 2024-04-14 DIAGNOSIS — R7881 Bacteremia: Principal | ICD-10-CM

## 2024-04-14 DIAGNOSIS — B9562 Methicillin resistant Staphylococcus aureus infection as the cause of diseases classified elsewhere: Secondary | ICD-10-CM

## 2024-04-14 DIAGNOSIS — I959 Hypotension, unspecified: Secondary | ICD-10-CM | POA: Diagnosis present

## 2024-04-14 DIAGNOSIS — Z8614 Personal history of Methicillin resistant Staphylococcus aureus infection: Secondary | ICD-10-CM | POA: Diagnosis present

## 2024-04-14 DIAGNOSIS — R6521 Severe sepsis with septic shock: Principal | ICD-10-CM

## 2024-04-14 LAB — CBC WITH DIFFERENTIAL/PLATELET
Abs Immature Granulocytes: 0.03 10*3/uL (ref 0.00–0.07)
Basophils Absolute: 0.1 10*3/uL (ref 0.0–0.1)
Basophils Relative: 0 %
Eosinophils Absolute: 0.1 10*3/uL (ref 0.0–0.5)
Eosinophils Relative: 1 %
HCT: 35.7 % — ABNORMAL LOW (ref 39.0–52.0)
Hemoglobin: 11.1 g/dL — ABNORMAL LOW (ref 13.0–17.0)
Immature Granulocytes: 0 %
Lymphocytes Relative: 25 %
Lymphs Abs: 3 10*3/uL (ref 0.7–4.0)
MCH: 28 pg (ref 26.0–34.0)
MCHC: 31.1 g/dL (ref 30.0–36.0)
MCV: 89.9 fL (ref 80.0–100.0)
Monocytes Absolute: 1.2 10*3/uL — ABNORMAL HIGH (ref 0.1–1.0)
Monocytes Relative: 10 %
Neutro Abs: 7.5 10*3/uL (ref 1.7–7.7)
Neutrophils Relative %: 64 %
Platelets: 395 10*3/uL (ref 150–400)
RBC: 3.97 MIL/uL — ABNORMAL LOW (ref 4.22–5.81)
RDW: 14.1 % (ref 11.5–15.5)
Smear Review: NORMAL
WBC: 11.9 10*3/uL — ABNORMAL HIGH (ref 4.0–10.5)
nRBC: 0 % (ref 0.0–0.2)

## 2024-04-14 LAB — URINALYSIS, W/ REFLEX TO CULTURE (INFECTION SUSPECTED)
Bilirubin Urine: NEGATIVE
Glucose, UA: NEGATIVE mg/dL
Hgb urine dipstick: NEGATIVE
Ketones, ur: NEGATIVE mg/dL
Leukocytes,Ua: NEGATIVE
Nitrite: NEGATIVE
Protein, ur: NEGATIVE mg/dL
Specific Gravity, Urine: 1.012 (ref 1.005–1.030)
pH: 5 (ref 5.0–8.0)

## 2024-04-14 LAB — COMPREHENSIVE METABOLIC PANEL WITH GFR
ALT: 14 U/L (ref 0–44)
AST: 17 U/L (ref 15–41)
Albumin: 3.2 g/dL — ABNORMAL LOW (ref 3.5–5.0)
Alkaline Phosphatase: 58 U/L (ref 38–126)
Anion gap: 11 (ref 5–15)
BUN: 40 mg/dL — ABNORMAL HIGH (ref 6–20)
CO2: 27 mmol/L (ref 22–32)
Calcium: 8.6 mg/dL — ABNORMAL LOW (ref 8.9–10.3)
Chloride: 98 mmol/L (ref 98–111)
Creatinine, Ser: 2.15 mg/dL — ABNORMAL HIGH (ref 0.61–1.24)
GFR, Estimated: 38 mL/min — ABNORMAL LOW (ref >60)
Glucose, Bld: 129 mg/dL — ABNORMAL HIGH (ref 70–99)
Potassium: 3.7 mmol/L (ref 3.5–5.1)
Sodium: 136 mmol/L (ref 135–145)
Total Bilirubin: 0.4 mg/dL (ref 0.0–1.2)
Total Protein: 7.4 g/dL (ref 6.5–8.1)

## 2024-04-14 LAB — RESP PANEL BY RT-PCR (RSV, FLU A&B, COVID)  RVPGX2
Influenza A by PCR: NEGATIVE
Influenza B by PCR: NEGATIVE
Resp Syncytial Virus by PCR: NEGATIVE
SARS Coronavirus 2 by RT PCR: NEGATIVE

## 2024-04-14 LAB — PROTIME-INR
INR: 1.1 (ref 0.8–1.2)
Prothrombin Time: 14.2 s (ref 11.4–15.2)

## 2024-04-14 LAB — LACTIC ACID, PLASMA
Lactic Acid, Venous: 2.2 mmol/L (ref 0.5–1.9)
Lactic Acid, Venous: 1.7 mmol/L (ref 0.5–1.9)

## 2024-04-14 LAB — TROPONIN I (HIGH SENSITIVITY)
Troponin I (High Sensitivity): 11 ng/L (ref <18)
Troponin I (High Sensitivity): 10 ng/L (ref <18)

## 2024-04-14 MED ORDER — LACTATED RINGERS IV BOLUS
1000.0000 mL | Freq: Once | INTRAVENOUS | Status: AC
  Administered 2024-04-14: 1000 mL via INTRAVENOUS

## 2024-04-14 MED ORDER — BISACODYL 5 MG PO TBEC: 5.0000 mg | DELAYED_RELEASE_TABLET | Freq: Every day | ORAL | Status: DC | PRN

## 2024-04-14 MED ORDER — ONDANSETRON HCL 4 MG/2ML IJ SOLN: 4.0000 mg | Freq: Four times a day (QID) | INTRAMUSCULAR | Status: DC | PRN

## 2024-04-14 MED ORDER — SODIUM CHLORIDE 0.9 % IV SOLN
2.0000 g | Freq: Once | INTRAVENOUS | Status: AC
  Administered 2024-04-14: 2 g via INTRAVENOUS
  Filled 2024-04-14: qty 20

## 2024-04-14 MED ORDER — ONDANSETRON HCL 4 MG PO TABS: 4.0000 mg | ORAL_TABLET | Freq: Four times a day (QID) | ORAL | Status: DC | PRN

## 2024-04-14 MED ORDER — ACETAMINOPHEN 650 MG RE SUPP: 650.0000 mg | Freq: Four times a day (QID) | RECTAL | Status: DC | PRN

## 2024-04-14 MED ORDER — VANCOMYCIN HCL 1250 MG/250ML IV SOLN
1250.0000 mg | Freq: Once | INTRAVENOUS | Status: AC
  Administered 2024-04-14: 1250 mg via INTRAVENOUS
  Filled 2024-04-14: qty 250

## 2024-04-14 MED ORDER — NICOTINE 7 MG/24HR TD PT24: 7.0000 mg | MEDICATED_PATCH | Freq: Every day | TRANSDERMAL | Status: DC | PRN

## 2024-04-14 MED ORDER — ACETAMINOPHEN 325 MG PO TABS
650.0000 mg | ORAL_TABLET | Freq: Four times a day (QID) | ORAL | Status: DC | PRN
Start: 2024-04-14 — End: 2024-04-17

## 2024-04-14 MED ORDER — ENOXAPARIN SODIUM 40 MG/0.4ML IJ SOSY
40.0000 mg | PREFILLED_SYRINGE | INTRAMUSCULAR | Status: DC
  Administered 2024-04-14 – 2024-04-16 (×3): 40 mg via SUBCUTANEOUS
  Filled 2024-04-14 (×3): qty 0.4

## 2024-04-14 MED ORDER — ALBUTEROL SULFATE (2.5 MG/3ML) 0.083% IN NEBU: 2.5000 mg | INHALATION_SOLUTION | RESPIRATORY_TRACT | Status: DC | PRN

## 2024-04-14 MED ORDER — VANCOMYCIN VARIABLE DOSE PER UNSTABLE RENAL FUNCTION (PHARMACIST DOSING): Status: DC

## 2024-04-14 MED ORDER — POLYETHYLENE GLYCOL 3350 17 G PO PACK
17.0000 g | PACK | Freq: Every day | ORAL | Status: DC
  Administered 2024-04-14: 17 g via ORAL
  Filled 2024-04-14 (×4): qty 1

## 2024-04-14 MED ORDER — VANCOMYCIN HCL IN DEXTROSE 1-5 GM/200ML-% IV SOLN
1000.0000 mg | Freq: Once | INTRAVENOUS | Status: AC
  Administered 2024-04-14: 1000 mg via INTRAVENOUS
  Filled 2024-04-14: qty 200

## 2024-04-14 NOTE — ED Notes (Addendum)
 Successful EJ IV insertion by this RN. Unable to obtain labs at this time from line. Other IV attempts by Gwendloyn Lemming, RN and [employer] were unsuccessful. Lab called to obtain labs due to patient being a difficult stick. Lab informed patient is a sepsis work up.

## 2024-04-14 NOTE — Consult Note (Signed)
 Pharmacy Antibiotic Note  [name] is a 43 y.o. male admitted on 04/14/2024 with bacteremia.  Pharmacy has been consulted for vancomycin dosing.  Plan: Pt received vancomycin  2250 mg total x 1. Scr is elevated will dose by levels.      Temp (24hrs), Avg:98 F (36.7 C), Min:98 F (36.7 C), Max:98 F (36.7 C)  Recent Labs  Lab 04/11/24 1217 04/12/24 0405 04/14/24 1753 04/14/24 1754  WBC 30.2* 34.9*  --  11.9*  CREATININE 1.02 0.93  --  2.15*  LATICACIDVEN  --   --  2.2*  --     Estimated Creatinine Clearance: 50.6 mL/min (A) (by C-G formula based on SCr of 2.15 mg/dL (H)).    No Known Allergies   Microbiology results: 6/3/ BCx: 2 out of 4 bottles (same set) MRSA.    Thank you for allowing pharmacy to be a part of this patient's care.  Trinidad Funk, PharmD, BCPS 04/14/2024 8:01 PM

## 2024-04-14 NOTE — H&P (Addendum)
 History and Physical  [name] ZOX:[id number] DOB: Jan 27, 1981 DOA: 04/14/2024 PCP: Leopoldo Rancher, PA  Chief Complaint: blood cultures positive for MRSA Historian: patient, mother  HPI:  [name] is a 43 y.o. male with a PMH significant for IVDU, opioid dependence, chronic infected skin ulceration, systolic murmur, prediabetes, MRSA bacteremia.  They presented from home to the ED on 04/14/2024 due to the recommendation of Dr. Harwood Lingo, ID to complete his inpatient treatment for MRSA bacteremia.  He presents with his mother and agrees to complete his treatment which was cut short from his recent hospitalization where he received the diagnosis and left AMA on 6/4.  He was being treated for acute opioid withdrawal at that time.  At this time, he denies any withdrawal symptoms and denies significant pain.  No nausea, vomiting, diarrhea, cramping.  He does endorse intermittent transient shortness of breath which is not associated with exertion and self resolves and he is not currently experiencing at the time of our encounter.  In the ED, it was found that they initially hypotensive with systolics in the 60s but asymptomatic.  He received several IV fluid boluses and had normalization of his blood pressures.  Otherwise vital signs are unremarkable.  Significant findings included: LA 2.2>1.7 s/p IVF.  Creatinine 2.15, otherwise unremarkable metabolic panel.  WBC 11.9 (improved from 34.92 days ago and left AMA), Hgb 11.1.  Repeat blood cultures collected on presentation. Chest x-ray positive for right basilar patchy opacities consistent with atelectasis or pneumonia  They were initially treated with ceftriaxone, LR, vancomycin.   Patient was admitted to medicine service for further workup and management of MRSA bacteremia as outlined in detail below.  Assessment/Plan Principal Problem:   MRSA bacteremia   Septic shock-with initial MAPs around 52 but resolved with IVF boluses. He appears to  be hemodynamically stable currently. LA normalized also with fluids, afebrile, leukocytosis improved since last admission.  MRSA bacteremia-blood cultures from prior admission positive for MRSA.  Patient left AMA 6/4 and returns today with the guidance of ID and his mother. - Continue IV vancomycin - Echo ordered-significant systolic murmur appreciated on exam - ID following - follow up repeat cultures  Leg ulcers-POA.  See clinical images for further detail - Continue ceftriaxone - wound care consult  AKI-creatinine at baseline close to 1 and on presentation is 2.15.  Suspect this will be improved with his IV fluids - BMP am  Past Medical History:  Diagnosis Date   Allergy    Opioid abuse (HCC) 03/2019   on-going daily use of IV Fentanyl    No past surgical history on file.   reports that he has never smoked. He has never used smokeless tobacco. He reports current drug use. Drug: IV. He reports that he does not drink alcohol.  No Known Allergies  Family History  Problem Relation Age of Onset   Hypertension Mother    Diabetes Mother     Prior to Admission medications   Medication Sig Start Date End Date Taking? Authorizing Provider  albendazole  (ALBENZA ) 200 MG tablet Take 200 mg by mouth 2 (two) times daily with a meal. Patient not taking: Reported on 04/11/2024 11/22/23   [provider]  cephALEXin  (KEFLEX ) 500 MG capsule Take 500 mg by mouth every 12 (twelve) hours. Patient not taking: Reported on 04/11/2024 01/26/24   [provider]  ciprofloxacin (CIPRO) 250 MG tablet Take 250 mg by mouth 2 (two) times daily. Patient not taking: Reported on 04/11/2024 03/01/24  [provider]  doxycycline  (ADOXA) 100 MG tablet Take 1 tablet (100 mg total) by mouth 2 (two) times daily. Patient not taking: Reported on 04/11/2024 12/06/23   Goodman, Graydon, MD  venlafaxine  XR (EFFEXOR  XR) 75 MG 24 hr capsule Take 1 capsule (75 mg total) by mouth daily with  breakfast. Patient not taking: Reported on 04/11/2024 10/27/23   Leopoldo Rancher, PA   I have personally, briefly reviewed patient's prior medical records in [facility] Link  Objective: Blood pressure 109/73, pulse (!) 48, temperature 98 F (36.7 C), resp. rate 14, SpO2 100%.   Constitutional: NAD, calm, comfortable HEENT: lids and conjunctivae normal. MMM. Posterior pharynx clear of any exudate or lesions. Normal dentition.  Neck: normal, supple, no masses, no thyromegaly Respiratory: CTAB, no wheezing, no crackles. Normal respiratory effort. No accessory muscle use.  Cardiovascular: RRR, significant systolic murmur best appreciated near apex Abdomen: soft, NT, ND, no masses or HSM palpated. Musculoskeletal: No joint deformity upper and lower extremities. Normal muscle tone.  Skin: several scars. Open lesions on bilateral lower legs on lateral and medical sides with clear drainage. Please see clinical images for further detail Neurologic: Alert and oriented x 3. Normal speech. Grossly non-focal exam. PERRL Psychiatric: Normal mood. Congruent affect.  Labs on Admission: I have personally reviewed admission labs and imaging studies  CBC    Component Value Date/Time   WBC 11.9 (H) 04/14/2024 1754   RBC 3.97 (L) 04/14/2024 1754   HGB 11.1 (L) 04/14/2024 1754   HCT 35.7 (L) 04/14/2024 1754   PLT 395 04/14/2024 1754   MCV 89.9 04/14/2024 1754   MCH 28.0 04/14/2024 1754   MCHC 31.1 04/14/2024 1754   RDW 14.1 04/14/2024 1754   LYMPHSABS 3.0 04/14/2024 1754   MONOABS 1.2 (H) 04/14/2024 1754   EOSABS 0.1 04/14/2024 1754   BASOSABS 0.1 04/14/2024 1754   CMP     Component Value Date/Time   NA 136 04/14/2024 1754   NA 138 09/15/2021 0000   K 3.7 04/14/2024 1754   CL 98 04/14/2024 1754   CO2 27 04/14/2024 1754   GLUCOSE 129 (H) 04/14/2024 1754   BUN 40 (H) 04/14/2024 1754   BUN 5 09/15/2021 0000   CREATININE 2.15 (H) 04/14/2024 1754   CALCIUM  8.6 (L) 04/14/2024 1754   PROT 7.4  04/14/2024 1754   ALBUMIN 3.2 (L) 04/14/2024 1754   AST 17 04/14/2024 1754   ALT 14 04/14/2024 1754   ALKPHOS 58 04/14/2024 1754   BILITOT 0.4 04/14/2024 1754   GFRNONAA 38 (L) 04/14/2024 1754   GFRAA >60 03/13/2019 2319    Radiological Exams on Admission: DG Chest Port 1 View Result Date: 04/14/2024 CLINICAL DATA:  Sepsis EXAM: PORTABLE CHEST 1 VIEW COMPARISON:  Chest radiograph dated 02/06/2019 FINDINGS: Mildly low lung volumes. Right basilar patchy opacities. No pleural effusion or pneumothorax. Enlarged cardiomediastinal silhouette. No acute osseous abnormality. Left upper quadrant surgical clips. IMPRESSION: 1. Right basilar patchy opacities, which may represent atelectasis or pneumonia. 2. Cardiomegaly. Electronically Signed   By: Limin  Xu M.D.   On: 04/14/2024 17:58   EKG: Independently reviewed. Sinus bradycardia   DVT prophylaxis: enoxaparin  (LOVENOX ) injection 40 mg Start: 04/14/24 2000   Code Status: full  Family Communication: mom and dad at bedside  Disposition Plan: admit to med-surg  Consults called: ID following   Ree Candy, DO [facility]  04/14/2024, 7:47 PM    To contact the appropriate [facility] Attending or Consulting provider: Check [email or web address] for coverage  from 7pm-7am

## 2024-04-14 NOTE — ED Provider Notes (Signed)
 [facility] Provider Note    None    (approximate)   History   Dizziness   HPI  [name] is a 43 y.o. male with history of opioid use disorder with IV fentanyl who comes in with concerns for dizziness.  Patient was admitted to the hospital from 6/3 until 6/4.  He was noted to have a pretty significant lower extremity wounds.  Patient was started on IV antibiotics by infectious disease and ended up leaving AGAINST MEDICAL ADVICE.  He ended up having a blood culture that was positive for Staph aureus therefore infectious disease called him to have him return to the emergency room.  When patient got here he did report feeling lightheaded.  His blood pressure was noted to be low.  He denies any other symptoms and he states that his wounds have stayed about the same.  He does report injection of IV fentanyl last use yesterday.   Physical Exam   Triage Vital Signs: ED Triage Vitals  Encounter Vitals Group     BP 04/14/24 1700 (!) 78/55     Systolic BP Percentile --      Diastolic BP Percentile --      Pulse Rate 04/14/24 1700 68     Resp 04/14/24 1700 16     Temp 04/14/24 1700 98 F (36.7 C)     Temp src --      SpO2 04/14/24 1700 100 %     Weight --      Height --      Head Circumference --      Peak Flow --      Pain Score 04/14/24 1702 0     Pain Loc --      Pain Education --      Exclude from Growth Chart --     Most recent vital signs: Vitals:   04/14/24 1703 04/14/24 1707  BP: (!) 66/43 (!) 63/46  Pulse:    Resp:    Temp:    SpO2:       General: Awake, no distress.  CV:  Good peripheral perfusion.  Resp:  Normal effort.  Abd:  No distention.  Other:  Patient has multiple chronic ulcerations noted on his lower extremities.  Ace got good distal pulses   ED Results / Procedures / Treatments   Labs (all labs ordered are listed, but only abnormal results are displayed) Labs Reviewed  MINIMUM INHIBITORY CONC. (1 DRUG)      EKG  My interpretation of EKG:  Sinus bradycardia rate of 53 with some minimal ST elevation diffusely that looks more consistent with early repolarization, no T wave inversions, normal intervals  RADIOLOGY I have reviewed the xray personally and interpreted possible basilar opacity versus atelectasis   PROCEDURES:  Critical Care performed: Yes, see critical care procedure note(s)  .1-3 Lead EKG Interpretation  Performed by: Lubertha Rush, MD Authorized by: Lubertha Rush, MD     Interpretation: normal     ECG rate:  60   ECG rate assessment: normal     Rhythm: sinus rhythm     Ectopy: none     Conduction: normal   .Critical Care  Performed by: Lubertha Rush, MD Authorized by: Lubertha Rush, MD   Critical care provider statement:    Critical care time (minutes):  30   Critical care was necessary to treat or prevent imminent or life-threatening deterioration of the following conditions:  Sepsis   Critical  care was time spent personally by me on the following activities:  Development of treatment plan with patient or surrogate, discussions with consultants, evaluation of patient's response to treatment, examination of patient, ordering and review of laboratory studies, ordering and review of radiographic studies, ordering and performing treatments and interventions, pulse oximetry, re-evaluation of patient's condition and review of old charts    MEDICATIONS ORDERED IN ED: Medications  lactated ringers bolus 1,000 mL (has no administration in time range)  vancomycin (VANCOCIN) IVPB 1000 mg/200 mL premix (1,000 mg Intravenous New Bag/Given 04/14/24 1734)    Followed by  vancomycin (VANCOREADY) IVPB 1250 mg/250 mL (has no administration in time range)  cefTRIAXone (ROCEPHIN) 2 g in sodium chloride  0.9 % 100 mL IVPB (2 g Intravenous New Bag/Given 04/14/24 1759)  lactated ringers bolus 1,000 mL (1,000 mLs Intravenous New Bag/Given 04/14/24 1733)     IMPRESSION / MDM /  ASSESSMENT AND PLAN / ED COURSE  I reviewed the triage vital signs and the nursing notes.   Patient's presentation is most consistent with acute presentation with potential threat to life or bodily function.   Patient comes in with positive blood cultures therefore sepsis alert was called.  Patient was given fluid resuscitation and will discuss with ID on what antibiotics they would like patient to be started on.  He did want patient to have echocardiogram as well as repeat blood cultures.  I suspect this is related to his chronic leg wounds from his IV drug use.  I will discuss with hospital team for admission after fluid resuscitation initially patient was hypotensive but responding well to fluids.  I did discuss the case with Dr. Harwood Lingo from infectious disease who did want repeat blood cultures, echocardiogram and can start on vancomycin  Patient's lactate is elevated.  CBC shows stable hemoglobin.  INR normal  6:50 PM Repeat evaluations patient is responding well to fluids blood pressures are now normal heart rates are not tachycardic does not need ICU or pressors at this time will discuss with the hospital team for admission  The patient is on the cardiac monitor to evaluate for evidence of arrhythmia and/or significant heart rate changes.      FINAL CLINICAL IMPRESSION(S) / ED DIAGNOSES   Final diagnoses:  None     Rx / DC Orders   ED Discharge Orders     None        Note:  This document was prepared using Dragon voice recognition software and may include unintentional dictation errors.   Lubertha Rush, MD 04/14/24 [phone]

## 2024-04-14 NOTE — ED Triage Notes (Addendum)
 Pt was called back to ER for the culture coming back + for staph. GCS 15. Pt reports dizziness. Denies SOB, CP. Pt left AMA yesterday. Pt reports last using fentanyl and zyxal yesterday

## 2024-04-14 NOTE — ED Notes (Signed)
 Pt family requested absent note be faxed lawyer to [phone]

## 2024-04-14 NOTE — Consult Note (Signed)
 CODE SEPSIS - PHARMACY COMMUNICATION  **Broad Spectrum Antibiotics should be administered within 1 hour of Sepsis diagnosis**  Time Code Sepsis Called/Page Received: 1719  Antibiotics Ordered: vancomycin + ceftriaxone  Time of 1st antibiotic administration: 1734  Additional action taken by pharmacy: none    Trinidad Funk ,PharmD Clinical Pharmacist  04/14/2024  5:59 PM

## 2024-04-14 NOTE — Telephone Encounter (Signed)
 I called pt and family. Tried to call yesterday. Finally reached patient to discuss the life threatening nature of his infection and that he needs to be readmitted. He has agreed to go "later on".  I encouraged him to come in to ED to be readmitted.

## 2024-04-14 NOTE — Consult Note (Signed)
 PHARMACY - BRIEF ANTIBIOTIC NOTE   Pharmacy has received consult(s) for vancomycin from an ED provider. The patient's profile has been reviewed for ht/wt/allergies/indication/available labs.    One time order(s) placed for: Vancomycin 2250mg   Further antibiotics/pharmacy consults should be ordered by admitting physician if indicated.                       Thank you,  Will M. Alva Jewels, PharmD Clinical Pharmacist 04/14/2024 5:29 PM

## 2024-04-14 NOTE — ED Triage Notes (Signed)
 First nurse note: Pt to ED via POV from home. Pt called to come back in due to + staph. Pt left AMA when admitted. Pt to come back in readmission.

## 2024-04-14 NOTE — Sepsis Progress Note (Signed)
 Code Sepsis protocol being monitored by eLink.

## 2024-04-14 NOTE — ED Notes (Signed)
 This RN called Lab to straight stick for Labs. Iv team consulted.

## 2024-04-14 NOTE — ED Notes (Signed)
IV TEAM AT BEDSIDE 

## 2024-04-15 DIAGNOSIS — R7881 Bacteremia: Secondary | ICD-10-CM | POA: Diagnosis not present

## 2024-04-15 DIAGNOSIS — B9562 Methicillin resistant Staphylococcus aureus infection as the cause of diseases classified elsewhere: Secondary | ICD-10-CM | POA: Diagnosis not present

## 2024-04-15 LAB — AEROBIC CULTURE W GRAM STAIN (SUPERFICIAL SPECIMEN): Gram Stain: NONE SEEN

## 2024-04-15 LAB — BASIC METABOLIC PANEL WITH GFR
Anion gap: 8 (ref 5–15)
BUN: 27 mg/dL — ABNORMAL HIGH (ref 6–20)
CO2: 25 mmol/L (ref 22–32)
Calcium: 8.2 mg/dL — ABNORMAL LOW (ref 8.9–10.3)
Chloride: 103 mmol/L (ref 98–111)
Creatinine, Ser: 1.28 mg/dL — ABNORMAL HIGH (ref 0.61–1.24)
GFR, Estimated: >60 mL/min (ref >60)
Glucose, Bld: 145 mg/dL — ABNORMAL HIGH (ref 70–99)
Potassium: 3.3 mmol/L — ABNORMAL LOW (ref 3.5–5.1)
Sodium: 136 mmol/L (ref 135–145)

## 2024-04-15 LAB — VANCOMYCIN, RANDOM: Vancomycin Rm: 9 ug/mL

## 2024-04-15 MED ORDER — MIDODRINE HCL 5 MG PO TABS
10.0000 mg | ORAL_TABLET | Freq: Three times a day (TID) | ORAL | Status: DC
  Administered 2024-04-15 (×2): 10 mg via ORAL
  Filled 2024-04-15 (×2): qty 2

## 2024-04-15 MED ORDER — POTASSIUM CHLORIDE CRYS ER 20 MEQ PO TBCR
40.0000 meq | EXTENDED_RELEASE_TABLET | Freq: Once | ORAL | Status: DC
  Filled 2024-04-15: qty 2

## 2024-04-15 MED ORDER — POTASSIUM CHLORIDE 20 MEQ PO PACK
40.0000 meq | PACK | Freq: Once | ORAL | Status: AC
  Administered 2024-04-15: 40 meq via ORAL
  Filled 2024-04-15: qty 2

## 2024-04-15 MED ORDER — SODIUM CHLORIDE 0.9 % IV SOLN: INTRAVENOUS | Status: AC

## 2024-04-15 MED ORDER — MIDODRINE HCL 5 MG PO TABS
5.0000 mg | ORAL_TABLET | Freq: Three times a day (TID) | ORAL | Status: DC
  Administered 2024-04-16 (×3): 5 mg via ORAL
  Filled 2024-04-15 (×3): qty 1

## 2024-04-15 MED ORDER — VANCOMYCIN HCL 1250 MG/250ML IV SOLN
1250.0000 mg | Freq: Two times a day (BID) | INTRAVENOUS | Status: DC
  Administered 2024-04-15 – 2024-04-17 (×4): 1250 mg via INTRAVENOUS
  Filled 2024-04-15 (×4): qty 250

## 2024-04-15 NOTE — Plan of Care (Signed)

## 2024-04-15 NOTE — Progress Notes (Signed)
  PROGRESS NOTE    Eisen Robenson  ZOX:[id number] DOB: 1981/09/07 DOA: 04/14/2024 PCP: Leopoldo Rancher, PA  203A/203A-AA  LOS: 1 day   Brief hospital course:   Assessment & Plan: [place] Luppino is a 43 y.o. male with a PMH significant for IVDU, opioid dependence, chronic infected skin ulceration, systolic murmur, prediabetes, MRSA bacteremia.   They presented from home to the ED on 04/14/2024 due to the recommendation of Dr. Harwood Lingo, ID to complete his inpatient treatment for MRSA bacteremia.  He presents with his mother and agrees to complete his treatment which was cut short from his recent hospitalization where he received the diagnosis and left AMA on 6/4.  He was being treated for acute opioid withdrawal at that time.  At this time, he denies any withdrawal symptoms and denies significant pain.    Sepsis, ruled out --does not meet SIRS criteria  MRSA bacteremia -blood cultures from prior admission positive for MRSA.  Patient left AMA 6/4 and returned with the guidance of ID and his mother. --cont IV vanc - Echo ordered-significant systolic murmur appreciated on exam - ID following - follow up repeat cultures   Leg ulcers-POA.   See clinical images for further detail. Reportedly due to trauma from injection. --wound care RN consulted --wound care per order   AKI -creatinine at baseline close to 1 and on presentation is 2.15.   --cont MIVF  Hypotension --asymptomatic --cont MIVF --start midodrine   DVT prophylaxis: Lovenox  SQ Code Status: Full code  Family Communication: mother updated at bedside today Level of care: Med-Surg Dispo:   The patient is from: home Anticipated d/c is to: home Anticipated d/c date is: to be determined   Subjective and Interval History:  Denied pain.  BP had been low, but asymptomatic.   Objective: Vitals:   04/15/24 0444 04/15/24 0857 04/15/24 1307 04/15/24 1640  BP: (!) 91/53 (!) 86/55 (!) 83/56 123/76  Pulse: (!) 58 (!) 56 65  (!) 46  Resp:  17 18   Temp:  97.8 F (36.6 C) 98 F (36.7 C)   TempSrc:  Oral Oral   SpO2:  100% 100%   Height:        Intake/Output Summary (Last 24 hours) at 04/15/2024 1832 Last data filed at 04/15/2024 1421 Gross per 24 hour  Intake 249.61 ml  Output --  Net 249.61 ml   There were no vitals filed for this visit.  Examination:   Constitutional: NAD, AAOx3 HEENT: conjunctivae and lids normal, EOMI CV: No cyanosis.   RESP: normal respiratory effort, on RA Neuro: II - XII grossly intact.   Psych: Normal mood and affect.     Data Reviewed: I have personally reviewed labs and imaging studies  Time spent: 50 minutes  Garrison Kanner, MD [facility] If 7PM-7AM, please contact night-coverage 04/15/2024, 6:32 PM

## 2024-04-15 NOTE — Consult Note (Signed)
 Pharmacy Antibiotic Note  [name] is a 43 y.o. male admitted on 04/14/2024 with bacteremia.  Pharmacy has been consulted for vancomycin  dosing.  Plan: Pt received vancomycin   2250 mg total x 1. Scr trending down. Vancomycin  level at 9. Will start vancomycin  1250 mg q12H. Predicted AUC 496. Goal AUC 400-550. Plan to obtain vancomycin  level after 4th or 5th dose or if Scr trends back up.   Height: 6\' 1"  (185.4 cm) IBW/kg (Calculated) : 79.9  Temp (24hrs), Avg:97.7 F (36.5 C), Min:97.4 F (36.3 C), Max:98 F (36.7 C)  Recent Labs  Lab 04/11/24 1217 04/12/24 0405 04/14/24 1753 04/14/24 1754 04/14/24 2005 04/15/24 0452 04/15/24 1147  WBC 30.2* 34.9*  --  11.9*  --   --   --   CREATININE 1.02 0.93  --  2.15*  --  1.28*  --   LATICACIDVEN  --   --  2.2*  --  1.7  --   --   VANCORANDOM  --   --   --   --   --   --  9    Estimated Creatinine Clearance: 85 mL/min (A) (by C-G formula based on SCr of 1.28 mg/dL (H)).    No Known Allergies   Microbiology results: 6/3/ BCx: 2 out of 4 bottles (same set) MRSA.    Thank you for allowing pharmacy to be a part of this patient's care.  Trinidad Funk, PharmD, BCPS 04/15/2024 12:26 PM

## 2024-04-15 NOTE — Consult Note (Addendum)
 WOC team is familiar with this patient from previous admission (see consult note 04/11/2024) for leg wounds that are reportedly from injecting drugs into area; present for over a year per family member    Reason for Consult: B lower leg wounds  Wound type: full thickness B lower legs r/t drug use as above  Pressure Injury POA: NA  Measurement: see nursing flowsheet  Wound bed: L medial calf 75% red 25% yellow brown, R calf 50% red 50% yellow; L calf with dark hemorrhagic tissue, R calf largely red moist  Drainage (amount, consistency, odor) clear drainage per MD note  Periwound: Dressing procedure/placement/frequency:  Cleanse B lower leg wounds with Vashe wound cleanser [employer] [phone]) do not rinse and allow to air dry. Apply Vashe moistened gauze daily to wound beds, cover with ABD pads and Kerlix roll gauze anchored around feet to help secure. Can apply Ace bandage wrapped in same fashion as Kerlix for light compression.  SOAK DRESSINGS WITH NS IF STUCK TO WOUND BEDS FOR ATRAUMATIC REMOVAL.   Patient would benefit from referral to wound care center for ongoing management of these wounds however wounds unlikely to heal if patient continues to use sites for injection.    POC discussed with bedside nurse. WOC team will not follow. Re-consult if further needs arise.    Thank you,    Ronni Colace MSN, RN-BC, [employer] [phone]

## 2024-04-15 NOTE — Plan of Care (Signed)
 Neuro: Pt A&Ox 4, follows commands, alert  Pt continues to receive IV antibiotics. Pt ambulatory with self. No PRN medication given. Pt's family at bed side, supportive and cooperative.     Problem: Education: Goal: Knowledge of General Education information will improve Description: Including pain rating scale, medication(s)/side effects and non-pharmacologic comfort measures Outcome: Progressing   Problem: Health Behavior/Discharge Planning: Goal: Ability to manage health-related needs will improve Outcome: Progressing   Problem: Clinical Measurements: Goal: Ability to maintain clinical measurements within normal limits will improve Outcome: Progressing Goal: Will remain free from infection Outcome: Progressing Goal: Diagnostic test results will improve Outcome: Progressing Goal: Respiratory complications will improve Outcome: Progressing Goal: Cardiovascular complication will be avoided Outcome: Progressing   Problem: Activity: Goal: Risk for activity intolerance will decrease Outcome: Progressing   Problem: Nutrition: Goal: Adequate nutrition will be maintained Outcome: Progressing   Problem: Coping: Goal: Level of anxiety will decrease Outcome: Progressing   Problem: Elimination: Goal: Will not experience complications related to bowel motility Outcome: Progressing Goal: Will not experience complications related to urinary retention Outcome: Progressing   Problem: Pain Managment: Goal: General experience of comfort will improve and/or be controlled Outcome: Progressing   Problem: Safety: Goal: Ability to remain free from injury will improve Outcome: Progressing   Problem: Skin Integrity: Goal: Risk for impaired skin integrity will decrease Outcome: Progressing

## 2024-04-16 ENCOUNTER — Inpatient Hospital Stay
Admit: 2024-04-16 | Discharge: 2024-04-16 | Disposition: A | Discharge status: Home / Self Care | Attending: Student in an Organized Health Care Education/Training Program | Admitting: Student in an Organized Health Care Education/Training Program

## 2024-04-16 DIAGNOSIS — B9562 Methicillin resistant Staphylococcus aureus infection as the cause of diseases classified elsewhere: Secondary | ICD-10-CM | POA: Diagnosis not present

## 2024-04-16 DIAGNOSIS — R7881 Bacteremia: Secondary | ICD-10-CM | POA: Diagnosis not present

## 2024-04-16 LAB — ECHOCARDIOGRAM COMPLETE
AR max vel: 2.33 cm2
AV Area VTI: 2.52 cm2
AV Area mean vel: 2.31 cm2
AV Mean grad: 8.5 mmHg
AV Peak grad: 17.1 mmHg
Ao pk vel: 2.07 m/s
Area-P 1/2: 4.17 cm2
Height: 73 in
MV VTI: 2.82 cm2
S' Lateral: 3.4 cm

## 2024-04-16 LAB — CULTURE, BLOOD (ROUTINE X 2)
Culture: NO GROWTH
Special Requests: ADEQUATE

## 2024-04-16 MED ORDER — MIDODRINE HCL 5 MG PO TABS
2.5000 mg | ORAL_TABLET | Freq: Three times a day (TID) | ORAL | Status: DC
  Administered 2024-04-17: 2.5 mg via ORAL
  Filled 2024-04-16 (×2): qty 1

## 2024-04-16 NOTE — Progress Notes (Signed)
*  PRELIMINARY RESULTS* Echocardiogram 2D Echocardiogram has been performed.  [name] 04/16/2024, 1:35 PM

## 2024-04-16 NOTE — TOC Initial Note (Signed)
 Transition of Care [facility]) - Initial/Assessment Note    Patient Details  Name: [name] MRN: [id number] Date of Birth: 1981-02-14  [facility]) CM/SW Contact:    Alexandra Ice, RN Phone Number: 04/16/2024, 9:10 AM  Clinical Narrative:                  Patient was recently hospitalized for similar complaints, he left hospital AMA - against medical advice. Brief assessment completed, no TOC needs identified at this time.        Activities of Daily Living   ADL Screening (condition at time of admission) Independently performs ADLs?: Yes (appropriate for developmental age) Is the patient deaf or have difficulty hearing?: No Does the patient have difficulty seeing, even when wearing glasses/contacts?: No Does the patient have difficulty concentrating, remembering, or making decisions?: No            Admission diagnosis:  MRSA bacteremia [R78.81, B95.62] Septic shock (HCC) [A41.9, R65.21] Patient Active Problem List   Diagnosis Date Noted   Sepsis (HCC) 04/12/2024   MRSA bacteremia 04/12/2024   Infected ulcer of skin (HCC) 04/11/2024   Systolic murmur 04/11/2024   Excoriation (skin-picking) disorder 04/19/2023   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 [place] [address] Phone: [phone] Fax: [phone]  [id number] - [place], [place] - [address] [place] [address] Phone: [phone] Fax: [phone]  [id number] [place], [place] - [address] AT [facility] [address] [place] [address] [place]  [address] Phone: [phone] Fax: [phone]     Social Drivers of Health (SDOH) Social History: SDOH Screenings   Food Insecurity: No Food Insecurity (04/14/2024)  Housing: Low Risk  (04/14/2024)  Transportation Needs: No Transportation Needs (04/14/2024)  Utilities: Not At Risk (04/14/2024)  Depression (PHQ2-9): Low Risk  (10/27/2023)  Financial Resource Strain: Low Risk  (04/19/2023)  Recent Concern: Financial Resource Strain - High Risk (04/19/2023)  Tobacco Use: Medium Risk (02/28/2024)   Received from [facility]   SDOH Interventions:     Readmission Risk Interventions     No data to display

## 2024-04-16 NOTE — Progress Notes (Signed)
 Patient refused lab work x 2 attempts this shift. MD made aware.

## 2024-04-16 NOTE — Progress Notes (Signed)
  PROGRESS NOTE    [name]  ZHY:[id number] DOB: [birth year] DOA: 04/14/2024 PCP: Leopoldo Rancher, PA  203A/203A-AA  LOS: 2 days   Brief hospital course:   Assessment & Plan: [name] is a 43 y.o. male with a PMH significant for IVDU, opioid dependence, chronic infected skin ulceration, systolic murmur, prediabetes, MRSA bacteremia.   They presented from home to the ED on 04/14/2024 due to the recommendation of Dr. Harwood Lingo, ID to complete his inpatient treatment for MRSA bacteremia.  He presents with his mother and agrees to complete his treatment which was cut short from his recent hospitalization where he received the diagnosis and left AMA on 6/4.  He was being treated for acute opioid withdrawal at that time.  At this time, he denies any withdrawal symptoms and denies significant pain.    Sepsis, ruled out --does not meet SIRS criteria  MRSA bacteremia -blood cultures from prior admission positive for MRSA.  Patient left AMA 6/4 and returned with the guidance of ID and his mother. --Echo neg for thrombus --cont IV vanc - ID following - follow up repeat cultures   Leg ulcers-POA.   See clinical images for further detail. Reportedly due to trauma from injection. --wound care RN consulted --wound care per order   AKI -creatinine at baseline close to 1 and on presentation is 2.15.   --s/p MIVF --oral hydration  Hypotension --asymptomatic.  BP improved today --taper midodrine to 2.5 TID   DVT prophylaxis: Lovenox  SQ Code Status: Full code  Family Communication:  Level of care: Med-Surg Dispo:   The patient is from: home Anticipated d/c is to: home Anticipated d/c date is: to be determined   Subjective and Interval History:  No new event.   Objective: Vitals:   04/16/24 0341 04/16/24 0751 04/16/24 1549 04/16/24 1932  BP: (!) 97/52 112/69 103/64 (!) 133/58  Pulse: (!) 55 (!) 55 64 67  Resp: 16 (!) 22 16 16   Temp: 97.9 F (36.6 C) 97.6 F (36.4 C)  98 F (36.7 C) 97.7 F (36.5 C)  TempSrc: Oral   Oral  SpO2: 100% 100% 100% 100%  Height:        Intake/Output Summary (Last 24 hours) at 04/16/2024 1958 Last data filed at 04/16/2024 1538 Gross per 24 hour  Intake 500 ml  Output --  Net 500 ml   There were no vitals filed for this visit.  Examination:   Constitutional: NAD, sleepy CV: No cyanosis.   RESP: normal respiratory effort, on RA Neuro: II - XII grossly intact.     Data Reviewed: I have personally reviewed labs and imaging studies  Time spent: 35 minutes  Garrison Kanner, MD [facility] If 7PM-7AM, please contact night-coverage 04/16/2024, 7:58 PM

## 2024-04-16 NOTE — Plan of Care (Signed)

## 2024-04-16 NOTE — Care Management Important Message (Signed)
 Important Message  Patient Details  Name: [name] MRN: [id number] Date of Birth: 31-May-1981   Important Message Given:  Yes - Medicare IM     Anise Kerns 04/16/2024, 3:12 PM

## 2024-04-17 ENCOUNTER — Telehealth (HOSPITAL_COMMUNITY): Payer: Self-pay | Admitting: Pharmacy Technician

## 2024-04-17 ENCOUNTER — Other Ambulatory Visit (HOSPITAL_COMMUNITY): Payer: Self-pay

## 2024-04-17 ENCOUNTER — Other Ambulatory Visit: Payer: Self-pay | Admitting: Infectious Diseases

## 2024-04-17 DIAGNOSIS — R7881 Bacteremia: Secondary | ICD-10-CM | POA: Diagnosis not present

## 2024-04-17 DIAGNOSIS — B9562 Methicillin resistant Staphylococcus aureus infection as the cause of diseases classified elsewhere: Secondary | ICD-10-CM | POA: Diagnosis not present

## 2024-04-17 LAB — BASIC METABOLIC PANEL WITH GFR
Anion gap: 9 (ref 5–15)
BUN: 6 mg/dL (ref 6–20)
CO2: 25 mmol/L (ref 22–32)
Calcium: 8.9 mg/dL (ref 8.9–10.3)
Chloride: 106 mmol/L (ref 98–111)
Creatinine, Ser: 0.83 mg/dL (ref 0.61–1.24)
GFR, Estimated: >60 mL/min (ref >60)
Glucose, Bld: 129 mg/dL — ABNORMAL HIGH (ref 70–99)
Potassium: 3.8 mmol/L (ref 3.5–5.1)
Sodium: 140 mmol/L (ref 135–145)

## 2024-04-17 LAB — CBC
HCT: 36.4 % — ABNORMAL LOW (ref 39.0–52.0)
Hemoglobin: 11.4 g/dL — ABNORMAL LOW (ref 13.0–17.0)
MCH: 28.4 pg (ref 26.0–34.0)
MCHC: 31.3 g/dL (ref 30.0–36.0)
MCV: 90.5 fL (ref 80.0–100.0)
Platelets: 376 10*3/uL (ref 150–400)
RBC: 4.02 MIL/uL — ABNORMAL LOW (ref 4.22–5.81)
RDW: 14.1 % (ref 11.5–15.5)
WBC: 8 10*3/uL (ref 4.0–10.5)
nRBC: 0 % (ref 0.0–0.2)

## 2024-04-17 LAB — PHOSPHORUS: Phosphorus: 2.1 mg/dL — ABNORMAL LOW (ref 2.5–4.6)

## 2024-04-17 LAB — MAGNESIUM: Magnesium: 1.7 mg/dL (ref 1.7–2.4)

## 2024-04-17 MED ORDER — VANCOMYCIN HCL 2000 MG/400ML IV SOLN: 2000.0000 mg | Freq: Two times a day (BID) | INTRAVENOUS | Status: DC

## 2024-04-17 NOTE — Progress Notes (Signed)
 This RN rounded on pt and noticed pt was not in his room. Security called and notified of Elopement. This RN called his mother to see if she knew pt had left. Pt was seen by another staff member getting into a black vehicle. Pt shortly returned for IVs to be removed and signed AMA paperwork. MD aware and notified.

## 2024-04-17 NOTE — Progress Notes (Signed)
 Was notified by unit staff, pt could not be found. Operator made overhead announcement for missing person. Called [phone], number listed for pt, and call went to voice mail. Was told voice mail was full. Sent text message requesting pt call us  to llet us  know he was ok, and if he didn't want to be a pt, he needed to return to get his IV removed.

## 2024-04-17 NOTE — Discharge Summary (Signed)
 Physician AMA Discharge Summary   [name]  male DOB: September 02, 1981  ZOX:[id number]  PCP: Leopoldo Rancher, PA  Admit date: 04/14/2024 AMA Discharge date: 04/17/2024  CODE STATUS: Full code   Hospital Course:  For full details, please see H&P, progress notes, consult notes and ancillary notes.  Briefly,  [name] is a 43 y.o. male with a PMH significant for IVDU, opioid dependence, chronic infected skin ulcerations, prediabetes, who presented from home to the ED on 04/14/2024 due to the recommendation of Dr. Harwood Lingo, ID to complete his inpatient treatment for MRSA bacteremia.    Pt had previously presented on 04/11/24 with opioid withdrawal and worsening of leg ulcers.  He left AMA on 6/4.  Blood cx during that hospitalization returned pos for MRSA, so pt was called and urged to return back to hospital for treatment.  He presented on 04/14/24 with his mother and agreed to complete his treatment.  IV Vanc started.  Pt, however, left AMA again on 6/9 without warning.  Sepsis, ruled out --did not meet SIRS criteria   MRSA bacteremia -blood cultures from prior admission positive for MRSA.  Patient left AMA 6/4 and returned with the guidance of ID and his mother. --Echo neg for thrombus.  I was in the process of convincing pt to undergo TEE when pt left AMA again.   Chronic Leg ulcers-POA.   See clinical images for further detail. Reportedly due to trauma from injection. --wound care RN consulted --wound care per order   AKI -creatinine at baseline close to 1 and on presentation is 2.15.   --s/p MIVF   Hypotension --asymptomatic.  BP improved with midodrine   Discharge Diagnoses:  Principal Problem:   MRSA bacteremia   30 Day Unplanned Readmission Risk Score    Flowsheet Row ED to Hosp-Admission (Discharged) from 04/14/2024 in [facility]  30 Day Unplanned Readmission Risk Score (%) 21.83 Filed at 04/17/2024 1200       This score  is the patient's risk of an unplanned readmission within 30 days of being discharged (0 -100%). The score is based on dignosis, age, lab data, medications, orders, and past utilization.   Low:  0-14.9   Medium: 15-21.9   High: 22-29.9   Extreme: 30 and above         Discharge Instructions:  Med rec not performed due to pt having left AMA without warning.    No Known Allergies   The results of significant diagnostics from this hospitalization (including imaging, microbiology, ancillary and laboratory) are listed below for reference.   Consultations:   Procedures/Studies: ECHOCARDIOGRAM COMPLETE Result Date: 04/16/2024    ECHOCARDIOGRAM REPORT   Patient Name:   EVEN [name] Date of Exam: 04/16/2024 Medical Rec #:  [id number]    Height:       73.0 in Accession #:    [id number]   Weight:       206.0 lb Date of Birth:  July 10, 1981   BSA:          2.179 m Patient Age:    42 years     BP:           112/69 mmHg Patient Gender: M            HR:           60 bpm. Exam Location:  [facility] Procedure: 2D Echo, Cardiac Doppler and Color Doppler (Both Spectral and Color  Flow Doppler were utilized during procedure). Indications:     Murmur R01.1  History:         Patient has no prior history of Echocardiogram examinations.                  Signs/Symptoms:Murmur.  Sonographer:     Kathaleen Pale Roar Referring Phys:  [id number] Ree [name] Diagnosing Phys: Lida Reeks Alluri IMPRESSIONS  1. Left ventricular ejection fraction, by estimation, is 65 to 70%. The left ventricle has normal function. The left ventricle has no regional wall motion abnormalities. There is mild left ventricular hypertrophy. Left ventricular diastolic parameters were normal.  2. Right ventricular systolic function is normal. The right ventricular size is normal.  3. The mitral valve is normal in structure. Mild mitral valve regurgitation.  4. The aortic valve is tricuspid. Aortic valve regurgitation is not visualized.  5. The inferior vena  cava is normal in size with greater than 50% respiratory variability, suggesting right atrial pressure of 3 mmHg. FINDINGS  Left Ventricle: Left ventricular ejection fraction, by estimation, is 65 to 70%. The left ventricle has normal function. The left ventricle has no regional wall motion abnormalities. The left ventricular internal cavity size was normal in size. There is  mild left ventricular hypertrophy. Left ventricular diastolic parameters were normal. Right Ventricle: The right ventricular size is normal. No increase in right ventricular wall thickness. Right ventricular systolic function is normal. Left Atrium: Left atrial size was normal in size. Right Atrium: Right atrial size was normal in size. Pericardium: There is no evidence of pericardial effusion. Mitral Valve: The mitral valve is normal in structure. Mild mitral valve regurgitation. MV peak gradient, 4.6 mmHg. The mean mitral valve gradient is 2.0 mmHg. Tricuspid Valve: The tricuspid valve is normal in structure. Tricuspid valve regurgitation is mild. Aortic Valve: The aortic valve is tricuspid. Aortic valve regurgitation is not visualized. Aortic valve mean gradient measures 8.5 mmHg. Aortic valve peak gradient measures 17.1 mmHg. Aortic valve area, by VTI measures 2.52 cm. Pulmonic Valve: The pulmonic valve was not well visualized. Pulmonic valve regurgitation is trivial. Aorta: The aortic root and ascending aorta are structurally normal, with no evidence of dilitation. Venous: The inferior vena cava is normal in size with greater than 50% respiratory variability, suggesting right atrial pressure of 3 mmHg. IAS/Shunts: The atrial septum is grossly normal.  LEFT VENTRICLE PLAX 2D LVIDd:         5.00 cm   Diastology LVIDs:         3.40 cm   LV e' medial:    10.90 cm/s LV PW:         1.20 cm   LV E/e' medial:  8.6 LV IVS:        1.30 cm   LV e' lateral:   14.40 cm/s LVOT diam:     1.90 cm   LV E/e' lateral: 6.5 LV SV:         100 LV SV Index:    46 LVOT Area:     2.84 cm  RIGHT VENTRICLE RV Basal diam:  3.90 cm RV Mid diam:    3.10 cm RV S prime:     15.70 cm/s TAPSE (M-mode): 3.3 cm LEFT ATRIUM             Index        RIGHT ATRIUM           Index LA diam:        4.00 cm 1.84  cm/m   RA Area:     20.10 cm LA Vol (A2C):   78.7 ml 36.12 ml/m  RA Volume:   56.30 ml  25.84 ml/m LA Vol (A4C):   57.6 ml 26.44 ml/m LA Biplane Vol: 68.1 ml 31.26 ml/m  AORTIC VALVE                     PULMONIC VALVE AV Area (Vmax):    2.33 cm      PV Vmax:          1.15 m/s AV Area (Vmean):   2.31 cm      PV Peak grad:     5.3 mmHg AV Area (VTI):     2.52 cm      PR End Diast Vel: 5.66 msec AV Vmax:           206.50 cm/s   RVOT Peak grad:   3 mmHg AV Vmean:          133.500 cm/s AV VTI:            0.396 m AV Peak Grad:      17.1 mmHg AV Mean Grad:      8.5 mmHg LVOT Vmax:         170.00 cm/s LVOT Vmean:        109.000 cm/s LVOT VTI:          0.352 m LVOT/AV VTI ratio: 0.89  AORTA Ao Root diam: 2.80 cm Ao Asc diam:  3.00 cm MITRAL VALVE               TRICUSPID VALVE MV Area (PHT): 4.17 cm    TR Peak grad:   29.4 mmHg MV Area VTI:   2.82 cm    TR Vmax:        271.00 cm/s MV Peak grad:  4.6 mmHg MV Mean grad:  2.0 mmHg    SHUNTS MV Vmax:       1.07 m/s    Systemic VTI:  0.35 m MV Vmean:      63.6 cm/s   Systemic Diam: 1.90 cm MV Decel Time: 182 msec MV E velocity: 93.80 cm/s MV A velocity: 72.30 cm/s MV E/A ratio:  1.30 MV A Prime:    11.9 cm/s Joetta Mustache Electronically signed by Joetta Mustache Signature Date/Time: 04/16/2024/4:37:35 PM    Final    DG Chest Port 1 View Result Date: 04/14/2024 CLINICAL DATA:  Sepsis EXAM: PORTABLE CHEST 1 VIEW COMPARISON:  Chest radiograph dated 02/06/2019 FINDINGS: Mildly low lung volumes. Right basilar patchy opacities. No pleural effusion or pneumothorax. Enlarged cardiomediastinal silhouette. No acute osseous abnormality. Left upper quadrant surgical clips. IMPRESSION: 1. Right basilar patchy opacities, which may represent  atelectasis or pneumonia. 2. Cardiomegaly. Electronically Signed   By: Limin  Xu M.D.   On: 04/14/2024 17:58      Labs: BNP (last 3 results) No results for input(s): "BNP" in the last 8760 hours. Basic Metabolic Panel: Recent Labs  Lab 04/11/24 1217 04/12/24 0405 04/14/24 1754 04/15/24 0452 04/17/24 1146  NA 138 137 136 136 140  K 3.3* 3.5 3.7 3.3* 3.8  CL 98 99 98 103 106  CO2 27 25 27 25 25   GLUCOSE 139* 125* 129* 145* 129*  BUN 8 10 40* 27* 6  CREATININE 1.02 0.93 2.15* 1.28* 0.83  CALCIUM  9.5 9.5 8.6* 8.2* 8.9  MG 1.8  --   --   --  1.7  PHOS  --   --   --   --  2.1*   Liver Function Tests: Recent Labs  Lab 04/11/24 1217 04/14/24 1754  AST 25 17  ALT 13 14  ALKPHOS 76 58  BILITOT 1.0 0.4  PROT 8.6* 7.4  ALBUMIN 4.0 3.2*   No results for input(s): "LIPASE", "AMYLASE" in the last 168 hours. No results for input(s): "AMMONIA" in the last 168 hours. CBC: Recent Labs  Lab 04/11/24 1217 04/12/24 0405 04/14/24 1754 04/17/24 1146  WBC 30.2* 34.9* 11.9* 8.0  NEUTROABS 28.8*  --  7.5  --   HGB 12.5* 12.9* 11.1* 11.4*  HCT 39.1 40.4 35.7* 36.4*  MCV 88.5 88.0 89.9 90.5  PLT 456* 489* 395 376   Cardiac Enzymes: No results for input(s): "CKTOTAL", "CKMB", "CKMBINDEX", "TROPONINI" in the last 168 hours. BNP: Invalid input(s): "POCBNP" CBG: No results for input(s): "GLUCAP" in the last 168 hours. D-Dimer No results for input(s): "DDIMER" in the last 72 hours. Hgb A1c No results for input(s): "HGBA1C" in the last 72 hours. Lipid Profile No results for input(s): "CHOL", "HDL", "LDLCALC", "TRIG", "CHOLHDL", "LDLDIRECT" in the last 72 hours. Thyroid  function studies No results for input(s): "TSH", "T4TOTAL", "T3FREE", "THYROIDAB" in the last 72 hours.  Invalid input(s): "FREET3" Anemia work up No results for input(s): "VITAMINB12", "FOLATE", "FERRITIN", "TIBC", "IRON", "RETICCTPCT" in the last 72 hours. Urinalysis    Component Value Date/Time   COLORURINE  YELLOW (A) 04/14/2024 2005   APPEARANCEUR CLEAR (A) 04/14/2024 2005   LABSPEC 1.012 04/14/2024 2005   PHURINE 5.0 04/14/2024 2005   GLUCOSEU NEGATIVE 04/14/2024 2005   HGBUR NEGATIVE 04/14/2024 2005   BILIRUBINUR NEGATIVE 04/14/2024 2005   BILIRUBINUR Negative 05/19/2023 1457   KETONESUR NEGATIVE 04/14/2024 2005   PROTEINUR NEGATIVE 04/14/2024 2005   UROBILINOGEN 0.2 05/19/2023 1457   NITRITE NEGATIVE 04/14/2024 2005   LEUKOCYTESUR NEGATIVE 04/14/2024 2005   Sepsis Labs Recent Labs  Lab 04/11/24 1217 04/12/24 0405 04/14/24 1754 04/17/24 1146  WBC 30.2* 34.9* 11.9* 8.0   Microbiology Recent Results (from the past 240 hours)  Culture, blood (routine x 2)     Status: Abnormal (Preliminary result)   Collection Time: 04/11/24 12:17 PM   Specimen: BLOOD  Result Value Ref Range Status   Specimen Description   Final    BLOOD BLOOD LEFT ARM Performed at [facility], [address], [place], [place] [address]    Special Requests   Final    BOTTLES DRAWN AEROBIC AND ANAEROBIC Blood Culture results may not be optimal due to an inadequate volume of blood received in culture bottles Performed at [facility], [address], [place], [place] [address]    Culture  Setup Time   Final    GRAM POSITIVE COCCI IN BOTH AEROBIC AND ANAEROBIC BOTTLES CRITICAL RESULT CALLED TO, READ BACK BY AND VERIFIED WITH: JASON ROBBINS 04/12/24 0543 MW Performed at [facility], [address] [place], [place] [address]    Culture METHICILLIN RESISTANT STAPHYLOCOCCUS AUREUS (A)  Final   Report Status PENDING  Incomplete   Organism ID, Bacteria METHICILLIN RESISTANT STAPHYLOCOCCUS AUREUS  Final      Susceptibility   Methicillin resistant staphylococcus aureus - MIC*    CIPROFLOXACIN >=8 RESISTANT Resistant     ERYTHROMYCIN >=8 RESISTANT Resistant     GENTAMICIN <=0.5 SENSITIVE Sensitive     OXACILLIN >=4 RESISTANT Resistant     TETRACYCLINE >=16 RESISTANT Resistant      VANCOMYCIN  1 SENSITIVE Sensitive     TRIMETH/SULFA >=320 RESISTANT Resistant  CLINDAMYCIN  <=0.25 SENSITIVE Sensitive     RIFAMPIN <=0.5 SENSITIVE Sensitive     Inducible Clindamycin  NEGATIVE Sensitive     LINEZOLID 2 SENSITIVE Sensitive     * METHICILLIN RESISTANT STAPHYLOCOCCUS AUREUS  Blood Culture ID Panel (Reflexed)     Status: Abnormal   Collection Time: 04/11/24 12:17 PM  Result Value Ref Range Status   Enterococcus faecalis NOT DETECTED NOT DETECTED Final   Enterococcus Faecium NOT DETECTED NOT DETECTED Final   Listeria monocytogenes NOT DETECTED NOT DETECTED Final   Staphylococcus species DETECTED (A) NOT DETECTED Final    Comment: CRITICAL RESULT CALLED TO, READ BACK BY AND VERIFIED WITH: JASON ROBBINS 04/12/24 0544 MW    Staphylococcus aureus (BCID) DETECTED (A) NOT DETECTED Final    Comment: Methicillin (oxacillin)-resistant Staphylococcus aureus (MRSA). MRSA is predictably resistant to beta-lactam antibiotics (except ceftaroline). Preferred therapy is vancomycin  unless clinically contraindicated. Patient requires contact precautions if  hospitalized. CRITICAL RESULT CALLED TO, READ BACK BY AND VERIFIED WITH: JASON ROBBINS 04/12/24 0544 MW    Staphylococcus epidermidis NOT DETECTED NOT DETECTED Final   Staphylococcus lugdunensis NOT DETECTED NOT DETECTED Final   Streptococcus species NOT DETECTED NOT DETECTED Final   Streptococcus agalactiae NOT DETECTED NOT DETECTED Final   Streptococcus pneumoniae NOT DETECTED NOT DETECTED Final   Streptococcus pyogenes NOT DETECTED NOT DETECTED Final   A.calcoaceticus-baumannii NOT DETECTED NOT DETECTED Final   Bacteroides fragilis NOT DETECTED NOT DETECTED Final   Enterobacterales NOT DETECTED NOT DETECTED Final   Enterobacter cloacae complex NOT DETECTED NOT DETECTED Final   Escherichia coli NOT DETECTED NOT DETECTED Final   Klebsiella aerogenes NOT DETECTED NOT DETECTED Final   Klebsiella oxytoca NOT DETECTED NOT DETECTED Final    Klebsiella pneumoniae NOT DETECTED NOT DETECTED Final   Proteus species NOT DETECTED NOT DETECTED Final   Salmonella species NOT DETECTED NOT DETECTED Final   Serratia marcescens NOT DETECTED NOT DETECTED Final   Haemophilus influenzae NOT DETECTED NOT DETECTED Final   Neisseria meningitidis NOT DETECTED NOT DETECTED Final   Pseudomonas aeruginosa NOT DETECTED NOT DETECTED Final   Stenotrophomonas maltophilia NOT DETECTED NOT DETECTED Final   Candida albicans NOT DETECTED NOT DETECTED Final   Candida auris NOT DETECTED NOT DETECTED Final   Candida glabrata NOT DETECTED NOT DETECTED Final   Candida krusei NOT DETECTED NOT DETECTED Final   Candida parapsilosis NOT DETECTED NOT DETECTED Final   Candida tropicalis NOT DETECTED NOT DETECTED Final   Cryptococcus neoformans/gattii NOT DETECTED NOT DETECTED Final   Meth resistant mecA/C and MREJ DETECTED (A) NOT DETECTED Final    Comment: CRITICAL RESULT CALLED TO, READ BACK BY AND VERIFIED WITH: JASON ROBBINS 04/12/24 0544 MW Performed at [facility], [address] [place], [place] [address]   Culture, blood (Routine X 2) w Reflex to ID Panel     Status: None   Collection Time: 04/11/24  7:05 PM   Specimen: BLOOD  Result Value Ref Range Status   Specimen Description BLOOD LEFT ANTECUBITAL  Final   Special Requests   Final    BOTTLES DRAWN AEROBIC AND ANAEROBIC Blood Culture adequate volume   Culture   Final    NO GROWTH 5 DAYS Performed at [facility], [address] [place], [place] [address]    Report Status 04/16/2024 FINAL  Final  Aerobic Culture w Gram Stain (superficial specimen)     Status: None   Collection Time: 04/12/24  2:08 PM   Specimen: Wound  Result Value Ref Range  Status   Specimen Description   Final    WOUND Performed at [facility], [address], [place], [place] [address]    Special Requests   Final    LEG Performed at [facility], [address] [place],  [place] [address]    Gram Stain   Final    NO WBC SEEN RARE Hillis Lu NEGATIVE RODS FEW GRAM POSITIVE COCCI Performed at [facility], [address] [place], [place] [address]    Culture   Final    ABUNDANT METHICILLIN RESISTANT STAPHYLOCOCCUS AUREUS ABUNDANT ENTEROBACTER CLOACAE    Report Status 04/15/2024 FINAL  Final   Organism ID, Bacteria METHICILLIN RESISTANT STAPHYLOCOCCUS AUREUS  Final   Organism ID, Bacteria ENTEROBACTER CLOACAE  Final      Susceptibility   Enterobacter cloacae - MIC*    CEFEPIME  >=32 RESISTANT Resistant     CEFTAZIDIME <=1 SENSITIVE Sensitive     CIPROFLOXACIN <=0.25 SENSITIVE Sensitive     GENTAMICIN <=1 SENSITIVE Sensitive     IMIPENEM 0.5 SENSITIVE Sensitive     TRIMETH/SULFA <=20 SENSITIVE Sensitive     PIP/TAZO 16 SENSITIVE Sensitive ug/mL    * ABUNDANT ENTEROBACTER CLOACAE   Methicillin resistant staphylococcus aureus - MIC*    CIPROFLOXACIN >=8 RESISTANT Resistant     ERYTHROMYCIN >=8 RESISTANT Resistant     GENTAMICIN <=0.5 SENSITIVE Sensitive     OXACILLIN >=4 RESISTANT Resistant     TETRACYCLINE >=16 RESISTANT Resistant     VANCOMYCIN  1 SENSITIVE Sensitive     TRIMETH/SULFA >=320 RESISTANT Resistant     CLINDAMYCIN  <=0.25 SENSITIVE Sensitive     RIFAMPIN <=0.5 SENSITIVE Sensitive     Inducible Clindamycin  NEGATIVE Sensitive     LINEZOLID 2 SENSITIVE Sensitive     * ABUNDANT METHICILLIN RESISTANT STAPHYLOCOCCUS AUREUS  Resp panel by RT-PCR (RSV, Flu A&B, Covid) Anterior Nasal Swab     Status: None   Collection Time: 04/14/24  5:25 PM   Specimen: Anterior Nasal Swab  Result Value Ref Range Status   SARS Coronavirus 2 by RT PCR NEGATIVE NEGATIVE Final    Comment: (NOTE) SARS-CoV-2 target nucleic acids are NOT DETECTED.  The SARS-CoV-2 RNA is generally detectable in upper respiratory specimens during the acute phase of infection. The lowest concentration of SARS-CoV-2 viral copies this assay can detect is 138 copies/mL. A negative result  does not preclude SARS-Cov-2 infection and should not be used as the sole basis for treatment or other patient management decisions. A negative result may occur with  improper specimen collection/handling, submission of specimen other than nasopharyngeal swab, presence of viral mutation(s) within the areas targeted by this assay, and inadequate number of viral copies(<138 copies/mL). A negative result must be combined with clinical observations, patient history, and epidemiological information. The expected result is Negative.  Fact Sheet for Patients:  BloggerCourse.com  Fact Sheet for Healthcare Providers:  SeriousBroker.it  This test is no t yet approved or cleared by the [place]  FDA and  has been authorized for detection and/or diagnosis of SARS-CoV-2 by FDA under an Emergency Use Authorization (EUA). This EUA will remain  in effect (meaning this test can be used) for the duration of the COVID-19 declaration under Section 564(b)(1) of the Act, 21 U.S.C.section 360bbb-3(b)(1), unless the authorization is terminated  or revoked sooner.       Influenza A by PCR NEGATIVE NEGATIVE Final   Influenza B by PCR NEGATIVE NEGATIVE Final    Comment: (NOTE) The Xpert Xpress SARS-CoV-2/FLU/RSV  plus assay is intended as an aid in the diagnosis of influenza from Nasopharyngeal swab specimens and should not be used as a sole basis for treatment. Nasal washings and aspirates are unacceptable for Xpert Xpress SARS-CoV-2/FLU/RSV testing.  Fact Sheet for Patients: BloggerCourse.com  Fact Sheet for Healthcare Providers: SeriousBroker.it  This test is not yet approved or cleared by the [place]  FDA and has been authorized for detection and/or diagnosis of SARS-CoV-2 by FDA under an Emergency Use Authorization (EUA). This EUA will remain in effect (meaning this test can be used) for  the duration of the COVID-19 declaration under Section 564(b)(1) of the Act, 21 U.S.C. section 360bbb-3(b)(1), unless the authorization is terminated or revoked.     Resp Syncytial Virus by PCR NEGATIVE NEGATIVE Final    Comment: (NOTE) Fact Sheet for Patients: BloggerCourse.com  Fact Sheet for Healthcare Providers: SeriousBroker.it  This test is not yet approved or cleared by the [place]  FDA and has been authorized for detection and/or diagnosis of SARS-CoV-2 by FDA under an Emergency Use Authorization (EUA). This EUA will remain in effect (meaning this test can be used) for the duration of the COVID-19 declaration under Section 564(b)(1) of the Act, 21 U.S.C. section 360bbb-3(b)(1), unless the authorization is terminated or revoked.  Performed at [facility], [address] [place], [place] [address]   Blood Culture (routine x 2)     Status: None (Preliminary result)   Collection Time: 04/14/24  5:54 PM   Specimen: BLOOD RIGHT HAND  Result Value Ref Range Status   Specimen Description BLOOD RIGHT HAND  Final   Special Requests   Final    BOTTLES DRAWN AEROBIC AND ANAEROBIC Blood Culture results may not be optimal due to an inadequate volume of blood received in culture bottles   Culture   Final    NO GROWTH 3 DAYS Performed at [facility], [address], [place], [place] [address]    Report Status PENDING  Incomplete  Blood Culture (routine x 2)     Status: None (Preliminary result)   Collection Time: 04/14/24  5:54 PM   Specimen: BLOOD LEFT HAND  Result Value Ref Range Status   Specimen Description BLOOD LEFT HAND  Final   Special Requests   Final    BOTTLES DRAWN AEROBIC AND ANAEROBIC Blood Culture results may not be optimal due to an inadequate volume of blood received in culture bottles   Culture   Final    NO GROWTH 3 DAYS Performed at [facility], [address]  [place], [place] [address]    Report Status PENDING  Incomplete     Level 2 note.    Garrison Kanner, MD  [facility] 04/17/2024, 5:15 PM

## 2024-04-17 NOTE — Telephone Encounter (Signed)
 Patient Product/process development scientist completed.    The patient is insured through [employer]. Patient has Medicare and is not eligible for a copay card, but may be able to apply for patient assistance or Medicare RX Payment Plan (Patient Must reach out to their plan, if eligible for payment plan), if available.    Ran test claim for linezolid 600 mg and the current 14 day co-pay is $0.00.   This test claim was processed through [facility]- copay amounts may vary at other pharmacies due to pharmacy/plan contracts, or as the patient moves through the different stages of their insurance plan.     Morgan Arab, CPHT Pharmacy Technician III Certified Patient Advocate [facility] Patient Advocate Team Direct Number: [phone]  Fax: [phone]

## 2024-04-17 NOTE — Consult Note (Signed)
 Pharmacy Antibiotic Note  [name] is a 42 y.o. male admitted on 04/14/2024 with bacteremia. PMH includes IVDU, opioid dependence, chronic infected skin ulceration. One set of blood cultures from 6/3 growing MRSA in both bottles; repeat cultures from 6/6 no growth to date after 72 hours. Pharmacy has been consulted for vancomycin  dosing. Patient was in AKI upon admission but renal function has now recovered back to baseline.  Plan: Change vancomycin  dose to 2000mg  IV q12H eAUC 493, Cmax 31, Cmin 15 Scr 0.83, IBW, Vd 0.72 L/kg Levels between 4th and 5th doses of new regimen Continue to monitor and follow-up on culture results.  Height: 6\' 1"  (185.4 cm) IBW/kg (Calculated) : 79.9  Temp (24hrs), Avg:97.9 F (36.6 C), Min:97.7 F (36.5 C), Max:98 F (36.7 C)  Recent Labs  Lab 04/11/24 1217 04/12/24 0405 04/14/24 1753 04/14/24 1754 04/14/24 2005 04/15/24 0452 04/15/24 1147 04/17/24 1146  WBC 30.2* 34.9*  --  11.9*  --   --   --  8.0  CREATININE 1.02 0.93  --  2.15*  --  1.28*  --  0.83  LATICACIDVEN  --   --  2.2*  --  1.7  --   --   --   VANCORANDOM  --   --   --   --   --   --  9  --     Estimated Creatinine Clearance: 131 mL/min (by C-G formula based on SCr of 0.83 mg/dL).    No Known Allergies   Microbiology results: 6/3 BCx: 2 out of 4 bottles (same set) MRSA.  6/6 Bcx: NGTD @ 72 hours   Thank you for allowing pharmacy to be a part of this patient's care.  Will M. Alva Jewels, PharmD Clinical Pharmacist 04/17/2024 1:18 PM

## 2024-04-17 NOTE — Progress Notes (Signed)
 Pt was seen drinking a soda, reminded that he was PO for testing today, states "ok".

## 2024-04-18 ENCOUNTER — Telehealth: Payer: Self-pay | Admitting: Physician Assistant

## 2024-04-18 ENCOUNTER — Telehealth: Payer: Self-pay

## 2024-04-18 NOTE — Telephone Encounter (Signed)
 Attempted call to patient to advise him to return to hospital for MRSA infection of blood. He has left AMA twice now. Unable to LVM - full.

## 2024-04-18 NOTE — Transitions of Care (Post Inpatient/ED Visit) (Unsigned)
   04/18/2024  Name: [name] MRN: [id number] DOB: 1980-12-02  Today's TOC FU Call Status: Today's TOC FU Call Status:: Unsuccessful Call (1st Attempt) Unsuccessful Call (1st Attempt) Date: 04/18/24  Attempted to reach the patient regarding the most recent Inpatient/ED visit.  Follow Up Plan: Additional outreach attempts will be made to reach the patient to complete the Transitions of Care (Post Inpatient/ED visit) call.   [employer], CMA

## 2024-04-19 ENCOUNTER — Telehealth: Payer: Self-pay | Admitting: Physician Assistant

## 2024-04-19 LAB — CULTURE, BLOOD (ROUTINE X 2)
Culture: NO GROWTH
Culture: NO GROWTH

## 2024-04-19 NOTE — Telephone Encounter (Signed)
 Spoke with [name]'s mother [name] today about his MRSA infection. Reviewed that he left the hospital AMA for the second time in a row. She told me that Infectious Disease Dr. Harwood Lingo was able to contact him and while patient refused to complete IV treatment, they compromised on an oral antibiotic regimen with outpatient ID f/u in 14d. Presently I only see an appt for late July, mother says [name] has not made the 14d f/u yet.  We reviewed the seriousness of MRSA blood infection, especially because his is multidrug-resistant.  Emphasized the importance of close follow-up on this and compliance with antibiotic therapy.  Also advised mother that the patient's voicemail was full, which she also knew because she has been trying to reach him as well.

## 2024-04-19 NOTE — Transitions of Care (Post Inpatient/ED Visit) (Signed)
   04/19/2024  Name: [name] MRN: [id number] DOB: 1980/12/26  Today's TOC FU Call Status: Today's TOC FU Call Status:: Unsuccessful Call (2nd Attempt) Unsuccessful Call (1st Attempt) Date: 04/18/24 Unsuccessful Call (2nd Attempt) Date: 04/19/24  Attempted to reach the patient regarding the most recent Inpatient/ED visit.  Follow Up Plan: Additional outreach attempts will be made to reach the patient to complete the Transitions of Care (Post Inpatient/ED visit) call.   [employer], CMA

## 2024-04-19 NOTE — Telephone Encounter (Signed)
 Copied from CRM [id number]. Topic: Medicare AWV >> Apr 19, 2024  9:46 AM Juliana Ocean wrote: Reason for CRM: Called 04/19/2024 to sched AWV - MAILBOX FULL  Rosalee Collins; Care Guide Ambulatory Clinical Support [facility] Direct Dial: [phone]

## 2024-04-20 LAB — MIC RESULT

## 2024-04-20 LAB — MINIMUM INHIBITORY CONC. (1 DRUG)

## 2024-04-20 NOTE — Transitions of Care (Post Inpatient/ED Visit) (Signed)
   04/20/2024  Name: [name] MRN: [id number] DOB: [birth year]  Today's TOC FU Call Status: Today's TOC FU Call Status:: Unsuccessful Call (3rd Attempt) Unsuccessful Call (1st Attempt) Date: 04/18/24 Unsuccessful Call (2nd Attempt) Date: 04/19/24 Unsuccessful Call (3rd Attempt) Date: 04/20/24  Attempted to reach the patient regarding the most recent Inpatient/ED visit.  Follow Up Plan: No further outreach attempts will be made at this time. We have been unable to contact the patient.  [employer], CMA

## 2024-05-05 ENCOUNTER — Other Ambulatory Visit: Payer: Self-pay

## 2024-05-05 ENCOUNTER — Inpatient Hospital Stay
Admission: EM | Admit: 2024-05-05 | Discharge: 2024-05-09 | DRG: 897 | Disposition: A | Discharge status: Home / Self Care | Attending: Internal Medicine | Admitting: Internal Medicine

## 2024-05-05 ENCOUNTER — Encounter: Payer: Self-pay | Admitting: Internal Medicine

## 2024-05-05 DIAGNOSIS — F1123 Opioid dependence with withdrawal: Principal | ICD-10-CM | POA: Diagnosis present

## 2024-05-05 DIAGNOSIS — L97919 Non-pressure chronic ulcer of unspecified part of right lower leg with unspecified severity: Secondary | ICD-10-CM | POA: Diagnosis present

## 2024-05-05 DIAGNOSIS — Z91148 Patient's other noncompliance with medication regimen for other reason: Secondary | ICD-10-CM

## 2024-05-05 DIAGNOSIS — L089 Local infection of the skin and subcutaneous tissue, unspecified: Secondary | ICD-10-CM | POA: Diagnosis present

## 2024-05-05 DIAGNOSIS — F1910 Other psychoactive substance abuse, uncomplicated: Secondary | ICD-10-CM | POA: Diagnosis present

## 2024-05-05 DIAGNOSIS — E876 Hypokalemia: Secondary | ICD-10-CM | POA: Diagnosis present

## 2024-05-05 DIAGNOSIS — R7881 Bacteremia: Secondary | ICD-10-CM | POA: Diagnosis present

## 2024-05-05 DIAGNOSIS — Z8249 Family history of ischemic heart disease and other diseases of the circulatory system: Secondary | ICD-10-CM | POA: Diagnosis not present

## 2024-05-05 DIAGNOSIS — Z833 Family history of diabetes mellitus: Secondary | ICD-10-CM

## 2024-05-05 DIAGNOSIS — Z9081 Acquired absence of spleen: Secondary | ICD-10-CM

## 2024-05-05 DIAGNOSIS — Z6828 Body mass index (BMI) 28.0-28.9, adult: Secondary | ICD-10-CM

## 2024-05-05 DIAGNOSIS — R112 Nausea with vomiting, unspecified: Secondary | ICD-10-CM

## 2024-05-05 DIAGNOSIS — Z79899 Other long term (current) drug therapy: Secondary | ICD-10-CM

## 2024-05-05 DIAGNOSIS — B9562 Methicillin resistant Staphylococcus aureus infection as the cause of diseases classified elsewhere: Secondary | ICD-10-CM | POA: Diagnosis present

## 2024-05-05 DIAGNOSIS — F1410 Cocaine abuse, uncomplicated: Secondary | ICD-10-CM | POA: Diagnosis present

## 2024-05-05 DIAGNOSIS — E663 Overweight: Secondary | ICD-10-CM | POA: Diagnosis present

## 2024-05-05 DIAGNOSIS — F1190 Opioid use, unspecified, uncomplicated: Secondary | ICD-10-CM | POA: Diagnosis present

## 2024-05-05 DIAGNOSIS — F1193 Opioid use, unspecified with withdrawal: Principal | ICD-10-CM | POA: Diagnosis present

## 2024-05-05 DIAGNOSIS — L97929 Non-pressure chronic ulcer of unspecified part of left lower leg with unspecified severity: Secondary | ICD-10-CM | POA: Diagnosis present

## 2024-05-05 DIAGNOSIS — R7303 Prediabetes: Secondary | ICD-10-CM | POA: Diagnosis present

## 2024-05-05 DIAGNOSIS — F1990 Other psychoactive substance use, unspecified, uncomplicated: Secondary | ICD-10-CM | POA: Diagnosis present

## 2024-05-05 DIAGNOSIS — Z8614 Personal history of Methicillin resistant Staphylococcus aureus infection: Secondary | ICD-10-CM | POA: Diagnosis present

## 2024-05-05 LAB — CBC WITH DIFFERENTIAL/PLATELET
Abs Immature Granulocytes: 0.05 10*3/uL (ref 0.00–0.07)
Basophils Absolute: 0 10*3/uL (ref 0.0–0.1)
Basophils Relative: 0 %
Eosinophils Absolute: 0 10*3/uL (ref 0.0–0.5)
Eosinophils Relative: 0 %
HCT: 43.2 % (ref 39.0–52.0)
Hemoglobin: 13.7 g/dL (ref 13.0–17.0)
Immature Granulocytes: 0 %
Lymphocytes Relative: 6 %
Lymphs Abs: 0.8 10*3/uL (ref 0.7–4.0)
MCH: 27.8 pg (ref 26.0–34.0)
MCHC: 31.7 g/dL (ref 30.0–36.0)
MCV: 87.8 fL (ref 80.0–100.0)
Monocytes Absolute: 0.6 10*3/uL (ref 0.1–1.0)
Monocytes Relative: 4 %
Neutro Abs: 11.8 10*3/uL — ABNORMAL HIGH (ref 1.7–7.7)
Neutrophils Relative %: 90 %
Platelets: 327 10*3/uL (ref 150–400)
RBC: 4.92 MIL/uL (ref 4.22–5.81)
RDW: 15 % (ref 11.5–15.5)
WBC: 13.2 10*3/uL — ABNORMAL HIGH (ref 4.0–10.5)
nRBC: 0 % (ref 0.0–0.2)

## 2024-05-05 LAB — URINE DRUG SCREEN, QUALITATIVE (ARMC ONLY)
Amphetamines, Ur Screen: NOT DETECTED
Barbiturates, Ur Screen: NOT DETECTED
Benzodiazepine, Ur Scrn: NOT DETECTED
Cannabinoid 50 Ng, Ur ~~LOC~~: NOT DETECTED
Cocaine Metabolite,Ur ~~LOC~~: POSITIVE — AB
MDMA (Ecstasy)Ur Screen: NOT DETECTED
Methadone Scn, Ur: NOT DETECTED
Opiate, Ur Screen: NOT DETECTED
Phencyclidine (PCP) Ur S: NOT DETECTED
Tricyclic, Ur Screen: NOT DETECTED

## 2024-05-05 LAB — COMPREHENSIVE METABOLIC PANEL WITH GFR
ALT: 14 U/L (ref 0–44)
AST: 30 U/L (ref 15–41)
Albumin: 4.5 g/dL (ref 3.5–5.0)
Alkaline Phosphatase: 89 U/L (ref 38–126)
Anion gap: 14 (ref 5–15)
BUN: 9 mg/dL (ref 6–20)
CO2: 24 mmol/L (ref 22–32)
Calcium: 9.7 mg/dL (ref 8.9–10.3)
Chloride: 100 mmol/L (ref 98–111)
Creatinine, Ser: 1.05 mg/dL (ref 0.61–1.24)
GFR, Estimated: >60 mL/min (ref >60)
Glucose, Bld: 124 mg/dL — ABNORMAL HIGH (ref 70–99)
Potassium: 3.3 mmol/L — ABNORMAL LOW (ref 3.5–5.1)
Sodium: 138 mmol/L (ref 135–145)
Total Bilirubin: 1.2 mg/dL (ref 0.0–1.2)
Total Protein: 9.4 g/dL — ABNORMAL HIGH (ref 6.5–8.1)

## 2024-05-05 LAB — PROTIME-INR
INR: 1.1 (ref 0.8–1.2)
Prothrombin Time: 14.3 s (ref 11.4–15.2)

## 2024-05-05 LAB — MAGNESIUM: Magnesium: 1.9 mg/dL (ref 1.7–2.4)

## 2024-05-05 MED ORDER — SODIUM CHLORIDE 0.9 % IV BOLUS
1000.0000 mL | Freq: Once | INTRAVENOUS | Status: AC
  Administered 2024-05-05: 1000 mL via INTRAVENOUS

## 2024-05-05 MED ORDER — LORAZEPAM 1 MG PO TABS
1.0000 mg | ORAL_TABLET | Freq: Three times a day (TID) | ORAL | Status: DC | PRN
  Administered 2024-05-06 – 2024-05-09 (×5): 1 mg via ORAL
  Filled 2024-05-05 (×5): qty 1

## 2024-05-05 MED ORDER — ACETAMINOPHEN 325 MG PO TABS
650.0000 mg | ORAL_TABLET | Freq: Four times a day (QID) | ORAL | Status: DC | PRN
  Administered 2024-05-06 – 2024-05-08 (×2): 650 mg via ORAL
  Filled 2024-05-05 (×2): qty 2

## 2024-05-05 MED ORDER — VANCOMYCIN HCL IN DEXTROSE 1-5 GM/200ML-% IV SOLN
1000.0000 mg | Freq: Once | INTRAVENOUS | Status: AC
  Administered 2024-05-05: 1000 mg via INTRAVENOUS
  Filled 2024-05-05: qty 200

## 2024-05-05 MED ORDER — BUPRENORPHINE HCL-NALOXONE HCL 8-2 MG SL SUBL
1.0000 | SUBLINGUAL_TABLET | Freq: Once | SUBLINGUAL | Status: AC
  Administered 2024-05-05: 1 via SUBLINGUAL
  Filled 2024-05-05: qty 1

## 2024-05-05 MED ORDER — MORPHINE SULFATE (PF) 4 MG/ML IV SOLN
4.0000 mg | Freq: Once | INTRAVENOUS | Status: AC
  Administered 2024-05-05: 4 mg via INTRAVENOUS
  Filled 2024-05-05: qty 1

## 2024-05-05 MED ORDER — BUPRENORPHINE HCL-NALOXONE HCL 8-2 MG SL SUBL
1.0000 | SUBLINGUAL_TABLET | Freq: Every day | SUBLINGUAL | Status: DC
  Administered 2024-05-06 – 2024-05-09 (×4): 1 via SUBLINGUAL
  Filled 2024-05-05 (×5): qty 1

## 2024-05-05 MED ORDER — ONDANSETRON HCL 4 MG/2ML IJ SOLN
4.0000 mg | Freq: Three times a day (TID) | INTRAMUSCULAR | Status: DC | PRN
  Administered 2024-05-05: 4 mg via INTRAVENOUS
  Filled 2024-05-05: qty 2

## 2024-05-05 MED ORDER — HYDRALAZINE HCL 20 MG/ML IJ SOLN
5.0000 mg | INTRAMUSCULAR | Status: DC | PRN
  Administered 2024-05-05 – 2024-05-07 (×3): 5 mg via INTRAVENOUS
  Filled 2024-05-05 (×3): qty 1

## 2024-05-05 MED ORDER — ENOXAPARIN SODIUM 40 MG/0.4ML IJ SOSY
40.0000 mg | PREFILLED_SYRINGE | INTRAMUSCULAR | Status: DC
  Administered 2024-05-05 – 2024-05-08 (×4): 40 mg via SUBCUTANEOUS
  Filled 2024-05-05 (×4): qty 0.4

## 2024-05-05 MED ORDER — LORAZEPAM 1 MG PO TABS: 1.0000 mg | ORAL_TABLET | Freq: Four times a day (QID) | ORAL | Status: DC | PRN

## 2024-05-05 MED ORDER — POTASSIUM CHLORIDE 10 MEQ/100ML IV SOLN
10.0000 meq | INTRAVENOUS | Status: AC
  Administered 2024-05-05: 10 meq via INTRAVENOUS
  Filled 2024-05-05 (×2): qty 100

## 2024-05-05 MED ORDER — ONDANSETRON HCL 4 MG/2ML IJ SOLN
4.0000 mg | Freq: Once | INTRAMUSCULAR | Status: AC
  Administered 2024-05-05: 4 mg via INTRAVENOUS
  Filled 2024-05-05: qty 2

## 2024-05-05 MED ORDER — HYDROMORPHONE HCL 1 MG/ML IJ SOLN
1.0000 mg | INTRAMUSCULAR | Status: DC | PRN
  Administered 2024-05-05: 1 mg via INTRAVENOUS
  Filled 2024-05-05: qty 1

## 2024-05-05 MED ORDER — POTASSIUM CHLORIDE CRYS ER 20 MEQ PO TBCR
40.0000 meq | EXTENDED_RELEASE_TABLET | Freq: Once | ORAL | Status: DC
  Filled 2024-05-05: qty 2

## 2024-05-05 NOTE — ED Notes (Signed)
 Pt unable to take oral potassium due to nausea. Dr Hilma notified at bedside.

## 2024-05-05 NOTE — Progress Notes (Signed)
   05/05/24 1545  Spiritual Encounters  Type of Visit Initial  Care provided to: Pt and family  Conversation partners present during encounter Nurse;Physician  Referral source Chaplain assessment  Reason for visit Routine spiritual support  Interventions  Spiritual Care Interventions Made Established relationship of care and support;Compassionate presence;Prayer  Intervention Outcomes  Outcomes Connection to spiritual care;Awareness around self/spiritual resourses  Spiritual Care Plan  Spiritual Care Issues Still Outstanding No further spiritual care needs at this time (see row info)   Pt's father asked for a blanket and prayer. Chaplain acquired two blankets and prayed for pt and family.

## 2024-05-05 NOTE — ED Triage Notes (Signed)
 First nurse note: Arrived by [facility]. Left AMA X1 week ago. C/o shakiness and N/V. Uses IV fentanyl. Reports fentanyl is from new source.   1g fentanyl this AM. IV use  Currently has MRSA. Not taking his antibiotics  EMS vitals: 171/93 b/p 100% RA 85HR

## 2024-05-05 NOTE — ED Notes (Addendum)
 4 unsuccessful IV attempt. IV team consult placed.

## 2024-05-05 NOTE — ED Provider Notes (Signed)
 [facility] Provider Note    Event Date/Time   First MD Initiated Contact with Patient 05/05/24 1522     (approximate)   History   Chief Complaint Withdrawal   HPI  [name] is a 43 y.o. male with past medical history of IV drug use, chronic lower extremity ulceration, and asplenia who presents to the ED complaining of withdrawal.  Patient reports that his last use of IV fentanyl was earlier this morning, and he has since developed withdrawal symptoms including malaise, hiccups, nausea, and vomiting.  He denies drug use outside of fentanyl, denies alcohol consumption.  He recently signed out of the hospital AMA following admission for MRSA bacteremia, has since been prescribed linezolid by infectious disease and states he has been compliant with this medication.  Patient reports that ulcers to his lower extremities have been healing well.     Physical Exam   Triage Vital Signs: ED Triage Vitals  Encounter Vitals Group     BP 05/05/24 1520 (!) 151/109     Girls Systolic BP Percentile --      Girls Diastolic BP Percentile --      Boys Systolic BP Percentile --      Boys Diastolic BP Percentile --      Pulse Rate 05/05/24 1520 85     Resp 05/05/24 1520 20     Temp 05/05/24 1520 98.2 F (36.8 C)     Temp Source 05/05/24 1520 Oral     SpO2 05/05/24 1520 100 %     Weight 05/05/24 1516 205 lb 14.6 oz (93.4 kg)     Height --      Head Circumference --      Peak Flow --      Pain Score 05/05/24 1516 9     Pain Loc --      Pain Education --      Exclude from Growth Chart --     Most recent vital signs: Vitals:   05/05/24 1830 05/05/24 1900  BP: (!) 179/78   Pulse: 65   Resp:    Temp:  98 F (36.7 C)  SpO2: 100%     Constitutional: Alert and oriented.  Diaphoretic with hiccups. Eyes: Conjunctivae are normal. Head: Atraumatic. Nose: No congestion/rhinnorhea. Mouth/Throat: Mucous membranes are moist.  Cardiovascular: Normal rate, regular  rhythm. Grossly normal heart sounds.  2+ radial pulses bilaterally. Respiratory: Normal respiratory effort.  No retractions. Lungs CTAB. Gastrointestinal: Soft and nontender. No distention. Musculoskeletal: Medial lateral ulcerations to the right calf that are healing with no erythema, edema, or drainage.  Laceration to medial left calf that is healing but ulceration to left lateral calf with edema, erythema, and purulent drainage without focal fluctuance. Neurologic:  Normal speech and language. No gross focal neurologic deficits are appreciated.    ED Results / Procedures / Treatments   Labs (all labs ordered are listed, but only abnormal results are displayed) Labs Reviewed  CBC WITH DIFFERENTIAL/PLATELET - Abnormal; Notable for the following components:      Result Value   WBC 13.2 (*)    Neutro Abs 11.8 (*)    All other components within normal limits  COMPREHENSIVE METABOLIC PANEL WITH GFR - Abnormal; Notable for the following components:   Potassium 3.3 (*)    Glucose, Bld 124 (*)    Total Protein 9.4 (*)    All other components within normal limits  URINE DRUG SCREEN, QUALITATIVE ([facility] ONLY) - Abnormal; Notable for the following  components:   Cocaine Metabolite,Ur  POSITIVE (*)    All other components within normal limits  CULTURE, BLOOD (ROUTINE X 2)  CULTURE, BLOOD (ROUTINE X 2)  MAGNESIUM   PHOSPHORUS     EKG  ED ECG REPORT I, Carlin Palin, the attending physician, personally viewed and interpreted this ECG.   Date: 05/05/2024  EKG Time: 15:49  Rate: 70  Rhythm: normal sinus rhythm  Axis: RAD  Intervals:none  ST&T Change: None  PROCEDURES:  Critical Care performed: No  Procedures   MEDICATIONS ORDERED IN ED: Medications  morphine (PF) 4 MG/ML injection 4 mg (has no administration in time range)  vancomycin  (VANCOCIN ) IVPB 1000 mg/200 mL premix (has no administration in time range)  potassium chloride  SA (KLOR-CON  M) CR tablet 40 mEq (has no  administration in time range)  buprenorphine -naloxone  (SUBOXONE ) 8-2 mg per SL tablet 1 tablet (has no administration in time range)  buprenorphine -naloxone  (SUBOXONE ) 8-2 mg per SL tablet 1 tablet (1 tablet Sublingual Given 05/05/24 1545)  ondansetron  (ZOFRAN ) injection 4 mg (4 mg Intravenous Given 05/05/24 1613)  sodium chloride  0.9 % bolus 1,000 mL (0 mLs Intravenous Stopped 05/05/24 1807)  buprenorphine -naloxone  (SUBOXONE ) 8-2 mg per SL tablet 1 tablet (1 tablet Sublingual Given 05/05/24 1810)     IMPRESSION / MDM / ASSESSMENT AND PLAN / ED COURSE  I reviewed the triage vital signs and the nursing notes.                              43 y.o. male with past medical history of IV drug use, chronic lower extremity ulceration, and asplenia who presents to the ED complaining of nausea and vomiting associated with opiate withdrawal since this morning.  Patient's presentation is most consistent with acute presentation with potential threat to life or bodily function.  Differential diagnosis includes, but is not limited to, opiate withdrawal, dehydration, electrolyte abnormality, AKI, recurrent bacteremia, cellulitis.  Patient ill-appearing but in no acute distress, vital signs are reassuring and do not appear concerning for sepsis.  He does appear to be in florid opiate withdrawal, actively vomiting and we will give IV Zofran , attempted dose Suboxone  for withdrawal symptoms.  Patient has been following regularly with Dr. Epifanio of infectious disease, and I reviewed the case with him over the phone.  Patient was recently started on Cipro in addition to linezolid for ongoing treatment of MRSA bacteremia and lower extremity cellulitis.  Given no findings concerning for sepsis, Dr. Epifanio agrees with plan for repeat blood cultures but does not feel patient necessitates admission for ongoing treatment of infection.  He does recommend assessing for cytopenia with CBC which can be found with linezolid  treatment.  Plan to reassess following labs and treatment.  Labs with mild leukocytosis but no significant anemia, electrolyte abnormality, or AKI.  LFTs are unremarkable, but patient continues to feel nauseous with vomiting despite IV Zofran  and multiple doses of Suboxone .  Given he would be unable to tolerate his oral linezolid, will admit for intractable nausea and vomiting and ongoing treatment of MRSA bacteremia.  Patient given IV dose of vancomycin , case discussed with hospitalist for admission.      FINAL CLINICAL IMPRESSION(S) / ED DIAGNOSES   Final diagnoses:  Opiate withdrawal (HCC)  MRSA bacteremia  Intractable vomiting with nausea     Rx / DC Orders   ED Discharge Orders     None        Note:  This document was prepared using Dragon voice recognition software and may include unintentional dictation errors.   Willo Dunnings, MD 05/05/24 [phone]

## 2024-05-05 NOTE — H&P (Signed)
 History and Physical    [name] FMW:[id number] DOB: 1981/03/19 DOA: 05/05/2024  Referring MD/NP/PA:   PCP: [name] SQUIBB, PA   Patient coming from:  The patient is coming from home.     Chief Complaint: Nausea, vomiting, hiccups, diaphoresis, shaking, pain all over  HPI: [name] is a 43 y.o. 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  Patient was recently hospitalized from 6/6 - 6/9 due to MRSA bacteremia. That admission was recommended by Dr. Epifanio of ID for completing his inpatient treatment for MRSA bacteremia. Pt was treated with IV vancomycin  in hospitqal, however, he left AMA again on 6/9 without warning.  2D echo was negative for vegetation. Pt states that he has been prescribed linezolid by infectious disease and states he has been compliant with this medication. Patient reports that ulcers to his lower extremities have been in healing process.  Patient reports that his last use of IV fentanyl was earlier this morning, and he has since developed withdrawal symptoms including malaise, hiccups, nausea, vomiting, diaphoresis, shaking, pain all over.  No diarrhea, cough, SOB.  No symptoms of UTI.  No fever or chills.  He states that he cannot keep anything down, including medications due to severe nausea and vomiting.   Data reviewed independently and ED Course: pt was found to have WBC 13.2, GFR> 60, potassium 3.3, magnesium  1.9, positive UDS for cocaine. Temperature normal, blood pressure 179/78, heart rate 99 --> 65, RR 24 ---> 18, oxygen saturation 100% on room air.  Patient is admitted to MedSurg bed as inpatient.   EKG: I have personally reviewed.  Sinus rhythm, QTc 422, with a lot of artificial  effects.   Review of Systems:   General: no fevers, chills, no body weight gain, has poor appetite, has fatigue HEENT: no blurry vision, hearing changes or sore throat Respiratory: no dyspnea, coughing, wheezing CV: has chest pain, no palpitations GI: has nausea, vomiting, abdominal pain, no diarrhea, constipation GU: no dysuria, burning on urination, increased urinary frequency, hematuria  Ext: no leg edema Neuro: no unilateral weakness, numbness, or tingling, no vision change or hearing loss Skin: has chronic skin ulcers in both legs MSK: has pain all over Heme: No easy bruising.  Travel history: No recent long distant travel.   Allergy: No Known Allergies  Past Medical History:  Diagnosis Date   Allergy    Opioid abuse (HCC) 03/2019   on-going daily use of IV Fentanyl    Past Surgical History:  Procedure Laterality Date   SPLENECTOMY      Social History:  reports that he has never smoked. He has never used smokeless tobacco. He reports current drug use. Drugs: IV, Cocaine, and Fentanyl. He reports that he does not drink alcohol.  Family History:  Family History  Problem Relation Age of Onset   Hypertension Mother    Diabetes Mother      Prior to Admission medications   Medication Sig Start Date End Date Taking? Authorizing Provider  albendazole  (ALBENZA ) 200 MG tablet Take 200 mg by mouth 2 (two) times daily with a meal. Patient not taking: Reported on 04/11/2024 11/22/23   [provider]  cephALEXin  (KEFLEX ) 500 MG capsule Take 500 mg by mouth every 12 (twelve) hours. Patient not taking: Reported on 04/11/2024 01/26/24  [provider]  ciprofloxacin (CIPRO) 250 MG tablet Take 250 mg by mouth 2 (two) times daily. Patient not taking: Reported on 04/11/2024 03/01/24   [provider]  doxycycline  (ADOXA) 100 MG tablet Take 1 tablet (100 mg total) by mouth 2 (two) times daily. Patient not taking: Reported on 04/11/2024 12/06/23   Floy Roberts,  MD  venlafaxine  XR (EFFEXOR  XR) 75 MG 24 hr capsule Take 1 capsule (75 mg total) by mouth daily with breakfast. Patient not taking: Reported on 04/11/2024 10/27/23   [name] SQUIBB, PA    Physical Exam: Vitals:   05/05/24 1600 05/05/24 1830 05/05/24 1900 05/05/24 1930  BP: (!) 180/94 (!) 179/78 (!) 177/103 (!) 168/82  Pulse: 88 65 65 67  Resp: 18     Temp:   98 F (36.7 C)   TempSrc:   Oral   SpO2: 100% 100% 100% 100%  Weight:       General: pt is in moderate acute distress due to pain all over, with diaphoresis HEENT:       Eyes: PERRL, EOMI, no jaundice       ENT: No discharge from the ears and nose, no pharynx injection, no tonsillar enlargement.        Neck: No JVD, no bruit, no mass felt. Heme: No neck lymph node enlargement. Cardiac: S1/S2, RRR, No gallops or rubs. Respiratory: No rales, wheezing, rhonchi or rubs. GI: Soft, nondistended, nontender, no rebound pain, no organomegaly, BS present. GU: No hematuria Ext: No pitting leg edema bilaterally. 1+DP/PT pulse bilaterally. Musculoskeletal: Has tenderness of the lower Skin: Has chronic ulcers in both legs, in healing process, with granulation, no pus drainage       Neuro: Alert, oriented X3, cranial nerves II-XII grossly intact, moves all extremities  Psych: Patient is not psychotic, no suicidal or hemocidal ideation.  Labs on Admission: I have personally reviewed following labs and imaging studies  CBC: Recent Labs  Lab 05/05/24 1548  WBC 13.2*  NEUTROABS 11.8*  HGB 13.7  HCT 43.2  MCV 87.8  PLT 327   Basic Metabolic Panel: Recent Labs  Lab 05/05/24 1539 05/05/24 1548  NA  --  138  K  --  3.3*  CL  --  100  CO2  --  24  GLUCOSE  --  124*  BUN  --  9  CREATININE  --  1.05  CALCIUM   --  9.7  MG 1.9  --   PHOS  --  <1.0*   GFR: Estimated Creatinine Clearance: 103.6 mL/min (by C-G formula based on SCr of 1.05 mg/dL). Liver Function Tests: Recent Labs  Lab 05/05/24 1548  AST 30  ALT 14   ALKPHOS 89  BILITOT 1.2  PROT 9.4*  ALBUMIN 4.5   No results for input(s): LIPASE, AMYLASE in the last 168 hours. No results for input(s): AMMONIA in the last 168 hours. Coagulation Profile: No results for input(s): INR, PROTIME in the last 168 hours. Cardiac Enzymes: No results for input(s): CKTOTAL, CKMB, CKMBINDEX, TROPONINI in the last 168 hours. BNP (last 3 results) No results for input(s): PROBNP in the last 8760 hours. HbA1C: No results for input(s): HGBA1C in the last 72 hours. CBG: No results for input(s): GLUCAP in the last 168 hours. Lipid Profile: No results for input(s): CHOL, HDL, LDLCALC, TRIG, CHOLHDL, LDLDIRECT in the last 72 hours. Thyroid  Function Tests: No results for input(s): TSH, T4TOTAL, FREET4, T3FREE, THYROIDAB in the last 72 hours. Anemia Panel: No results for input(s):  VITAMINB12, FOLATE, FERRITIN, TIBC, IRON, RETICCTPCT in the last 72 hours. Urine analysis:    Component Value Date/Time   COLORURINE YELLOW (A) 04/14/2024 2005   APPEARANCEUR CLEAR (A) 04/14/2024 2005   LABSPEC 1.012 04/14/2024 2005   PHURINE 5.0 04/14/2024 2005   GLUCOSEU NEGATIVE 04/14/2024 2005   HGBUR NEGATIVE 04/14/2024 2005   BILIRUBINUR NEGATIVE 04/14/2024 2005   BILIRUBINUR Negative 05/19/2023 1457   KETONESUR NEGATIVE 04/14/2024 2005   PROTEINUR NEGATIVE 04/14/2024 2005   UROBILINOGEN 0.2 05/19/2023 1457   NITRITE NEGATIVE 04/14/2024 2005   LEUKOCYTESUR NEGATIVE 04/14/2024 2005   Sepsis Labs: @LABRCNTIP (procalcitonin:4,lacticidven:4) )No results found for this or any previous visit (from the past 240 hours).   Radiological Exams on Admission:   Assessment/Plan Principal Problem:   Opioid withdrawal (HCC) Active Problems:   MRSA bacteremia   Opioid use disorder   Polysubstance abuse (HCC)   IVDU (intravenous drug user)   Hypokalemia   Infected ulcer of skin (HCC)   Overweight (BMI  25.0-29.9)   Assessment and Plan:   Opioid withdrawal (HCC):  pt has malaise, hiccups, nausea, vomiting, diaphoresis, shaking, pain all over, elevated blood pressure, 179/78, clinically consistent with opioid withdrawal.  Patient had used IV fentanyl in the morning.  Patient received 2 doses of Suboxone  in ED without improvement.  -Admitted to MedSurg bed as inpatient - Patient was given 2 dose of Suboxone  in ED, will continue Suboxone  daily - As needed Dilaudid - As needed Ativan  - IV hydralazine as needed for hypertension -IVF: 2L of NS bolus  Opioid use disorder, polysubstance abuse, IVDU (intravenous drug user): UDS positive for cocaine. - Data counseling about importance of quitting substance use - See above for opioid withdrawal  MRSA bacteremia: WBC 13.2, no fever. - Switched to IV vancomycin  since patient cannot take oral medications due to nausea and vomiting - Repeat blood culture  Chronic skin ulcer in both legs: - Wound care consult  Hypokalemia: Potassium 3.3, magnesium  1.9 -Repleted potassium with IV potassium chloride  20 mEq - Check phosphorus level  Overweight (BMI 25.0-29.9): Body weight 93.4 kg, BMI 27.17 - Encourage losing weight - Exercise and healthy diet    DVT ppx: SQ Lovenox   Code Status: Full code  Family Communication:  Yes, patient's parents at bed side.     Disposition Plan:  Anticipate discharge back to previous environment  Consults called:  none  Admission status and Level of care: Med-Surg:   as inpt        Dispo: The patient is from: Home              Anticipated d/c is to: Home              Anticipated d/c date is: 2 days              Patient currently is not medically stable to d/c.    Severity of Illness:  The appropriate patient status for this patient is INPATIENT. Inpatient status is judged to be reasonable and necessary in order to provide the required intensity of service to ensure the patient's safety. The patient's  presenting symptoms, physical exam findings, and initial radiographic and laboratory data in the context of their chronic comorbidities is felt to place them at high risk for further clinical deterioration. Furthermore, it is not anticipated that the patient will be medically stable for discharge from the hospital within 2 midnights of admission.   * I certify that at the point of admission it is my clinical  judgment that the patient will require inpatient hospital care spanning beyond 2 midnights from the point of admission due to high intensity of service, high risk for further deterioration and high frequency of surveillance required.*       Date of Service 05/05/2024    Caleb Exon [facility]   If 7PM-7AM, please contact night-coverage [email or web address] 05/05/2024, 8:29 PM

## 2024-05-05 NOTE — ED Notes (Signed)
 Unable to get second set of cultures. Lab called.

## 2024-05-05 NOTE — Progress Notes (Signed)
 IVT consult placed due to USGPIV placed in ED not working well. Coban that was wrapped around site was removed, dressing changed and PIV flushed vigorously without complications. Patient requested continued use of PIV in place rather than starting new line.   Jerra Huckeby R Jeret Goyer, RN

## 2024-05-05 NOTE — ED Triage Notes (Signed)
 Diaphoretic, vomiting

## 2024-05-06 DIAGNOSIS — F1193 Opioid use, unspecified with withdrawal: Secondary | ICD-10-CM | POA: Diagnosis not present

## 2024-05-06 LAB — GLUCOSE, CAPILLARY: Glucose-Capillary: 150 mg/dL — ABNORMAL HIGH (ref 70–99)

## 2024-05-06 LAB — PHOSPHORUS: Phosphorus: <1 mg/dL — CL (ref 2.5–4.6)

## 2024-05-06 MED ORDER — ONDANSETRON HCL 4 MG/2ML IJ SOLN: 4.0000 mg | Freq: Four times a day (QID) | INTRAMUSCULAR | Status: DC | PRN

## 2024-05-06 MED ORDER — METOCLOPRAMIDE HCL 5 MG/ML IJ SOLN
10.0000 mg | Freq: Three times a day (TID) | INTRAMUSCULAR | Status: DC
  Administered 2024-05-06 – 2024-05-08 (×5): 10 mg via INTRAVENOUS
  Filled 2024-05-06 (×5): qty 2

## 2024-05-06 MED ORDER — POTASSIUM CHLORIDE 10 MEQ/100ML IV SOLN
10.0000 meq | Freq: Once | INTRAVENOUS | Status: AC
  Administered 2024-05-06: 10 meq via INTRAVENOUS
  Filled 2024-05-06: qty 100

## 2024-05-06 MED ORDER — CHLORPROMAZINE HCL 25 MG PO TABS
25.0000 mg | ORAL_TABLET | Freq: Three times a day (TID) | ORAL | Status: DC
  Administered 2024-05-06 – 2024-05-07 (×4): 25 mg via ORAL
  Filled 2024-05-06 (×4): qty 1

## 2024-05-06 MED ORDER — METHOCARBAMOL 500 MG PO TABS
750.0000 mg | ORAL_TABLET | Freq: Four times a day (QID) | ORAL | Status: DC
  Administered 2024-05-06 – 2024-05-09 (×12): 750 mg via ORAL
  Filled 2024-05-06 (×12): qty 2

## 2024-05-06 MED ORDER — POTASSIUM PHOSPHATES 15 MMOLE/5ML IV SOLN
15.0000 mmol | Freq: Once | INTRAVENOUS | Status: AC
  Administered 2024-05-06: 15 mmol via INTRAVENOUS
  Filled 2024-05-06: qty 5

## 2024-05-06 MED ORDER — LACTATED RINGERS IV SOLN: INTRAVENOUS | Status: AC

## 2024-05-06 MED ORDER — VANCOMYCIN HCL 1750 MG/350ML IV SOLN
1750.0000 mg | Freq: Once | INTRAVENOUS | Status: AC
  Administered 2024-05-06: 1750 mg via INTRAVENOUS
  Filled 2024-05-06: qty 350

## 2024-05-06 MED ORDER — CLONIDINE HCL 0.1 MG PO TABS
0.2000 mg | ORAL_TABLET | Freq: Two times a day (BID) | ORAL | Status: DC
  Administered 2024-05-06 – 2024-05-08 (×5): 0.2 mg via ORAL
  Filled 2024-05-06 (×5): qty 2

## 2024-05-06 MED ORDER — VANCOMYCIN HCL 1500 MG/300ML IV SOLN
1500.0000 mg | Freq: Two times a day (BID) | INTRAVENOUS | Status: DC
  Administered 2024-05-06 – 2024-05-08 (×4): 1500 mg via INTRAVENOUS
  Filled 2024-05-06 (×4): qty 300

## 2024-05-06 NOTE — Progress Notes (Signed)
 PROGRESS NOTE    [name]  FMW:[id number] DOB: December 24, 1980 DOA: 05/05/2024 PCP: [name] SQUIBB, PA    Brief Narrative:  43 y.o. 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   Patient was recently hospitalized from 6/6 - 6/9 due to MRSA bacteremia. That admission was recommended by Dr. Epifanio of ID for completing his inpatient treatment for MRSA bacteremia. Pt was treated with IV vancomycin  in hospitqal, however, he left AMA again on 6/9 without warning.  2D echo was negative for vegetation. Pt states that he has been prescribed linezolid by infectious disease and states he has been compliant with this medication. Patient reports that ulcers to his lower extremities have been in healing process.   Patient reports that his last use of IV fentanyl was earlier this morning, and he has since developed withdrawal symptoms including malaise, hiccups, nausea, vomiting, diaphoresis, shaking, pain all over.  No diarrhea, cough, SOB.  No symptoms of UTI.  No fever or chills.  He states that he cannot keep anything down, including medications due to severe nausea and vomiting.  Assessment & Plan:   Principal Problem:   Opioid withdrawal (HCC) Active Problems:   MRSA bacteremia   Opioid use disorder   Polysubstance abuse (HCC)   IVDU (intravenous drug user)   Hypokalemia   Infected ulcer of skin (HCC)   Overweight (BMI 25.0-29.9)   Hypophosphatemia   Opioid withdrawal (HCC):  pt has malaise, hiccups, nausea, vomiting, diaphoresis, shaking, pain all over, elevated blood pressure, 179/78, clinically consistent with opioid withdrawal.  Patient admits to IV fentanyl use.  States uses 3 g IV per day. Had a lengthy conversation with the  patient explaining that he is in severe opioid withdrawal and while it may be very difficult and painful it is inherently not hazardous to his life.  He expresses desire to discontinue use of IV fentanyl. Plan: COWS protocol Discontinue IV Dilaudid Continue Suboxone  Add clonidine  0.2 mg twice daily Aggressive IV fluids As needed Ativan  Scheduled Reglan  Scheduled Robaxin Scheduled Thorazine  Opioid use disorder, polysubstance abuse, IVDU (intravenous drug user):  UDS positive for cocaine. Plan: - Data counseling about importance of quitting substance use - Continue opioid withdrawal protocol as above   MRSA bacteremia:  WBC 13.2, no fever. Plan: IV vancomycin  for now Switch back to linezolid when able to tolerate p.o. Follow repeat blood cultures  Chronic skin ulcer in both legs: Appreciate WOC recommendations   Hypokalemia:  Potassium 3.3, magnesium  1.9 Monitor daily labs and replete as needed   Overweight (BMI 25.0-29.9): Body weight 93.4 kg, BMI 27.17 Complicating factor in overall care and prognosis     DVT prophylaxis: SQ Lovenox  Code Status: Full Family Communication: Disposition Plan: Status is: Inpatient Remains inpatient appropriate because: Severe opiate withdrawal   Level of care: Med-Surg  Consultants:  None  Procedures:  None  Antimicrobials: IV vancomycin     Subjective: Seen and examined.  Very shaky.  Has feels nauseous with diffuse pain.0  Objective: Vitals:   05/06/24 0512 05/06/24 0538 05/06/24 0922 05/06/24 1124  BP: (!) 156/77 (!) 156/77 (!) 159/83 132/65  Pulse: (!) 113 (!) 102 (!) 136 (!) 140  Resp: 20 19 16 16   Temp: 98.7 F (37.1 C) 98.7 F (37.1 C) 99.1 F (37.3 C) 99.4 F (37.4  C)  TempSrc:      SpO2: 100% 100% 100% 100%  Weight:      Height:        Intake/Output Summary (Last 24 hours) at 05/06/2024 1132 Last data filed at 05/06/2024 1036 Gross per 24 hour  Intake 1551.79 ml  Output 700 ml  Net 851.79 ml    Filed Weights   05/05/24 1516 05/06/24 0037  Weight: 93.4 kg 94.5 kg    Examination:  General exam: Appears diaphoretic, hiccups, very uncomfortable Respiratory system: Gimmick.  Clear.  Room air Cardiovascular system: S1-S2, tachycardic, no murmurs Gastrointestinal system: Soft, NT/ND, hyperactive bowel sounds Central nervous system: Alert and oriented. No focal neurological deficits. Extremities: Symmetric 5 x 5 power. Skin: No rashes, lesions or ulcers Psychiatry: Judgement and insight appear impaired. Mood & affect anxious.     Data Reviewed: I have personally reviewed following labs and imaging studies  CBC: Recent Labs  Lab 05/05/24 1548  WBC 13.2*  NEUTROABS 11.8*  HGB 13.7  HCT 43.2  MCV 87.8  PLT 327   Basic Metabolic Panel: Recent Labs  Lab 05/05/24 1539 05/05/24 1548  NA  --  138  K  --  3.3*  CL  --  100  CO2  --  24  GLUCOSE  --  124*  BUN  --  9  CREATININE  --  1.05  CALCIUM   --  9.7  MG 1.9  --   PHOS  --  <1.0*   GFR: Estimated Creatinine Clearance: 109.4 mL/min (by C-G formula based on SCr of 1.05 mg/dL). Liver Function Tests: Recent Labs  Lab 05/05/24 1548  AST 30  ALT 14  ALKPHOS 89  BILITOT 1.2  PROT 9.4*  ALBUMIN 4.5   No results for input(s): LIPASE, AMYLASE in the last 168 hours. No results for input(s): AMMONIA in the last 168 hours. Coagulation Profile: Recent Labs  Lab 05/05/24 2103  INR 1.1   Cardiac Enzymes: No results for input(s): CKTOTAL, CKMB, CKMBINDEX, TROPONINI in the last 168 hours. BNP (last 3 results) No results for input(s): PROBNP in the last 8760 hours. HbA1C: No results for input(s): HGBA1C in the last 72 hours. CBG: Recent Labs  Lab 05/06/24 0449  GLUCAP 150*   Lipid Profile: No results for input(s): CHOL, HDL, LDLCALC, TRIG, CHOLHDL, LDLDIRECT in the last 72 hours. Thyroid  Function Tests: No results for input(s): TSH, T4TOTAL, FREET4, T3FREE,  THYROIDAB in the last 72 hours. Anemia Panel: No results for input(s): VITAMINB12, FOLATE, FERRITIN, TIBC, IRON, RETICCTPCT in the last 72 hours. Sepsis Labs: No results for input(s): PROCALCITON, LATICACIDVEN in the last 168 hours.  Recent Results (from the past 240 hours)  Culture, blood (routine x 2)     Status: None (Preliminary result)   Collection Time: 05/05/24  3:48 PM   Specimen: BLOOD  Result Value Ref Range Status   Specimen Description BLOOD LEFT ANTECUBITAL  Final   Special Requests   Final    BOTTLES DRAWN AEROBIC AND ANAEROBIC Blood Culture results may not be optimal due to an inadequate volume of blood received in culture bottles   Culture   Final    NO GROWTH < 24 HOURS Performed at [facility], [address] [place], [place] [address]    Report Status PENDING  Incomplete  Culture, blood (routine x 2)     Status: None (Preliminary result)   Collection Time: 05/05/24  9:03 PM   Specimen: BLOOD  Result Value Ref Range Status  Specimen Description BLOOD BLOOD LEFT HAND  Final   Special Requests   Final    BOTTLES DRAWN AEROBIC AND ANAEROBIC Blood Culture results may not be optimal due to an inadequate volume of blood received in culture bottles   Culture   Final    NO GROWTH < 12 HOURS Performed at [facility], [address], [place], [place] [address]    Report Status PENDING  Incomplete         Radiology Studies: No results found.      Scheduled Meds:  buprenorphine -naloxone   1 tablet Sublingual Daily   chlorproMAZINE  25 mg Oral TID   cloNIDine   0.2 mg Oral BID   enoxaparin  (LOVENOX ) injection  40 mg Subcutaneous Q24H   methocarbamol  750 mg Oral QID   metoCLOPramide  (REGLAN ) injection  10 mg Intravenous Q8H   Continuous Infusions:  lactated ringers  125 mL/hr at 05/06/24 1016   vancomycin  1,750 mg (05/06/24 1019)   Followed by   vancomycin        LOS: 1 day     Calvin KATHEE Robson, MD [facility]   If 7PM-7AM, please contact night-coverage  05/06/2024, 11:32 AM

## 2024-05-06 NOTE — Progress Notes (Signed)
 Patient lost iv at 3am ,tried to insert iv , but he is hard stick , iv team was not available at this time. Called ICU for help , but no one came for help.  [place] , [place]

## 2024-05-06 NOTE — Consult Note (Signed)
 Pharmacy Antibiotic Note  [name] is a 43 y.o. 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  Pharmacy has been consulted for vancomycin  dosing.  Plan:  Give vancomycin  1.75 g IV x 1 dose in addition to 1 g dose given last night for loading and then start maintenance regimen of vancomycin  1.5 g IV q12h --Calculated AUC: 501, Cmin: 13.4 --Daily Scr per protocol --Levels at steady state or as clinically indicated  Height: 6' (182.9 cm) Weight: 94.5 kg (208 lb 5.4 oz) IBW/kg (Calculated) : 77.6  Temp (24hrs), Avg:98.9 F (37.2 C), Min:98 F (36.7 C), Max:100.4 F (38 C)  Recent Labs  Lab 05/05/24 1548  WBC 13.2*  CREATININE 1.05    Estimated Creatinine Clearance: 109.4 mL/min (by C-G formula based on SCr of 1.05 mg/dL).    No Known Allergies  Antimicrobials this admission: Vancomycin  6/27 >>   Dose adjustments this admission: N/A  Microbiology results: 6/27 BCx: NGTD  Thank you for allowing pharmacy to be a part of this patient's care.  [name] 05/06/2024 8:33 AM

## 2024-05-06 NOTE — Plan of Care (Signed)

## 2024-05-06 NOTE — Consult Note (Signed)
 WOC Nurse Consult Note: patient is familiar to [facility] team from previous admissions with B lower leg ulcers; wounds overall appear much improved from previous admissions  Reason for Consult: ulcers B legs  Wound type: full thickness ulcerations reported on past admissions r/t injecting drugs  Pressure Injury POA: NA not pressure  Measurement: see nursing flowsheet  Wound bed:  largely clean  Drainage (amount, consistency, odor) no purulent drainage per MD note  Periwound: intact  Dressing procedure/placement/frequency: Cleanse B lower leg ulcers with Vashe wound cleanser Soila [phone]) do not rinse and allow to air dry. Apply Vashe moistened gauze to wound beds daily, cover with dry gauze and secure with silicone foam or ABD pad and Kerlix whichever is preferred by patient.   Patient is followed by infectious disease and [facility] made per their most recent note 05/03/2024.   POC discussed with bedside nurse WOC team will not follow. Re-consult if further needs arise.   Thank you,    Powell Bar MSN, RN-BC, [employer]

## 2024-05-06 NOTE — Progress Notes (Signed)
 Upon entering the pt's room with [facility] NT, pt lying in bed with both eyes closed, verbally addressed the Pt advising him that we wanted to check him to make sure he has not been incontinent of urine, pulled the cover back, pt dry; asked the pt if he has voided this shift, he said no; advised the pt that if he can not urinate then we would need to perform a bladder scan to see how much urine his bladder is holding; the pt verbally asked us  to leave him alone; advised the pt that his urinal was hanging on the side rail when he has voided in the urinal to use his nurse call bell to advise staff in order for staff to measure and record

## 2024-05-06 NOTE — Progress Notes (Signed)
 MEWS Progress Note  Patient Details Name: [name] MRN: [id number] DOB: 08-02-1981 Today's Date: 05/06/2024   MEWS Flowsheet Documentation:  Assess: MEWS Score Temp: 99.4 F (37.4 C) BP: 132/65 MAP (mmHg): 83 Pulse Rate: (!) 140 ECG Heart Rate: 66 Resp: 16 Level of Consciousness: Alert SpO2: 100 % O2 Device: Room Air Assess: MEWS Score MEWS Temp: 0 MEWS Systolic: 0 MEWS Pulse: 3 MEWS RR: 0 MEWS LOC: 0 MEWS Score: 3 MEWS Score Color: Yellow Assess: SIRS CRITERIA SIRS Temperature : 0 SIRS Respirations : 0 SIRS Pulse: 1 SIRS WBC: 0 SIRS Score Sum : 1 Assess: if the MEWS score is Yellow or Red Were vital signs accurate and taken at a resting state?: Yes Does the patient meet 2 or more of the SIRS criteria?: No MEWS guidelines implemented : Yes, yellow Treat MEWS Interventions: Considered administering scheduled or prn medications/treatments as ordered Take Vital Signs Increase Vital Sign Frequency : Yellow: Q2hr x1, continue Q4hrs until patient remains green for 12hrs Escalate MEWS: Escalate: Yellow: Discuss with charge nurse and consider notifying provider and/or RRT    Will continue to monitor and assess pt    Catheryn Macario Ned 05/06/2024, 12:31 PM

## 2024-05-06 NOTE — Plan of Care (Signed)
   Problem: Activity: Goal: Risk for activity intolerance will decrease Outcome: Progressing   Problem: Pain Managment: Goal: General experience of comfort will improve and/or be controlled Outcome: Progressing   Problem: Safety: Goal: Ability to remain free from injury will improve Outcome: Progressing

## 2024-05-07 DIAGNOSIS — F1193 Opioid use, unspecified with withdrawal: Secondary | ICD-10-CM | POA: Diagnosis not present

## 2024-05-07 MED ORDER — CHLORPROMAZINE HCL 50 MG PO TABS
50.0000 mg | ORAL_TABLET | Freq: Three times a day (TID) | ORAL | Status: DC
  Administered 2024-05-07 – 2024-05-08 (×2): 50 mg via ORAL
  Filled 2024-05-07 (×4): qty 1

## 2024-05-07 NOTE — Progress Notes (Signed)
 Patient refused to have dressings changed to BLE. Multiple attempts made.

## 2024-05-07 NOTE — Progress Notes (Signed)
 PROGRESS NOTE    [name]  FMW:[id number] DOB: [birth year] DOA: 05/05/2024 PCP: [name] SQUIBB, PA    Brief Narrative:  43 y.o. 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   Patient was recently hospitalized from 6/6 - 6/9 due to MRSA bacteremia. That admission was recommended by Dr. Epifanio of ID for completing his inpatient treatment for MRSA bacteremia. Pt was treated with IV vancomycin  in hospitqal, however, he left AMA again on 6/9 without warning.  2D echo was negative for vegetation. Pt states that he has been prescribed linezolid by infectious disease and states he has been compliant with this medication. Patient reports that ulcers to his lower extremities have been in healing process.   Patient reports that his last use of IV fentanyl was earlier this morning, and he has since developed withdrawal symptoms including malaise, hiccups, nausea, vomiting, diaphoresis, shaking, pain all over.  No diarrhea, cough, SOB.  No symptoms of UTI.  No fever or chills.  He states that he cannot keep anything down, including medications due to severe nausea and vomiting.  Assessment & Plan:   Principal Problem:   Opioid withdrawal (HCC) Active Problems:   MRSA bacteremia   Opioid use disorder   Polysubstance abuse (HCC)   IVDU (intravenous drug user)   Hypokalemia   Infected ulcer of skin (HCC)   Overweight (BMI 25.0-29.9)   Hypophosphatemia   Opioid withdrawal (HCC):  pt has malaise, hiccups, nausea, vomiting, diaphoresis, shaking, pain all over, elevated blood pressure, 179/78, clinically consistent with opioid withdrawal.  Patient admits to IV fentanyl use.  States uses 3 g IV per day. Had a lengthy conversation with the  patient explaining that he is in severe opioid withdrawal and while it may be very difficult and painful it is inherently not hazardous to his life.  He expresses desire to discontinue use of IV fentanyl. Plan: COWS protocol Continue Suboxone  Continue clonidine  0.2 mg twice daily Continue aggressive IV fluids As needed Ativan  Scheduled Reglan  Scheduled Robaxin Scheduled Thorazine, dose increased to 50mg  TID  Opioid use disorder, polysubstance abuse, IVDU (intravenous drug user):  UDS positive for cocaine. Plan: Counsel patient TOC consult for [facility] resources   MRSA bacteremia:  WBC 13.2, no fever. Plan: Continue IV vancomycin  for now Switch back to linezolid when able to tolerate p.o. Follow repeat blood cultures, NGTD  Chronic skin ulcer in both legs: WOC consult   Hypokalemia:  Patient has been refusing labs   Overweight (BMI 25.0-29.9):  Body weight 93.4 kg, BMI 27.17 Complicates care     DVT prophylaxis: SQ Lovenox  Code Status: Full Family Communication: None, call offered, but declined Disposition Plan: Status is: Inpatient Remains inpatient appropriate because: Severe opiate withdrawal   Level of care: Med-Surg  Consultants:  None  Procedures:  None  Antimicrobials: IV vancomycin     Subjective: Seen and examined.  Very sleepy this AM.  Difficult to arouse  Objective: Vitals:   05/07/24 0534 05/07/24 0545 05/07/24 0741 05/07/24 0800  BP: (!) 145/72 138/69 (!) 145/70   Pulse: (!) 143 87 (!) 139 98  Resp:  18 18   Temp: 98.7 F (37.1 C) 98.7 F (37.1 C) 98 F (36.7 C)   TempSrc:      SpO2: 100% 96% 100%   Weight:  Height:        Intake/Output Summary (Last 24 hours) at 05/07/2024 1209 Last data filed at 05/07/2024 0313 Gross per 24 hour  Intake 2236.7 ml  Output --  Net 2236.7 ml   Filed Weights   05/05/24 1516 05/06/24 0037  Weight: 93.4 kg 94.5 kg    Examination:  General exam: Lethargic. Respiratory system: Clear lungs.   Room air clear Cardiovascular system: S1-S2, tachycardic, regular rhythm, no murmurs Gastrointestinal system: Soft, NT/ND, hyperactive bowel sounds Central nervous system: Alert and oriented. No focal neurological deficits. Extremities: Symmetric 5 x 5 power. Skin: No rashes, lesions or ulcers Psychiatry: Judgement and insight appear impaired. Mood & affect anxious.     Data Reviewed: I have personally reviewed following labs and imaging studies  CBC: Recent Labs  Lab 05/05/24 1548  WBC 13.2*  NEUTROABS 11.8*  HGB 13.7  HCT 43.2  MCV 87.8  PLT 327   Basic Metabolic Panel: Recent Labs  Lab 05/05/24 1539 05/05/24 1548  NA  --  138  K  --  3.3*  CL  --  100  CO2  --  24  GLUCOSE  --  124*  BUN  --  9  CREATININE  --  1.05  CALCIUM   --  9.7  MG 1.9  --   PHOS  --  <1.0*   GFR: Estimated Creatinine Clearance: 109.4 mL/min (by C-G formula based on SCr of 1.05 mg/dL). Liver Function Tests: Recent Labs  Lab 05/05/24 1548  AST 30  ALT 14  ALKPHOS 89  BILITOT 1.2  PROT 9.4*  ALBUMIN 4.5   No results for input(s): LIPASE, AMYLASE in the last 168 hours. No results for input(s): AMMONIA in the last 168 hours. Coagulation Profile: Recent Labs  Lab 05/05/24 2103  INR 1.1   Cardiac Enzymes: No results for input(s): CKTOTAL, CKMB, CKMBINDEX, TROPONINI in the last 168 hours. BNP (last 3 results) No results for input(s): PROBNP in the last 8760 hours. HbA1C: No results for input(s): HGBA1C in the last 72 hours. CBG: Recent Labs  Lab 05/06/24 0449  GLUCAP 150*   Lipid Profile: No results for input(s): CHOL, HDL, LDLCALC, TRIG, CHOLHDL, LDLDIRECT in the last 72 hours. Thyroid  Function Tests: No results for input(s): TSH, T4TOTAL, FREET4, T3FREE, THYROIDAB in the last 72 hours. Anemia Panel: No results for input(s): VITAMINB12, FOLATE, FERRITIN, TIBC, IRON, RETICCTPCT in the last 72 hours. Sepsis Labs: No  results for input(s): PROCALCITON, LATICACIDVEN in the last 168 hours.  Recent Results (from the past 240 hours)  Culture, blood (routine x 2)     Status: None (Preliminary result)   Collection Time: 05/05/24  3:48 PM   Specimen: BLOOD  Result Value Ref Range Status   Specimen Description BLOOD LEFT ANTECUBITAL  Final   Special Requests   Final    BOTTLES DRAWN AEROBIC AND ANAEROBIC Blood Culture results may not be optimal due to an inadequate volume of blood received in culture bottles   Culture   Final    NO GROWTH 2 DAYS Performed at [facility], [address], [place], [place] [address]    Report Status PENDING  Incomplete  Culture, blood (routine x 2)     Status: None (Preliminary result)   Collection Time: 05/05/24  9:03 PM   Specimen: BLOOD  Result Value Ref Range Status   Specimen Description BLOOD BLOOD LEFT HAND  Final   Special Requests   Final    BOTTLES DRAWN AEROBIC AND ANAEROBIC  Blood Culture results may not be optimal due to an inadequate volume of blood received in culture bottles   Culture   Final    NO GROWTH 2 DAYS Performed at [facility], [address] [place], [place] [address]    Report Status PENDING  Incomplete         Radiology Studies: No results found.      Scheduled Meds:  buprenorphine -naloxone   1 tablet Sublingual Daily   chlorproMAZINE  50 mg Oral TID   cloNIDine   0.2 mg Oral BID   enoxaparin  (LOVENOX ) injection  40 mg Subcutaneous Q24H   methocarbamol  750 mg Oral QID   metoCLOPramide  (REGLAN ) injection  10 mg Intravenous Q8H   Continuous Infusions:  vancomycin  1,500 mg (05/07/24 0948)     LOS: 2 days     Calvin KATHEE Robson, MD [facility]   If 7PM-7AM, please contact night-coverage  05/07/2024, 12:09 PM

## 2024-05-07 NOTE — Progress Notes (Signed)
 Attempted to administer scheduled subxone. Patient sleeping soundly. Awakens to speech but falls back asleep quickly. Will hold for now.

## 2024-05-07 NOTE — Plan of Care (Signed)

## 2024-05-08 DIAGNOSIS — F1193 Opioid use, unspecified with withdrawal: Secondary | ICD-10-CM | POA: Diagnosis not present

## 2024-05-08 MED ORDER — ACETAMINOPHEN 325 MG PO TABS
650.0000 mg | ORAL_TABLET | Freq: Once | ORAL | Status: AC
  Administered 2024-05-08: 650 mg via ORAL
  Filled 2024-05-08: qty 2

## 2024-05-08 MED ORDER — SODIUM CHLORIDE 0.9 % IV BOLUS
250.0000 mL | Freq: Once | INTRAVENOUS | Status: AC
  Administered 2024-05-08: 250 mL via INTRAVENOUS

## 2024-05-08 MED ORDER — LINEZOLID 600 MG PO TABS
600.0000 mg | ORAL_TABLET | Freq: Two times a day (BID) | ORAL | Status: DC
  Filled 2024-05-08: qty 1

## 2024-05-08 MED ORDER — SODIUM CHLORIDE 0.9 % IV SOLN
8.0000 mg/kg | INTRAVENOUS | Status: DC
  Administered 2024-05-08: 800 mg via INTRAVENOUS
  Filled 2024-05-08 (×2): qty 16

## 2024-05-08 MED ORDER — CHLORPROMAZINE HCL 50 MG PO TABS: 50.0000 mg | ORAL_TABLET | Freq: Three times a day (TID) | ORAL | Status: DC | PRN

## 2024-05-08 MED ORDER — CLONIDINE HCL 0.1 MG PO TABS
0.1000 mg | ORAL_TABLET | Freq: Two times a day (BID) | ORAL | Status: DC
  Administered 2024-05-08: 0.1 mg via ORAL
  Filled 2024-05-08: qty 1

## 2024-05-08 MED ORDER — METOPROLOL TARTRATE 5 MG/5ML IV SOLN
2.5000 mg | Freq: Once | INTRAVENOUS | Status: AC
  Administered 2024-05-08: 2.5 mg via INTRAVENOUS
  Filled 2024-05-08: qty 5

## 2024-05-08 MED ORDER — CIPROFLOXACIN HCL 500 MG PO TABS
500.0000 mg | ORAL_TABLET | Freq: Two times a day (BID) | ORAL | Status: DC
  Administered 2024-05-08 – 2024-05-09 (×3): 500 mg via ORAL
  Filled 2024-05-08 (×4): qty 1

## 2024-05-08 NOTE — Progress Notes (Signed)
 PROGRESS NOTE    [name]  FMW:[id number] DOB: 1981/05/02 DOA: 05/05/2024 PCP: [name] SQUIBB, PA    Brief Narrative:  43 y.o. 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   Patient was recently hospitalized from 6/6 - 6/9 due to MRSA bacteremia. That admission was recommended by Dr. Epifanio of ID for completing his inpatient treatment for MRSA bacteremia. Pt was treated with IV vancomycin  in hospitqal, however, he left AMA again on 6/9 without warning.  2D echo was negative for vegetation. Pt states that he has been prescribed linezolid by infectious disease and states he has been compliant with this medication. Patient reports that ulcers to his lower extremities have been in healing process.   Patient reports that his last use of IV fentanyl was earlier this morning, and he has since developed withdrawal symptoms including malaise, hiccups, nausea, vomiting, diaphoresis, shaking, pain all over.  No diarrhea, cough, SOB.  No symptoms of UTI.  No fever or chills.  He states that he cannot keep anything down, including medications due to severe nausea and vomiting.  Assessment & Plan:   Principal Problem:   Opioid withdrawal (HCC) Active Problems:   MRSA bacteremia   Opioid use disorder   Polysubstance abuse (HCC)   IVDU (intravenous drug user)   Hypokalemia   Infected ulcer of skin (HCC)   Overweight (BMI 25.0-29.9)   Hypophosphatemia   Opioid withdrawal (HCC):  pt has malaise, hiccups, nausea, vomiting, diaphoresis, shaking, pain all over, elevated blood pressure, 179/78, clinically consistent with opioid withdrawal.  Patient admits to IV fentanyl use.  States uses 3 g IV per day. Had a lengthy conversation with the  patient explaining that he is in severe opioid withdrawal and while it may be very difficult and painful it is inherently not hazardous to his life.  He expresses desire to discontinue use of IV fentanyl. Plan: COWS protocol Continue Suboxone  Decrease clonidine  to 0.1 mg twice daily DC IVF As needed Ativan  Discontinue scheduled Reglan  Scheduled Robaxin DC scheduled Thorazine.  Continue as as needed  Opioid use disorder, polysubstance abuse, IVDU (intravenous drug user):  UDS positive for cocaine. Plan: Counsel patient TOC consult for [facility] resources   MRSA bacteremia:  WBC 13.2, no fever. Plan: Nausea improved.  Discontinue IV vancomycin .  Restart p.o. Zyvox.  Follow repeat blood cultures, no growth to date.  Chronic skin ulcer in both legs: WOC consult   Hypokalemia:  Patient has been refusing labs   Overweight (BMI 25.0-29.9):  Body weight 93.4 kg, BMI 27.17 Complicates care     DVT prophylaxis: SQ Lovenox  Code Status: Full Family Communication: None, call offered, but declined Disposition Plan: Status is: Inpatient Remains inpatient appropriate because: Severe opiate withdrawal   Level of care: Med-Surg  Consultants:  None  Procedures:  None  Antimicrobials: IV vancomycin     Subjective: Seen and examined.  Much better appearing this a.m.  Less shaky.  Sitting up eating.  Objective: Vitals:   05/08/24 0011 05/08/24 0421 05/08/24 0950 05/08/24 1029  BP: (!) 146/86 (!) 138/92 (!) 144/90 (!) 144/90  Pulse: (!) 135 (!) 108 (!) 108   Resp:   14   Temp: 99.1 F (37.3 C) 98.2 F (36.8 C) (!) 97.4 F (36.3 C)   TempSrc:  Oral  SpO2: 97% 98% 100%   Weight:      Height:        Intake/Output Summary (Last 24 hours) at 05/08/2024 1211 Last data filed at 05/08/2024 0400 Gross per 24 hour  Intake 500 ml  Output 2225 ml  Net -1725 ml   Filed Weights   05/05/24 1516 05/06/24 0037  Weight: 93.4 kg 94.5 kg    Examination:  General exam: Appears  fatigued otherwise improved. Respiratory system: Clear lungs.  Room air clear Cardiovascular system: 1 S2, RRR, no murmurs, no pedal edema Gastrointestinal system: Soft, NT/ND, hyperactive bowel sounds Central nervous system: Alert and oriented. No focal neurological deficits. Extremities: Symmetric 5 x 5 power. Skin: Chronic appearing wounds bilateral lower extremities Psychiatry: Judgement and insight appear impaired. Mood & affect anxious.     Data Reviewed: I have personally reviewed following labs and imaging studies  CBC: Recent Labs  Lab 05/05/24 1548  WBC 13.2*  NEUTROABS 11.8*  HGB 13.7  HCT 43.2  MCV 87.8  PLT 327   Basic Metabolic Panel: Recent Labs  Lab 05/05/24 1539 05/05/24 1548  NA  --  138  K  --  3.3*  CL  --  100  CO2  --  24  GLUCOSE  --  124*  BUN  --  9  CREATININE  --  1.05  CALCIUM   --  9.7  MG 1.9  --   PHOS  --  <1.0*   GFR: Estimated Creatinine Clearance: 109.4 mL/min (by C-G formula based on SCr of 1.05 mg/dL). Liver Function Tests: Recent Labs  Lab 05/05/24 1548  AST 30  ALT 14  ALKPHOS 89  BILITOT 1.2  PROT 9.4*  ALBUMIN 4.5   No results for input(s): LIPASE, AMYLASE in the last 168 hours. No results for input(s): AMMONIA in the last 168 hours. Coagulation Profile: Recent Labs  Lab 05/05/24 2103  INR 1.1   Cardiac Enzymes: No results for input(s): CKTOTAL, CKMB, CKMBINDEX, TROPONINI in the last 168 hours. BNP (last 3 results) No results for input(s): PROBNP in the last 8760 hours. HbA1C: No results for input(s): HGBA1C in the last 72 hours. CBG: Recent Labs  Lab 05/06/24 0449  GLUCAP 150*   Lipid Profile: No results for input(s): CHOL, HDL, LDLCALC, TRIG, CHOLHDL, LDLDIRECT in the last 72 hours. Thyroid  Function Tests: No results for input(s): TSH, T4TOTAL, FREET4, T3FREE, THYROIDAB in the last 72 hours. Anemia Panel: No results for input(s): VITAMINB12, FOLATE,  FERRITIN, TIBC, IRON, RETICCTPCT in the last 72 hours. Sepsis Labs: No results for input(s): PROCALCITON, LATICACIDVEN in the last 168 hours.  Recent Results (from the past 240 hours)  Culture, blood (routine x 2)     Status: None (Preliminary result)   Collection Time: 05/05/24  3:48 PM   Specimen: BLOOD  Result Value Ref Range Status   Specimen Description BLOOD LEFT ANTECUBITAL  Final   Special Requests   Final    BOTTLES DRAWN AEROBIC AND ANAEROBIC Blood Culture results may not be optimal due to an inadequate volume of blood received in culture bottles   Culture   Final    NO GROWTH 3 DAYS Performed at [facility], [address] [place], [place] [address]    Report Status PENDING  Incomplete  Culture, blood (routine x 2)     Status: None (Preliminary result)   Collection Time: 05/05/24  9:03 PM   Specimen: BLOOD  Result Value Ref Range Status   Specimen Description BLOOD BLOOD  LEFT HAND  Final   Special Requests   Final    BOTTLES DRAWN AEROBIC AND ANAEROBIC Blood Culture results may not be optimal due to an inadequate volume of blood received in culture bottles   Culture   Final    NO GROWTH 3 DAYS Performed at [facility], [address], [place], [place] [address]    Report Status PENDING  Incomplete         Radiology Studies: No results found.      Scheduled Meds:  buprenorphine -naloxone   1 tablet Sublingual Daily   chlorproMAZINE  50 mg Oral TID   cloNIDine   0.1 mg Oral BID   enoxaparin  (LOVENOX ) injection  40 mg Subcutaneous Q24H   methocarbamol  750 mg Oral QID   Continuous Infusions:  vancomycin  1,500 mg (05/08/24 1041)     LOS: 3 days     Calvin KATHEE Robson, MD [facility]   If 7PM-7AM, please contact night-coverage  05/08/2024, 12:11 PM

## 2024-05-08 NOTE — Progress Notes (Signed)
 Spoke with Mitchell from [employer]. Patient unable to receive services there due to his insurance. [facility] Medicaid/[employer] verified. Medicare coverage makes him ineligible.  Will continue search.

## 2024-05-08 NOTE — Care Management Important Message (Signed)
 Important Message  Patient Details  Name: [name] MRN: [id number] Date of Birth: Jun 11, 1981   Important Message Given:  Yes - Medicare IM     Danyela Posas W, CMA 05/08/2024, 11:01 AM

## 2024-05-08 NOTE — Progress Notes (Signed)
 Introduced patient to role of Statistician. Intake questions completed. Patient currently living with his mother. Denies any SDOH needs at present time. Does have a PCP. Will establish follow up appointment. Patient has a long history, roughly 19 years, of substance abuse, mostly with fentanyl. Patients states he uses anywhere from 2-3 grams of fentanyl daily by injection.  Patient states he would like to get help and would be interested in treatment. No preference where.  Will assist with this. Encouraged to call with questions or concerns.

## 2024-05-09 DIAGNOSIS — F1193 Opioid use, unspecified with withdrawal: Secondary | ICD-10-CM | POA: Diagnosis not present

## 2024-05-09 LAB — BASIC METABOLIC PANEL WITH GFR
Anion gap: 9 (ref 5–15)
BUN: 6 mg/dL (ref 6–20)
CO2: 27 mmol/L (ref 22–32)
Calcium: 8.6 mg/dL — ABNORMAL LOW (ref 8.9–10.3)
Chloride: 103 mmol/L (ref 98–111)
Creatinine, Ser: 0.79 mg/dL (ref 0.61–1.24)
GFR, Estimated: >60 mL/min (ref >60)
Glucose, Bld: 129 mg/dL — ABNORMAL HIGH (ref 70–99)
Potassium: 3.1 mmol/L — ABNORMAL LOW (ref 3.5–5.1)
Sodium: 139 mmol/L (ref 135–145)

## 2024-05-09 LAB — PHOSPHORUS: Phosphorus: 2.7 mg/dL (ref 2.5–4.6)

## 2024-05-09 LAB — CBC WITH DIFFERENTIAL/PLATELET
Abs Immature Granulocytes: 0.03 10*3/uL (ref 0.00–0.07)
Basophils Absolute: 0 10*3/uL (ref 0.0–0.1)
Basophils Relative: 0 %
Eosinophils Absolute: 0.1 10*3/uL (ref 0.0–0.5)
Eosinophils Relative: 1 %
HCT: 43.1 % (ref 39.0–52.0)
Hemoglobin: 13.4 g/dL (ref 13.0–17.0)
Immature Granulocytes: 0 %
Lymphocytes Relative: 15 %
Lymphs Abs: 1.5 10*3/uL (ref 0.7–4.0)
MCH: 27.6 pg (ref 26.0–34.0)
MCHC: 31.1 g/dL (ref 30.0–36.0)
MCV: 88.9 fL (ref 80.0–100.0)
Monocytes Absolute: 0.8 10*3/uL (ref 0.1–1.0)
Monocytes Relative: 7 %
Neutro Abs: 7.8 10*3/uL — ABNORMAL HIGH (ref 1.7–7.7)
Neutrophils Relative %: 77 %
Platelets: 294 10*3/uL (ref 150–400)
RBC: 4.85 MIL/uL (ref 4.22–5.81)
RDW: 15.2 % (ref 11.5–15.5)
WBC: 10.2 10*3/uL (ref 4.0–10.5)
nRBC: 0 % (ref 0.0–0.2)

## 2024-05-09 LAB — CK: Total CK: 21 U/L — ABNORMAL LOW (ref 49–397)

## 2024-05-09 LAB — MAGNESIUM: Magnesium: 1.9 mg/dL (ref 1.7–2.4)

## 2024-05-09 MED ORDER — METHOCARBAMOL 500 MG PO TABS: 500.0000 mg | ORAL_TABLET | Freq: Three times a day (TID) | ORAL | Status: DC

## 2024-05-09 MED ORDER — METHOCARBAMOL 500 MG PO TABS: 500.0000 mg | ORAL_TABLET | Freq: Three times a day (TID) | ORAL | 0 refills | Status: AC | PRN

## 2024-05-09 MED ORDER — LORAZEPAM 1 MG PO TABS: 1.0000 mg | ORAL_TABLET | Freq: Two times a day (BID) | ORAL | Status: DC | PRN

## 2024-05-09 MED ORDER — ONDANSETRON HCL 4 MG PO TABS: 4.0000 mg | ORAL_TABLET | Freq: Every day | ORAL | 0 refills | Status: AC | PRN

## 2024-05-09 MED ORDER — CIPROFLOXACIN HCL 250 MG PO TABS: 250.0000 mg | ORAL_TABLET | Freq: Two times a day (BID) | ORAL | 0 refills | Status: AC

## 2024-05-09 MED ORDER — ONDANSETRON HCL 4 MG PO TABS: 4.0000 mg | ORAL_TABLET | Freq: Every day | ORAL | 0 refills | Status: DC | PRN

## 2024-05-09 MED ORDER — METHOCARBAMOL 500 MG PO TABS: 500.0000 mg | ORAL_TABLET | Freq: Three times a day (TID) | ORAL | 0 refills | Status: DC | PRN

## 2024-05-09 MED ORDER — LINEZOLID 600 MG PO TABS: 600.0000 mg | ORAL_TABLET | Freq: Two times a day (BID) | ORAL | 0 refills | Status: DC

## 2024-05-09 MED ORDER — BUPRENORPHINE HCL-NALOXONE HCL 8-2 MG SL SUBL: 1.0000 | SUBLINGUAL_TABLET | Freq: Every day | SUBLINGUAL | 0 refills | Status: DC

## 2024-05-09 MED ORDER — LINEZOLID 600 MG PO TABS: 600.0000 mg | ORAL_TABLET | Freq: Two times a day (BID) | ORAL | 0 refills | Status: AC

## 2024-05-09 MED ORDER — BUPRENORPHINE HCL-NALOXONE HCL 8-2 MG SL SUBL
1.0000 | SUBLINGUAL_TABLET | Freq: Every day | SUBLINGUAL | 0 refills | Status: AC
Start: 2024-05-10 — End: 2024-05-12

## 2024-05-09 MED ORDER — CIPROFLOXACIN HCL 250 MG PO TABS
250.0000 mg | ORAL_TABLET | Freq: Two times a day (BID) | ORAL | 0 refills | Status: DC
Start: 2024-05-09 — End: 2024-05-09

## 2024-05-09 NOTE — Discharge Summary (Signed)
 Physician Discharge Summary  [name] FMW:[id number] DOB: 1981/05/15 DOA: 05/05/2024  PCP: [name] SQUIBB, PA  Admit date: 05/05/2024 Discharge date: 05/09/2024  Admitted From: Home Disposition:  Home  Recommendations for Outpatient Follow-up:  Follow up with PCP in 1-2 weeks Follow up with the suboxone  clinic 7/2  Home Health:No  Equipment/Devices:None   Discharge Condition:Stable  CODE STATUS:FULL  Diet recommendation: Reg  Brief/Interim Summary:  43 y.o. 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   Patient was recently hospitalized from 6/6 - 6/9 due to MRSA bacteremia. That admission was recommended by Dr. Epifanio of ID for completing his inpatient treatment for MRSA bacteremia. Pt was treated with IV vancomycin  in hospitqal, however, he left AMA again on 6/9 without warning.  2D echo was negative for vegetation. Pt states that he has been prescribed linezolid by infectious disease and states he has been compliant with this medication. Patient reports that ulcers to his lower extremities have been in healing process.   Patient reports that his last use of IV fentanyl was earlier this morning, and he has since developed withdrawal symptoms including malaise, hiccups, nausea, vomiting, diaphoresis, shaking, pain all over.  No diarrhea, cough, SOB.  No symptoms of UTI.  No fever or chills.  He states that he cannot keep anything down, including medications due to severe nausea and vomiting.   Discharge Diagnoses:  Principal Problem:   Opioid withdrawal (HCC) Active Problems:   MRSA bacteremia   Opioid use disorder   Polysubstance abuse (HCC)   IVDU (intravenous drug user)   Hypokalemia   Infected ulcer of skin  (HCC)   Overweight (BMI 25.0-29.9)   Hypophosphatemia   Opioid withdrawal (HCC):  pt has malaise, hiccups, nausea, vomiting, diaphoresis, shaking, pain all over, elevated blood pressure, 179/78, clinically consistent with opioid withdrawal.  Patient admits to IV fentanyl use.  States uses 3 g IV per day. Had a lengthy conversation with the patient explaining that he is in severe opioid withdrawal and while it may be very difficult and painful it is inherently not hazardous to his life.  He expresses desire to discontinue use of IV fentanyl. 7/1: Withdrawal symptoms have much improved Plan: 2 days of Suboxone  prescribed on discharge.  Patient states he will present to the Suboxone  clinic on 7/2.  Discontinue Ativan .  Small dose p.o. Robaxin prescribed.  P.o. Zofran  prescribed.  Counseled on substance use cessation.  MRSA bacteremia:  WBC 13.2, no fever. Plan: Patient was on IV daptomycin per infectious disease recommendations.  At time of discharge discussed with Dr. Epifanio.  Recommend resumption of linezolid and ciprofloxacin as previously prescribed.  Follow-up in infectious disease office.   Discharge Instructions  Discharge Instructions     Diet - low sodium heart healthy   Complete by: As directed    Increase activity slowly   Complete by: As directed    No wound care   Complete by: As directed       Allergies as of 05/09/2024   No Known Allergies      Medication List     STOP taking these medications    albendazole  200 MG tablet Commonly known as: ALBENZA    cephALEXin  500 MG capsule Commonly known as: KEFLEX    doxycycline  100 MG tablet Commonly known as: ADOXA  venlafaxine  XR 75 MG 24 hr capsule Commonly known as: Effexor  XR       TAKE these medications    buprenorphine -naloxone  8-2 mg Subl SL tablet Commonly known as: SUBOXONE  Place 1 tablet under the tongue daily for 2 days. Start taking on: May 10, 2024   ciprofloxacin 250 MG tablet Commonly  known as: CIPRO Take 1 tablet (250 mg total) by mouth 2 (two) times daily for 10 days.   linezolid 600 MG tablet Commonly known as: ZYVOX Take 1 tablet (600 mg total) by mouth 2 (two) times daily for 10 days. X 14 days   methocarbamol 500 MG tablet Commonly known as: ROBAXIN Take 1 tablet (500 mg total) by mouth every 8 (eight) hours as needed for muscle spasms.   ondansetron  4 MG tablet Commonly known as: Zofran  Take 1 tablet (4 mg total) by mouth daily as needed for nausea or vomiting.        Follow-up Information     [name] SQUIBB, PA Follow up.   Specialty: Physician Assistant Why: Hospital follow up Contact information: [address] [place] [address] [place] [address] [phone]                No Known Allergies  Consultations: None   Procedures/Studies: ECHOCARDIOGRAM COMPLETE Result Date: 04/16/2024    ECHOCARDIOGRAM REPORT   Patient Name:   [name] Date of Exam: 04/16/2024 Medical Rec #:  [id number]    Height:       73.0 in Accession #:    [id number]   Weight:       206.0 lb Date of Birth:  1981-02-13   BSA:          2.179 m Patient Age:    42 years     BP:           112/69 mmHg Patient Gender: M            HR:           60 bpm. Exam Location:  [facility] Procedure: 2D Echo, Cardiac Doppler and Color Doppler (Both Spectral and Color            Flow Doppler were utilized during procedure). Indications:     Murmur R01.1  History:         Patient has no prior history of Echocardiogram examinations.                  Signs/Symptoms:Murmur.  Sonographer:     Bari Roar Referring Phys:  [id number] MARIEN LITTIE PIETY Diagnosing Phys: Keller Alluri IMPRESSIONS  1. Left ventricular ejection fraction, by estimation, is 65 to 70%. The left ventricle has normal function. The left ventricle has no regional wall motion abnormalities. There is mild left ventricular hypertrophy. Left ventricular diastolic parameters were normal.  2. Right ventricular systolic function is normal. The  right ventricular size is normal.  3. The mitral valve is normal in structure. Mild mitral valve regurgitation.  4. The aortic valve is tricuspid. Aortic valve regurgitation is not visualized.  5. The inferior vena cava is normal in size with greater than 50% respiratory variability, suggesting right atrial pressure of 3 mmHg. FINDINGS  Left Ventricle: Left ventricular ejection fraction, by estimation, is 65 to 70%. The left ventricle has normal function. The left ventricle has no regional wall motion abnormalities. The left ventricular internal cavity size was normal in size. There is  mild left ventricular hypertrophy. Left ventricular diastolic parameters were normal. Right Ventricle: The right  ventricular size is normal. No increase in right ventricular wall thickness. Right ventricular systolic function is normal. Left Atrium: Left atrial size was normal in size. Right Atrium: Right atrial size was normal in size. Pericardium: There is no evidence of pericardial effusion. Mitral Valve: The mitral valve is normal in structure. Mild mitral valve regurgitation. MV peak gradient, 4.6 mmHg. The mean mitral valve gradient is 2.0 mmHg. Tricuspid Valve: The tricuspid valve is normal in structure. Tricuspid valve regurgitation is mild. Aortic Valve: The aortic valve is tricuspid. Aortic valve regurgitation is not visualized. Aortic valve mean gradient measures 8.5 mmHg. Aortic valve peak gradient measures 17.1 mmHg. Aortic valve area, by VTI measures 2.52 cm. Pulmonic Valve: The pulmonic valve was not well visualized. Pulmonic valve regurgitation is trivial. Aorta: The aortic root and ascending aorta are structurally normal, with no evidence of dilitation. Venous: The inferior vena cava is normal in size with greater than 50% respiratory variability, suggesting right atrial pressure of 3 mmHg. IAS/Shunts: The atrial septum is grossly normal.  LEFT VENTRICLE PLAX 2D LVIDd:         5.00 cm   Diastology LVIDs:          3.40 cm   LV e' medial:    10.90 cm/s LV PW:         1.20 cm   LV E/e' medial:  8.6 LV IVS:        1.30 cm   LV e' lateral:   14.40 cm/s LVOT diam:     1.90 cm   LV E/e' lateral: 6.5 LV SV:         100 LV SV Index:   46 LVOT Area:     2.84 cm  RIGHT VENTRICLE RV Basal diam:  3.90 cm RV Mid diam:    3.10 cm RV S prime:     15.70 cm/s TAPSE (M-mode): 3.3 cm LEFT ATRIUM             Index        RIGHT ATRIUM           Index LA diam:        4.00 cm 1.84 cm/m   RA Area:     20.10 cm LA Vol (A2C):   78.7 ml 36.12 ml/m  RA Volume:   56.30 ml  25.84 ml/m LA Vol (A4C):   57.6 ml 26.44 ml/m LA Biplane Vol: 68.1 ml 31.26 ml/m  AORTIC VALVE                     PULMONIC VALVE AV Area (Vmax):    2.33 cm      PV Vmax:          1.15 m/s AV Area (Vmean):   2.31 cm      PV Peak grad:     5.3 mmHg AV Area (VTI):     2.52 cm      PR End Diast Vel: 5.66 msec AV Vmax:           206.50 cm/s   RVOT Peak grad:   3 mmHg AV Vmean:          133.500 cm/s AV VTI:            0.396 m AV Peak Grad:      17.1 mmHg AV Mean Grad:      8.5 mmHg LVOT Vmax:         170.00 cm/s LVOT Vmean:  109.000 cm/s LVOT VTI:          0.352 m LVOT/AV VTI ratio: 0.89  AORTA Ao Root diam: 2.80 cm Ao Asc diam:  3.00 cm MITRAL VALVE               TRICUSPID VALVE MV Area (PHT): 4.17 cm    TR Peak grad:   29.4 mmHg MV Area VTI:   2.82 cm    TR Vmax:        271.00 cm/s MV Peak grad:  4.6 mmHg MV Mean grad:  2.0 mmHg    SHUNTS MV Vmax:       1.07 m/s    Systemic VTI:  0.35 m MV Vmean:      63.6 cm/s   Systemic Diam: 1.90 cm MV Decel Time: 182 msec MV E velocity: 93.80 cm/s MV A velocity: 72.30 cm/s MV E/A ratio:  1.30 MV A Prime:    11.9 cm/s Keller Paterson Electronically signed by Keller Paterson Signature Date/Time: 04/16/2024/4:37:35 PM    Final    DG Chest Port 1 View Result Date: 04/14/2024 CLINICAL DATA:  Sepsis EXAM: PORTABLE CHEST 1 VIEW COMPARISON:  Chest radiograph dated 02/06/2019 FINDINGS: Mildly low lung volumes. Right basilar patchy opacities. No  pleural effusion or pneumothorax. Enlarged cardiomediastinal silhouette. No acute osseous abnormality. Left upper quadrant surgical clips. IMPRESSION: 1. Right basilar patchy opacities, which may represent atelectasis or pneumonia. 2. Cardiomegaly. Electronically Signed   By: Limin  Xu M.D.   On: 04/14/2024 17:58      Subjective: Seen and examined on the day of discharge.  Stable no distress.  Appropriate discharge home.  Discharge Exam: Vitals:   05/09/24 0623 05/09/24 1022  BP: 121/88 (!) 156/111  Pulse: 97 75  Resp:  20  Temp: 98.5 F (36.9 C) 98.8 F (37.1 C)  SpO2: 97% 100%   Vitals:   05/08/24 2223 05/09/24 0223 05/09/24 0623 05/09/24 1022  BP: 114/78 (!) 142/93 121/88 (!) 156/111  Pulse: (!) 105 85 97 75  Resp: 16 17  20   Temp:  98.9 F (37.2 C) 98.5 F (36.9 C) 98.8 F (37.1 C)  TempSrc: Oral Oral Oral Oral  SpO2: 98% 100% 97% 100%  Weight:      Height:        General: Pt is alert, awake, not in acute distress Cardiovascular: RRR, S1/S2 +, no rubs, no gallops Respiratory: CTA bilaterally, no wheezing, no rhonchi Abdominal: Soft, NT, ND, bowel sounds + Extremities: no edema, no cyanosis    The results of significant diagnostics from this hospitalization (including imaging, microbiology, ancillary and laboratory) are listed below for reference.     Microbiology: Recent Results (from the past 240 hours)  Culture, blood (routine x 2)     Status: None (Preliminary result)   Collection Time: 05/05/24  3:48 PM   Specimen: BLOOD  Result Value Ref Range Status   Specimen Description BLOOD LEFT ANTECUBITAL  Final   Special Requests   Final    BOTTLES DRAWN AEROBIC AND ANAEROBIC Blood Culture results may not be optimal due to an inadequate volume of blood received in culture bottles   Culture   Final    NO GROWTH 4 DAYS Performed at [facility], [address] [place], [place] [address]    Report Status PENDING  Incomplete  Culture, blood (routine x  2)     Status: None (Preliminary result)   Collection Time: 05/05/24  9:03 PM   Specimen: BLOOD  Result  Value Ref Range Status   Specimen Description BLOOD BLOOD LEFT HAND  Final   Special Requests   Final    BOTTLES DRAWN AEROBIC AND ANAEROBIC Blood Culture results may not be optimal due to an inadequate volume of blood received in culture bottles   Culture   Final    NO GROWTH 4 DAYS Performed at [facility], [address], [place], [place] [address]    Report Status PENDING  Incomplete     Labs: BNP (last 3 results) No results for input(s): BNP in the last 8760 hours. Basic Metabolic Panel: Recent Labs  Lab 05/05/24 1539 05/05/24 1548  NA  --  138  K  --  3.3*  CL  --  100  CO2  --  24  GLUCOSE  --  124*  BUN  --  9  CREATININE  --  1.05  CALCIUM   --  9.7  MG 1.9  --   PHOS  --  <1.0*   Liver Function Tests: Recent Labs  Lab 05/05/24 1548  AST 30  ALT 14  ALKPHOS 89  BILITOT 1.2  PROT 9.4*  ALBUMIN 4.5   No results for input(s): LIPASE, AMYLASE in the last 168 hours. No results for input(s): AMMONIA in the last 168 hours. CBC: Recent Labs  Lab 05/05/24 1548  WBC 13.2*  NEUTROABS 11.8*  HGB 13.7  HCT 43.2  MCV 87.8  PLT 327   Cardiac Enzymes: No results for input(s): CKTOTAL, CKMB, CKMBINDEX, TROPONINI in the last 168 hours. BNP: Invalid input(s): POCBNP CBG: Recent Labs  Lab 05/06/24 0449  GLUCAP 150*   D-Dimer No results for input(s): DDIMER in the last 72 hours. Hgb A1c No results for input(s): HGBA1C in the last 72 hours. Lipid Profile No results for input(s): CHOL, HDL, LDLCALC, TRIG, CHOLHDL, LDLDIRECT in the last 72 hours. Thyroid  function studies No results for input(s): TSH, T4TOTAL, T3FREE, THYROIDAB in the last 72 hours.  Invalid input(s): FREET3 Anemia work up No results for input(s): VITAMINB12, FOLATE, FERRITIN, TIBC, IRON, RETICCTPCT in the last 72  hours. Urinalysis    Component Value Date/Time   COLORURINE YELLOW (A) 04/14/2024 2005   APPEARANCEUR CLEAR (A) 04/14/2024 2005   LABSPEC 1.012 04/14/2024 2005   PHURINE 5.0 04/14/2024 2005   GLUCOSEU NEGATIVE 04/14/2024 2005   HGBUR NEGATIVE 04/14/2024 2005   BILIRUBINUR NEGATIVE 04/14/2024 2005   BILIRUBINUR Negative 05/19/2023 1457   KETONESUR NEGATIVE 04/14/2024 2005   PROTEINUR NEGATIVE 04/14/2024 2005   UROBILINOGEN 0.2 05/19/2023 1457   NITRITE NEGATIVE 04/14/2024 2005   LEUKOCYTESUR NEGATIVE 04/14/2024 2005   Sepsis Labs Recent Labs  Lab 05/05/24 1548  WBC 13.2*   Microbiology Recent Results (from the past 240 hours)  Culture, blood (routine x 2)     Status: None (Preliminary result)   Collection Time: 05/05/24  3:48 PM   Specimen: BLOOD  Result Value Ref Range Status   Specimen Description BLOOD LEFT ANTECUBITAL  Final   Special Requests   Final    BOTTLES DRAWN AEROBIC AND ANAEROBIC Blood Culture results may not be optimal due to an inadequate volume of blood received in culture bottles   Culture   Final    NO GROWTH 4 DAYS Performed at [facility], [address] [place], [place] [address]    Report Status PENDING  Incomplete  Culture, blood (routine x 2)     Status: None (Preliminary result)   Collection Time: 05/05/24  9:03 PM  Specimen: BLOOD  Result Value Ref Range Status   Specimen Description BLOOD BLOOD LEFT HAND  Final   Special Requests   Final    BOTTLES DRAWN AEROBIC AND ANAEROBIC Blood Culture results may not be optimal due to an inadequate volume of blood received in culture bottles   Culture   Final    NO GROWTH 4 DAYS Performed at [facility], [address] [place], [place] [address]    Report Status PENDING  Incomplete     Time coordinating discharge: 40 minutes  SIGNED:   Calvin KATHEE Robson, MD  [facility] 05/09/2024, 12:16 PM Pager   If 7PM-7AM, please contact night-coverage

## 2024-05-09 NOTE — Progress Notes (Signed)
 Patient provided with information for [facility]. Encouraged to call for intake questions [phone] anytime Monday through Saturday between the hours of 8-11 am and 1-4 pm.

## 2024-05-09 NOTE — TOC Transition Note (Signed)
 Transition of Care [facility]) - Discharge Note   Patient Details  Name: [name] MRN: [id number] Date of Birth: 07/15/1981  Transition of Care [facility]) CM/SW Contact:  Elouise LULLA Capri, RN 05/09/2024, 1:38 PM   Clinical Narrative:     Discharge orders, discharge summary noted. CM to patient's room regarding pending discharge. Per patient no voiced complaints and or concerns. Per patient, patient's mother Ailleen will provide transportation home. No identified needs.   Final next level of care: Home/Self Care Barriers to Discharge: No Barriers Identified   Patient Goals and CMS Choice    Home/self care   Discharge Placement     Home/self care          Discharge Plan and Services Additional resources added to the After Visit Summary for    Social Drivers of Health (SDOH) Interventions SDOH Screenings   Food Insecurity: No Food Insecurity (05/05/2024)  Housing: Low Risk  (05/05/2024)  Transportation Needs: No Transportation Needs (05/05/2024)  Utilities: Not At Risk (05/05/2024)  Depression (PHQ2-9): Low Risk  (10/27/2023)  Financial Resource Strain: Low Risk  (04/19/2023)  Recent Concern: Financial Resource Strain - High Risk (04/19/2023)  Social Connections: Socially Isolated (05/05/2024)  Tobacco Use: Low Risk  (05/05/2024)  Recent Concern: Tobacco Use - Medium Risk (05/03/2024)   Received from [facility]     Readmission Risk Interventions     No data to display

## 2024-05-09 NOTE — Progress Notes (Signed)
 [facility] currently has no male beds. Will notify nurse navigator when beds become available.

## 2024-05-09 NOTE — Progress Notes (Signed)
 PROGRESS NOTE    [name]  FMW:[id number] DOB: [birth year] DOA: 05/05/2024 PCP: [name] SQUIBB, PA    Brief Narrative:  43 y.o. 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   Patient was recently hospitalized from 6/6 - 6/9 due to MRSA bacteremia. That admission was recommended by Dr. Epifanio of ID for completing his inpatient treatment for MRSA bacteremia. Pt was treated with IV vancomycin  in hospitqal, however, he left AMA again on 6/9 without warning.  2D echo was negative for vegetation. Pt states that he has been prescribed linezolid by infectious disease and states he has been compliant with this medication. Patient reports that ulcers to his lower extremities have been in healing process.   Patient reports that his last use of IV fentanyl was earlier this morning, and he has since developed withdrawal symptoms including malaise, hiccups, nausea, vomiting, diaphoresis, shaking, pain all over.  No diarrhea, cough, SOB.  No symptoms of UTI.  No fever or chills.  He states that he cannot keep anything down, including medications due to severe nausea and vomiting.  Assessment & Plan:   Principal Problem:   Opioid withdrawal (HCC) Active Problems:   MRSA bacteremia   Opioid use disorder   Polysubstance abuse (HCC)   IVDU (intravenous drug user)   Hypokalemia   Infected ulcer of skin (HCC)   Overweight (BMI 25.0-29.9)   Hypophosphatemia   Opioid withdrawal (HCC):  pt has malaise, hiccups, nausea, vomiting, diaphoresis, shaking, pain all over, elevated blood pressure, 179/78, clinically consistent with opioid withdrawal.  Patient admits to IV fentanyl use.  States uses 3 g IV per day. Had a lengthy conversation with the  patient explaining that he is in severe opioid withdrawal and while it may be very difficult and painful it is inherently not hazardous to his life.  He expresses desire to discontinue use of IV fentanyl. 7/1: Withdrawal symptoms have much improved Plan: Continue COWS protocol Continue Suboxone  Discontinue clonidine  Wean Ativan  Wean Robaxin As needed Thorazine  Opioid use disorder, polysubstance abuse, IVDU (intravenous drug user):  UDS positive for cocaine. Plan: Counsel patient TOC consult for [facility] resources If any Lang attempting to establish outpatient follow-up   MRSA bacteremia:  WBC 13.2, no fever. Plan: On IV daptomycin per infectious disease recommendations Will need ID follow-up prior to discharge  Chronic skin ulcer in both legs: WOC consult   Hypokalemia:  Patient has been refusing labs   Overweight (BMI 25.0-29.9):  Body weight 93.4 kg, BMI 27.17 Complicates care     DVT prophylaxis: SQ Lovenox  Code Status: Full Family Communication: None, call offered, but declined Disposition Plan: Status is: Inpatient Remains inpatient appropriate because: Severe opiate withdrawal   Level of care: Med-Surg  Consultants:  None  Procedures:  None  Antimicrobials: IV daptomycin Ciprofloxacin   Subjective: Seen and examined resting in bed.  Withdrawal symptoms much improved.  Objective: Vitals:   05/08/24 2223 05/09/24 0223 05/09/24 0623 05/09/24 1022  BP: 114/78 (!) 142/93 121/88 (!) 156/111  Pulse: (!) 105 85 97 75  Resp: 16 17  20   Temp:  98.9 F (37.2 C) 98.5 F (36.9 C) 98.8 F (37.1 C)  TempSrc: Oral Oral Oral Oral  SpO2: 98% 100% 97% 100%  Weight:      Height:  Intake/Output Summary (Last 24 hours) at 05/09/2024 1113 Last data filed at 05/08/2024 1509 Gross per 24 hour  Intake --  Output 800 ml  Net -800 ml   Filed Weights   05/05/24 1516 05/06/24 0037  Weight: 93.4 kg 94.5 kg    Examination:  General exam: Appears  fatigued otherwise stable no distress Respiratory system: Lungs clear.  Normal work of breathing.  Room air Cardiovascular system: S1 S2, RRR, no murmurs, no pedal edema Gastrointestinal system: Soft, NT/ND, hyperactive bowel sounds Central nervous system: Alert and oriented. No focal neurological deficits. Extremities: Symmetric 5 x 5 power. Skin: Chronic appearing wounds bilateral lower extremities Psychiatry: Judgement and insight appear impaired. Mood & affect anxious.     Data Reviewed: I have personally reviewed following labs and imaging studies  CBC: Recent Labs  Lab 05/05/24 1548  WBC 13.2*  NEUTROABS 11.8*  HGB 13.7  HCT 43.2  MCV 87.8  PLT 327   Basic Metabolic Panel: Recent Labs  Lab 05/05/24 1539 05/05/24 1548  NA  --  138  K  --  3.3*  CL  --  100  CO2  --  24  GLUCOSE  --  124*  BUN  --  9  CREATININE  --  1.05  CALCIUM   --  9.7  MG 1.9  --   PHOS  --  <1.0*   GFR: Estimated Creatinine Clearance: 109.4 mL/min (by C-G formula based on SCr of 1.05 mg/dL). Liver Function Tests: Recent Labs  Lab 05/05/24 1548  AST 30  ALT 14  ALKPHOS 89  BILITOT 1.2  PROT 9.4*  ALBUMIN 4.5   No results for input(s): LIPASE, AMYLASE in the last 168 hours. No results for input(s): AMMONIA in the last 168 hours. Coagulation Profile: Recent Labs  Lab 05/05/24 2103  INR 1.1   Cardiac Enzymes: No results for input(s): CKTOTAL, CKMB, CKMBINDEX, TROPONINI in the last 168 hours. BNP (last 3 results) No results for input(s): PROBNP in the last 8760 hours. HbA1C: No results for input(s): HGBA1C in the last 72 hours. CBG: Recent Labs  Lab 05/06/24 0449  GLUCAP 150*   Lipid Profile: No results for input(s): CHOL, HDL, LDLCALC, TRIG, CHOLHDL, LDLDIRECT in the last 72 hours. Thyroid  Function Tests: No results for input(s): TSH, T4TOTAL, FREET4, T3FREE, THYROIDAB in the last 72 hours. Anemia Panel: No results for  input(s): VITAMINB12, FOLATE, FERRITIN, TIBC, IRON, RETICCTPCT in the last 72 hours. Sepsis Labs: No results for input(s): PROCALCITON, LATICACIDVEN in the last 168 hours.  Recent Results (from the past 240 hours)  Culture, blood (routine x 2)     Status: None (Preliminary result)   Collection Time: 05/05/24  3:48 PM   Specimen: BLOOD  Result Value Ref Range Status   Specimen Description BLOOD LEFT ANTECUBITAL  Final   Special Requests   Final    BOTTLES DRAWN AEROBIC AND ANAEROBIC Blood Culture results may not be optimal due to an inadequate volume of blood received in culture bottles   Culture   Final    NO GROWTH 4 DAYS Performed at [facility], [address], [place], [place] [address]    Report Status PENDING  Incomplete  Culture, blood (routine x 2)     Status: None (Preliminary result)   Collection Time: 05/05/24  9:03 PM   Specimen: BLOOD  Result Value Ref Range Status   Specimen Description BLOOD BLOOD LEFT HAND  Final   Special Requests   Final    BOTTLES  DRAWN AEROBIC AND ANAEROBIC Blood Culture results may not be optimal due to an inadequate volume of blood received in culture bottles   Culture   Final    NO GROWTH 4 DAYS Performed at [facility], [address], [place], [place] [address]    Report Status PENDING  Incomplete         Radiology Studies: No results found.      Scheduled Meds:  buprenorphine -naloxone   1 tablet Sublingual Daily   ciprofloxacin  500 mg Oral BID   enoxaparin  (LOVENOX ) injection  40 mg Subcutaneous Q24H   methocarbamol  500 mg Oral TID   Continuous Infusions:  DAPTOmycin 800 mg (05/08/24 2157)     LOS: 4 days     Calvin KATHEE Robson, MD [facility]   If 7PM-7AM, please contact night-coverage  05/09/2024, 11:13 AM

## 2024-05-10 ENCOUNTER — Telehealth: Payer: Self-pay | Admitting: *Deleted

## 2024-05-10 LAB — CULTURE, BLOOD (ROUTINE X 2)
Culture: NO GROWTH
Culture: NO GROWTH

## 2024-05-10 NOTE — Transitions of Care (Post Inpatient/ED Visit) (Signed)
   05/10/2024  Name: Simone Tuckey MRN: [id number] DOB: 1981/09/13  Today's TOC FU Call Status: Today's TOC FU Call Status:: Unsuccessful Call (1st Attempt) Unsuccessful Call (1st Attempt) Date: 05/10/24  Attempted to reach the patient regarding the most recent Inpatient visit.  Received automated outgoing voice message stating that the person you are trying to call has a voice mail box that is full; please hang up and try your call again later; unable to leave voice message requesting call back   Follow Up Plan: Additional outreach attempts will be made to reach the patient to complete the Transitions of Care (Post Inpatient/ED visit) call.   Pls call/ message for questions,  Jezlyn Westerfield Mckinney Geoffrey Hynes, RN, BSN, CCRN Alumnus RN Care Manager  Transitions of Care  [facility] - [facility] [phone]: direct office

## 2024-05-11 ENCOUNTER — Telehealth: Payer: Self-pay | Admitting: *Deleted

## 2024-05-11 NOTE — Transitions of Care (Post Inpatient/ED Visit) (Signed)
   05/11/2024  Name: [name] MRN: [id number] DOB: October 10, 1981  Today's TOC FU Call Status: Today's TOC FU Call Status:: Unsuccessful Call (2nd Attempt) Unsuccessful Call (2nd Attempt) Date: 05/11/24  Attempted to reach the patient regarding the most recent Inpatient visit.  Received automated outgoing voice message stating that the person you are trying to call has a voice mail box that is full; please hang up and try your call again later; unable to leave voice message requesting call back   Follow Up Plan: Additional outreach attempts will be made to reach the patient to complete the Transitions of Care (Post Inpatient/ED visit) call.   Pls call/ message for questions,  Zailah Zagami Mckinney Ledarrius Beauchaine, RN, BSN, CCRN Alumnus RN Care Manager  Transitions of Care  [facility] - [facility] [phone]: direct office

## 2024-05-15 ENCOUNTER — Telehealth: Payer: Self-pay | Admitting: *Deleted

## 2024-05-15 NOTE — Transitions of Care (Post Inpatient/ED Visit) (Signed)
   05/15/2024  Name: [name] MRN: [id number] DOB: Oct 28, 1981  Today's TOC FU Call Status: Today's TOC FU Call Status:: Unsuccessful Call (3rd Attempt) Unsuccessful Call (3rd Attempt) Date: 05/15/24  Attempted to reach the patient regarding the most recent Inpatient visit.  Received automated outgoing voice message stating that the person you are trying to call has a voice mail box that is full; please hang up and try your call again later; unable to leave voice message requesting call back   Follow Up Plan: No further outreach attempts will be made at this time. We have been unable to contact the patient.  Pls call/ message for questions,  Kayman Snuffer Mckinney Signora Zucco, RN, BSN, CCRN Alumnus RN Care Manager  Transitions of Care  [facility] - [facility] [phone]: direct office

## 2024-08-06 ENCOUNTER — Emergency Department

## 2024-08-06 ENCOUNTER — Inpatient Hospital Stay

## 2024-08-06 ENCOUNTER — Other Ambulatory Visit: Payer: Self-pay

## 2024-08-06 ENCOUNTER — Inpatient Hospital Stay
Admission: EM | Admit: 2024-08-06 | Discharge: 2024-08-20 | DRG: 603 | Discharge status: Against Medical Advice | Attending: Internal Medicine | Admitting: Internal Medicine

## 2024-08-06 ENCOUNTER — Encounter: Payer: Self-pay | Admitting: Emergency Medicine

## 2024-08-06 DIAGNOSIS — L0889 Other specified local infections of the skin and subcutaneous tissue: Principal | ICD-10-CM | POA: Diagnosis present

## 2024-08-06 DIAGNOSIS — L97929 Non-pressure chronic ulcer of unspecified part of left lower leg with unspecified severity: Secondary | ICD-10-CM | POA: Diagnosis not present

## 2024-08-06 DIAGNOSIS — T148XXA Other injury of unspecified body region, initial encounter: Secondary | ICD-10-CM | POA: Diagnosis present

## 2024-08-06 DIAGNOSIS — R7881 Bacteremia: Secondary | ICD-10-CM | POA: Diagnosis present

## 2024-08-06 DIAGNOSIS — F1120 Opioid dependence, uncomplicated: Secondary | ICD-10-CM | POA: Diagnosis present

## 2024-08-06 DIAGNOSIS — L97828 Non-pressure chronic ulcer of other part of left lower leg with other specified severity: Secondary | ICD-10-CM | POA: Diagnosis present

## 2024-08-06 DIAGNOSIS — Z9081 Acquired absence of spleen: Secondary | ICD-10-CM | POA: Diagnosis not present

## 2024-08-06 DIAGNOSIS — Z1619 Resistance to other specified beta lactam antibiotics: Secondary | ICD-10-CM | POA: Diagnosis present

## 2024-08-06 DIAGNOSIS — L97818 Non-pressure chronic ulcer of other part of right lower leg with other specified severity: Secondary | ICD-10-CM | POA: Diagnosis present

## 2024-08-06 DIAGNOSIS — B9562 Methicillin resistant Staphylococcus aureus infection as the cause of diseases classified elsewhere: Secondary | ICD-10-CM | POA: Diagnosis present

## 2024-08-06 DIAGNOSIS — Z8614 Personal history of Methicillin resistant Staphylococcus aureus infection: Secondary | ICD-10-CM | POA: Diagnosis present

## 2024-08-06 DIAGNOSIS — Z22359 Carrier of enterobacterales, unspecified: Secondary | ICD-10-CM | POA: Diagnosis not present

## 2024-08-06 DIAGNOSIS — B9620 Unspecified Escherichia coli [E. coli] as the cause of diseases classified elsewhere: Secondary | ICD-10-CM | POA: Diagnosis not present

## 2024-08-06 DIAGNOSIS — N289 Disorder of kidney and ureter, unspecified: Secondary | ICD-10-CM | POA: Diagnosis present

## 2024-08-06 DIAGNOSIS — F1490 Cocaine use, unspecified, uncomplicated: Secondary | ICD-10-CM | POA: Diagnosis not present

## 2024-08-06 DIAGNOSIS — L089 Local infection of the skin and subcutaneous tissue, unspecified: Secondary | ICD-10-CM | POA: Diagnosis present

## 2024-08-06 DIAGNOSIS — Z604 Social exclusion and rejection: Secondary | ICD-10-CM | POA: Diagnosis present

## 2024-08-06 DIAGNOSIS — Z87898 Personal history of other specified conditions: Secondary | ICD-10-CM | POA: Diagnosis not present

## 2024-08-06 DIAGNOSIS — F1410 Cocaine abuse, uncomplicated: Secondary | ICD-10-CM | POA: Diagnosis present

## 2024-08-06 DIAGNOSIS — F1990 Other psychoactive substance use, unspecified, uncomplicated: Secondary | ICD-10-CM | POA: Diagnosis present

## 2024-08-06 DIAGNOSIS — L97919 Non-pressure chronic ulcer of unspecified part of right lower leg with unspecified severity: Secondary | ICD-10-CM | POA: Diagnosis not present

## 2024-08-06 DIAGNOSIS — B9689 Other specified bacterial agents as the cause of diseases classified elsewhere: Secondary | ICD-10-CM | POA: Diagnosis not present

## 2024-08-06 DIAGNOSIS — F1910 Other psychoactive substance abuse, uncomplicated: Secondary | ICD-10-CM | POA: Diagnosis present

## 2024-08-06 DIAGNOSIS — Z5329 Procedure and treatment not carried out because of patient's decision for other reasons: Secondary | ICD-10-CM | POA: Diagnosis not present

## 2024-08-06 DIAGNOSIS — Z5189 Encounter for other specified aftercare: Principal | ICD-10-CM

## 2024-08-06 LAB — CBC WITH DIFFERENTIAL/PLATELET
Abs Immature Granulocytes: 0.03 K/uL (ref 0.00–0.07)
Basophils Absolute: 0.1 K/uL (ref 0.0–0.1)
Basophils Relative: 0 %
Eosinophils Absolute: 0.1 K/uL (ref 0.0–0.5)
Eosinophils Relative: 1 %
HCT: 32.5 % — ABNORMAL LOW (ref 39.0–52.0)
Hemoglobin: 10.2 g/dL — ABNORMAL LOW (ref 13.0–17.0)
Immature Granulocytes: 0 %
Lymphocytes Relative: 15 %
Lymphs Abs: 1.7 K/uL (ref 0.7–4.0)
MCH: 27.4 pg (ref 26.0–34.0)
MCHC: 31.4 g/dL (ref 30.0–36.0)
MCV: 87.4 fL (ref 80.0–100.0)
Monocytes Absolute: 0.6 K/uL (ref 0.1–1.0)
Monocytes Relative: 5 %
Neutro Abs: 8.9 K/uL — ABNORMAL HIGH (ref 1.7–7.7)
Neutrophils Relative %: 79 %
Platelets: 527 K/uL — ABNORMAL HIGH (ref 150–400)
RBC: 3.72 MIL/uL — ABNORMAL LOW (ref 4.22–5.81)
RDW: 15.4 % (ref 11.5–15.5)
WBC: 11.4 K/uL — ABNORMAL HIGH (ref 4.0–10.5)
nRBC: 0 % (ref 0.0–0.2)

## 2024-08-06 LAB — URINALYSIS, COMPLETE (UACMP) WITH MICROSCOPIC
Bacteria, UA: NONE SEEN
Bilirubin Urine: NEGATIVE
Glucose, UA: NEGATIVE mg/dL
Hgb urine dipstick: NEGATIVE
Ketones, ur: NEGATIVE mg/dL
Leukocytes,Ua: NEGATIVE
Nitrite: NEGATIVE
Protein, ur: NEGATIVE mg/dL
Specific Gravity, Urine: 1.003 — ABNORMAL LOW (ref 1.005–1.030)
Squamous Epithelial / HPF: 0 /HPF (ref 0–5)
WBC, UA: 0 WBC/hpf (ref 0–5)
pH: 7 (ref 5.0–8.0)

## 2024-08-06 LAB — COMPREHENSIVE METABOLIC PANEL WITH GFR
ALT: 10 U/L (ref 0–44)
AST: 15 U/L (ref 15–41)
Albumin: 3.6 g/dL (ref 3.5–5.0)
Alkaline Phosphatase: 75 U/L (ref 38–126)
Anion gap: 13 (ref 5–15)
BUN: 14 mg/dL (ref 6–20)
CO2: 26 mmol/L (ref 22–32)
Calcium: 8.9 mg/dL (ref 8.9–10.3)
Chloride: 98 mmol/L (ref 98–111)
Creatinine, Ser: 1.08 mg/dL (ref 0.61–1.24)
GFR, Estimated: >60 mL/min (ref >60)
Glucose, Bld: 100 mg/dL — ABNORMAL HIGH (ref 70–99)
Potassium: 4 mmol/L (ref 3.5–5.1)
Sodium: 137 mmol/L (ref 135–145)
Total Bilirubin: 0.5 mg/dL (ref 0.0–1.2)
Total Protein: 8.7 g/dL — ABNORMAL HIGH (ref 6.5–8.1)

## 2024-08-06 LAB — LACTIC ACID, PLASMA
Lactic Acid, Venous: 0.7 mmol/L (ref 0.5–1.9)
Lactic Acid, Venous: 0.9 mmol/L (ref 0.5–1.9)

## 2024-08-06 LAB — URINE DRUG SCREEN, QUALITATIVE (ARMC ONLY)
Amphetamines, Ur Screen: NOT DETECTED
Barbiturates, Ur Screen: NOT DETECTED
Benzodiazepine, Ur Scrn: NOT DETECTED
Cannabinoid 50 Ng, Ur ~~LOC~~: NOT DETECTED
Cocaine Metabolite,Ur ~~LOC~~: POSITIVE — AB
MDMA (Ecstasy)Ur Screen: NOT DETECTED
Methadone Scn, Ur: POSITIVE — AB
Opiate, Ur Screen: NOT DETECTED
Phencyclidine (PCP) Ur S: NOT DETECTED
Tricyclic, Ur Screen: NOT DETECTED

## 2024-08-06 LAB — PROTIME-INR
INR: 1 (ref 0.8–1.2)
Prothrombin Time: 13.9 s (ref 11.4–15.2)

## 2024-08-06 LAB — PROCALCITONIN: Procalcitonin: 0.51 ng/mL

## 2024-08-06 MED ORDER — ACETAMINOPHEN 650 MG RE SUPP: 650.0000 mg | Freq: Four times a day (QID) | RECTAL | Status: DC | PRN

## 2024-08-06 MED ORDER — SODIUM CHLORIDE 0.9% FLUSH
3.0000 mL | Freq: Two times a day (BID) | INTRAVENOUS | Status: DC
  Administered 2024-08-06 – 2024-08-19 (×27): 3 mL via INTRAVENOUS

## 2024-08-06 MED ORDER — CEFEPIME HCL 2 G IV SOLR
2.0000 g | Freq: Three times a day (TID) | INTRAVENOUS | Status: DC
  Administered 2024-08-06 – 2024-08-07 (×3): 2 g via INTRAVENOUS
  Filled 2024-08-06 (×4): qty 12.5

## 2024-08-06 MED ORDER — METHADONE HCL 10 MG PO TABS
130.0000 mg | ORAL_TABLET | Freq: Once | ORAL | Status: AC
  Administered 2024-08-06: 130 mg via ORAL
  Filled 2024-08-06: qty 13

## 2024-08-06 MED ORDER — SODIUM CHLORIDE 0.9 % IV BOLUS
1000.0000 mL | Freq: Once | INTRAVENOUS | Status: AC
  Administered 2024-08-06: 1000 mL via INTRAVENOUS

## 2024-08-06 MED ORDER — ACETAMINOPHEN 325 MG PO TABS
650.0000 mg | ORAL_TABLET | Freq: Four times a day (QID) | ORAL | Status: DC | PRN
  Filled 2024-08-06 (×2): qty 2

## 2024-08-06 MED ORDER — METRONIDAZOLE 500 MG/100ML IV SOLN
500.0000 mg | Freq: Two times a day (BID) | INTRAVENOUS | Status: DC
Start: 2024-08-06 — End: 2024-08-07
  Administered 2024-08-06 – 2024-08-07 (×2): 500 mg via INTRAVENOUS
  Filled 2024-08-06 (×2): qty 100

## 2024-08-06 MED ORDER — ENOXAPARIN SODIUM 40 MG/0.4ML IJ SOSY
40.0000 mg | PREFILLED_SYRINGE | INTRAMUSCULAR | Status: DC
  Administered 2024-08-06 – 2024-08-15 (×8): 40 mg via SUBCUTANEOUS
  Filled 2024-08-06 (×13): qty 0.4

## 2024-08-06 MED ORDER — POLYETHYLENE GLYCOL 3350 17 G PO PACK
17.0000 g | PACK | Freq: Every day | ORAL | Status: DC | PRN
  Filled 2024-08-06: qty 1

## 2024-08-06 MED ORDER — PIPERACILLIN-TAZOBACTAM 3.375 G IVPB
3.3750 g | Freq: Once | INTRAVENOUS | Status: AC
  Administered 2024-08-06: 3.375 g via INTRAVENOUS
  Filled 2024-08-06: qty 50

## 2024-08-06 MED ORDER — METHADONE HCL 5 MG/5ML PO SOLN
120.0000 mg | Freq: Every day | ORAL | Status: DC
  Filled 2024-08-06: qty 120

## 2024-08-06 MED ORDER — VANCOMYCIN HCL 1500 MG/300ML IV SOLN
1500.0000 mg | Freq: Two times a day (BID) | INTRAVENOUS | Status: DC
  Administered 2024-08-06 – 2024-08-19 (×27): 1500 mg via INTRAVENOUS
  Filled 2024-08-06 (×27): qty 300

## 2024-08-06 MED ORDER — VANCOMYCIN HCL 2000 MG/400ML IV SOLN
2000.0000 mg | Freq: Once | INTRAVENOUS | Status: AC
  Administered 2024-08-06: 2000 mg via INTRAVENOUS
  Filled 2024-08-06: qty 400

## 2024-08-06 NOTE — Consult Note (Signed)
 WOC Nurse Consult Note: Reason for Consult: wounds IVDRU Wound type: Left and right shoulder: unknown etiology; possible IV DU LE  IVDU? Pressure Injury POA: NA Measurement:see nursing notes Wound bed:100% pink Drainage (amount, consistency, odor) see nursing flow sheets  Periwound:intact  Dressing procedure/placement/frequency: Cleanse the LE wounds with saline Apply Vashe moist gauze (moist not wet) Gerlean #[id number]) Wrap from toes to knees with ABD and kerlix daily.  Deryl Giroux [facility], CNS, CWON-AP [phone]

## 2024-08-06 NOTE — Plan of Care (Signed)

## 2024-08-06 NOTE — H&P (Addendum)
 History and Physical   [name] FMW:[id number] DOB: [birth year] DOA: 08/06/2024  PCP: Kirk Toribio SQUIBB, PA   Patient coming from: Home  Chief Complaint: Worsening wound  HPI: [name] is a 43 y.o. male with medical history significant of IV drug use, opioid withdrawal, delusional.  Diabetes, kidney disease, splenectomy presenting with worsening wounds.  Patient had recent prolonged course of treatment for wound infection and bacteremia starting in June.  He was treated on and off for his infection since the beginning of June throughout that month in part due to recurrent hospitalizations in the setting of leaving AMA.  Ultimately he completed a course of p.o. linezolid  outpatient as well as some other antibiotics.  Was able to get a TTE done which was negative but did not get a TEE done.  Did have negative cultures on June 6 and repeat negative cultures on June 21.  Was stable at recent infectious disease follow-up outpatient on 8/1.  At that time he was off antibiotics.  He was prescribed as needed Augmentin  for fevers in the setting of asplenia.  Worsening pain, redness, appearance of fluid from the wound for the past couple weeks.  Increase in swelling of the left wound in the last 2 days.  Patient has noted worsening changes to his skin ulcerations with worsening swelling, erythema, pain.  Also is reporting associated nausea, fatigue, subjective fevers and chills.  Denies chest pain, shortness of breath, abdominal pain, constipation, diarrhea.  ED Course: Vital signs in ED stable.  Lab workup included CMP with glucose 100, calcium  8.7.  CBC with leukocytosis to 11.4, hemoglobin 10.2 down from previous baseline of 11-13, platelets 527.  PT and INR normal.  Lactic acid normal with repeat pending.  Procalcitonin 0.51.  Blood cultures pending.  Chest x-ray with no acute abnormality.  Patient started on vancomycin  and Zosyn  in the ED.   Review of Systems: As per HPI otherwise all  other systems reviewed and are negative.  Past Medical History:  Diagnosis Date   Allergy    Opioid abuse (HCC) 03/2019   on-going daily use of IV Fentanyl   Sepsis (HCC) 04/12/2024    Past Surgical History:  Procedure Laterality Date   NO PAST SURGERIES     SPLENECTOMY      Social History  reports that he has never smoked. He has never used smokeless tobacco. He reports current drug use. Drugs: IV, Cocaine, and Fentanyl. He reports that he does not drink alcohol.  No Known Allergies  Family History  Problem Relation Age of Onset   Hypertension Mother    Diabetes Mother   Reviewed admission  Prior to Admission medications   Medication Sig Start Date End Date Taking? Authorizing Provider  methadone (DOLOPHINE) 1 MG/1ML solution Take 120 mg by mouth daily.    [provider]  methocarbamol  (ROBAXIN ) 500 MG tablet Take 1 tablet (500 mg total) by mouth every 8 (eight) hours as needed for muscle spasms. 05/09/24   Kirk Calvin NOVAK, MD  ondansetron  (ZOFRAN ) 4 MG tablet Take 1 tablet (4 mg total) by mouth daily as needed for nausea or vomiting. 05/09/24 05/09/25  Kirk Calvin NOVAK, MD    Physical Exam: Vitals:   08/06/24 1105 08/06/24 1130  BP: 138/87 130/86  Pulse: 96 89  Resp: (!) 21 13  Temp: 98.6 F (37 C)   TempSrc: Oral   SpO2: 100% 98%  Weight: 95.3 kg   Height: 6' 1 (1.854 m)     Physical  Exam Constitutional:      General: He is not in acute distress.    Appearance: Normal appearance.  HENT:     Head: Normocephalic and atraumatic.     Mouth/Throat:     Mouth: Mucous membranes are moist.     Pharynx: Oropharynx is clear.  Eyes:     Extraocular Movements: Extraocular movements intact.     Pupils: Pupils are equal, round, and reactive to light.  Cardiovascular:     Rate and Rhythm: Normal rate and regular rhythm.     Pulses: Normal pulses.     Heart sounds: Murmur heard.  Pulmonary:     Effort: Pulmonary effort is normal. No respiratory  distress.     Breath sounds: Normal breath sounds.  Abdominal:     General: Bowel sounds are normal. There is no distension.     Palpations: Abdomen is soft.     Tenderness: There is no abdominal tenderness.  Musculoskeletal:        General: No swelling or deformity.     Comments: Lower extremity wounds with swelling, erythema, warmth.  Skin:    General: Skin is warm and dry.  Neurological:     General: No focal deficit present.     Mental Status: Mental status is at baseline.       Labs on Admission: I have personally reviewed following labs and imaging studies  CBC: Recent Labs  Lab 08/06/24 1131  WBC 11.4*  NEUTROABS 8.9*  HGB 10.2*  HCT 32.5*  MCV 87.4  PLT 527*    Basic Metabolic Panel: Recent Labs  Lab 08/06/24 1131  NA 137  K 4.0  CL 98  CO2 26  GLUCOSE 100*  BUN 14  CREATININE 1.08  CALCIUM  8.9    GFR: Estimated Creatinine Clearance: 100.7 mL/min (by C-G formula based on SCr of 1.08 mg/dL).  Liver Function Tests: Recent Labs  Lab 08/06/24 1131  AST 15  ALT 10  ALKPHOS 75  BILITOT 0.5  PROT 8.7*  ALBUMIN 3.6    Urine analysis:    Component Value Date/Time   COLORURINE YELLOW (A) 04/14/2024 2005   APPEARANCEUR CLEAR (A) 04/14/2024 2005   LABSPEC 1.012 04/14/2024 2005   PHURINE 5.0 04/14/2024 2005   GLUCOSEU NEGATIVE 04/14/2024 2005   HGBUR NEGATIVE 04/14/2024 2005   BILIRUBINUR NEGATIVE 04/14/2024 2005   BILIRUBINUR Negative 05/19/2023 1457   KETONESUR NEGATIVE 04/14/2024 2005   PROTEINUR NEGATIVE 04/14/2024 2005   UROBILINOGEN 0.2 05/19/2023 1457   NITRITE NEGATIVE 04/14/2024 2005   LEUKOCYTESUR NEGATIVE 04/14/2024 2005    Radiological Exams on Admission: DG Chest Port 1 View Result Date: 08/06/2024 EXAM: 1 VIEW(S) XRAY OF THE CHEST 08/06/2024 11:43:00 AM COMPARISON: 04/14/2024 CLINICAL HISTORY: Questionable sepsis - evaluate for abnormality. FINDINGS: LUNGS AND PLEURA: No focal pulmonary opacity. No pulmonary edema. No pleural  effusion. No pneumothorax. HEART AND MEDIASTINUM: No acute abnormality of the cardiac and mediastinal silhouettes. BONES AND SOFT TISSUES: No acute osseous abnormality. IMPRESSION: 1. No acute findings. Electronically signed by: Fonda Field MD 08/06/2024 12:07 PM EDT RP Workstation: [id number]   EKG: Independently reviewed. Sinus rhythm at 81 beats minute.  Significant recent wandering V1.  Nonspecific T wave changes.  Low voltage aVL.  Assessment/Plan Active Problems:   History of MRSA infection   Polysubstance abuse (HCC)   IVDU (intravenous drug user)   History of intravenous drug use in remission   Wound infection   Wound infection > Cleaning wound worsening changes to his wounds with  erythema, edema, warmth.  Also associated nausea, fatigue and subjective fever and chills. > Leukocytosis to 11.4 here.  Procalcitonin 0.51.  Blood cultures pending.  Chest x-ray clear.  Lactic acid normal. > Prolonged course of MRSA infection of wounds and bacteremia in June and July complicated by leaving AMA ultimately completing p.o. linezolid . > Received IV fluids, vancomycin , Zosyn  in the ED. - Monitor on telemetry - Continue with vancomycin , change Zosyn  to cefepime  and Flagyl  - Trend fever curve and WBC - Follow-up blood cultures - X-ray left lower extremity, consider CT pending results of this. - Supportive care  Substance use History of opioid withdrawal - Continue home methadone - UDS  DVT prophylaxis: Lovenox  Code Status:   Full Family Communication:  None on admission  Disposition Plan:   Patient is from:  Home  Anticipated DC to:  Home  Anticipated DC date:  2 to 5 days  Anticipated DC barriers: None  Consults called:  None Admission status:  Inpatient, telemetry  Severity of Illness: The appropriate patient status for this patient is INPATIENT. Inpatient status is judged to be reasonable and necessary in order to provide the required intensity of service to ensure the  patient's safety. The patient's presenting symptoms, physical exam findings, and initial radiographic and laboratory data in the context of their chronic comorbidities is felt to place them at high risk for further clinical deterioration. Furthermore, it is not anticipated that the patient will be medically stable for discharge from the hospital within 2 midnights of admission.   * I certify that at the point of admission it is my clinical judgment that the patient will require inpatient hospital care spanning beyond 2 midnights from the point of admission due to high intensity of service, high risk for further deterioration and high frequency of surveillance required.DEWAINE Marsa KATHEE Seena MD [facility]  How to contact the [facility] Attending or Consulting provider 7A - 7P or covering provider during after hours 7P -7A, for this patient?   Check the care team in [facility] and look for a) attending/consulting [facility] provider listed and b) the [facility] team listed Log into [email or web address] and use [facility] universal password to access. If you do not have the password, please contact the hospital operator. Locate the [facility] provider you are looking for under [facility] and page to a number that you can be directly reached. If you still have difficulty reaching the provider, please page the [facility] (Director on Call) for the Hospitalists listed on amion for assistance.  08/06/2024, 1:09 PM

## 2024-08-06 NOTE — Consult Note (Incomplete)
 WOC Nurse Consult Note: Reason for Consult: Wounds bilateral lowers and bilaterally upper back  Wound type: Pressure Injury POA: Yes/No/NA Measurement: Wound bed: Drainage (amount, consistency, odor)  Periwound: Dressing procedure/placement/frequency: Conservative sharp wound debridement (CSWD performed at the bedside):

## 2024-08-06 NOTE — Consult Note (Signed)
 Pharmacy Antibiotic Note  ASSESSMENT: 43 y.o. 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  Progressively worsening swelling with subjective chills/fevers. Patient had discharged with linezolid  and ciprofloxacin  from 04/2024 hospitalization. Pharmacy has been consulted to manage cefepime  and vancomycin  dosing. Patient also receiving metronidazole .  PLAN: Initiate cefepime  2g IV q8H Administer vancomycin  2000mg  IV x 1 as a loading dose, followed by 1500mg  IV q12H eAUC 497, Cmax 33.5, Cmin 13 Scr 1.08, IBW, Vd 0.72 L/kg Follow up culture results to assess for antibiotic optimization. Monitor renal function to assess for any necessary antibiotic dosing changes.  Patient measurements: Height: 6' 1 (185.4 cm) Weight: 95.3 kg (210 lb) IBW/kg (Calculated) : 79.9  Vital signs: Temp: 98.6 F (37 C) (09/28 1105) Temp Source: Oral (09/28 1105) BP: 130/86 (09/28 1130) Pulse Rate: 89 (09/28 1130) Recent Labs  Lab 08/06/24 1131  WBC 11.4*  CREATININE 1.08   Estimated Creatinine Clearance: 100.7 mL/min (by C-G formula based on SCr of 1.08 mg/dL).  Allergies: No Known Allergies  Antimicrobials this admission: Zosyn  9/28 x 1 Cefepime  9/28 >> Vancomycin  9/28 >> Metronidazole  9/28 >>  Dose adjustments this admission: N/a  Microbiology results: 9/28 BCx: sent    Thank you for allowing pharmacy to be a part of this patient's care.  Will M. Lenon, PharmD, BCPS Clinical Pharmacist 08/06/2024 1:13 PM

## 2024-08-06 NOTE — Consult Note (Signed)
 Please note that the [facility] nursing team is utilizing a standardized work plan to manage patient consults. We are triaging consults and will try to see the patients within 48 hours. Wound photos in the patient's chart allow us  to consult on the patient in the most efficient and timely manner.    Daimian Sudberry [facility], CNS, CWON-AP [phone]

## 2024-08-06 NOTE — ED Provider Notes (Signed)
 [facility] Provider Note    Event Date/Time   First MD Initiated Contact with Patient 08/06/24 1118     (approximate)   History   Wound Check   HPI  [name] is a 43 y.o. 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  Admitted for MRSA bacteremia at that time.  Discharged with Cipro  and  linezolid  at that time. Most recent clinic visit from 8/1 is reviewed, methadone 120 mg from a clinic in [place], no longer taking antibiotics at that time.  Seemingly improved wounds at that time.  Patient presents to the ED due to progressively worsening swelling, discomfort around chronic lower extremity wounds, reports sensation of feeling unwell with malaise, nausea and subjective chills/fevers.   Physical Exam   Triage Vital Signs: ED Triage Vitals  Encounter Vitals Group     BP 08/06/24 1105 138/87     Girls Systolic BP Percentile --      Girls Diastolic BP Percentile --      Boys Systolic BP Percentile --      Boys Diastolic BP Percentile --      Pulse Rate 08/06/24 1105 96     Resp 08/06/24 1105 (!) 21     Temp 08/06/24 1105 98.6 F (37 C)     Temp Source 08/06/24 1105 Oral     SpO2 08/06/24 1105 100 %     Weight 08/06/24 1105 210 lb (95.3 kg)     Height 08/06/24 1105 6' 1 (1.854 m)     Head Circumference --      Peak Flow --      Pain Score 08/06/24 1104 8     Pain Loc --      Pain Education --      Exclude from Growth Chart --     Most recent vital signs: Vitals:   08/06/24 1105 08/06/24 1130  BP: 138/87 130/86  Pulse: 96 89  Resp: (!) 21 13  Temp: 98.6 F (37 C)   SpO2: 100% 98%    General: Awake, no distress.  CV:  Good peripheral perfusion.  Resp:  Normal effort.  Abd:  No distention.  MSK:  Lower extremity wounds and swelling to bilateral lower extremities as pictured  below Neuro:  No focal deficits appreciated. Other:          ED Results / Procedures / Treatments   Labs (all labs ordered are listed, but only abnormal results are displayed) Labs Reviewed  COMPREHENSIVE METABOLIC PANEL WITH GFR - Abnormal; Notable for the following components:      Result Value   Glucose, Bld 100 (*)    Total Protein 8.7 (*)    All other components within normal limits  CBC WITH DIFFERENTIAL/PLATELET - Abnormal; Notable for the following components:   WBC 11.4 (*)    RBC 3.72 (*)    Hemoglobin 10.2 (*)    HCT 32.5 (*)    Platelets 527 (*)    Neutro Abs 8.9 (*)    All other components within normal limits  CULTURE, BLOOD (ROUTINE X 2)  CULTURE, BLOOD (ROUTINE X 2)  LACTIC ACID, PLASMA  PROTIME-INR  PROCALCITONIN  LACTIC ACID, PLASMA    EKG Sinus rhythm with a rate of 91 bpm.  Baseline wander for lead V1.  No clear signs of acute ischemia.  Normal intervals  RADIOLOGY CXR interpreted by me without evidence of acute cardiopulmonary pathology.  Official radiology report(s): DG Chest Port 1 View Result Date: 08/06/2024 EXAM: 1 VIEW(S) XRAY OF THE CHEST 08/06/2024 11:43:00 AM COMPARISON: 04/14/2024 CLINICAL HISTORY: Questionable sepsis - evaluate for abnormality. FINDINGS: LUNGS AND PLEURA: No focal pulmonary opacity. No pulmonary edema. No pleural effusion. No pneumothorax. HEART AND MEDIASTINUM: No acute abnormality of the cardiac and mediastinal silhouettes. BONES AND SOFT TISSUES: No acute osseous abnormality. IMPRESSION: 1. No acute findings. Electronically signed by: Fonda Field MD 08/06/2024 12:07 PM EDT RP Workstation: [id number]    PROCEDURES and INTERVENTIONS:  .1-3 Lead EKG Interpretation  Performed by: Claudene Rover, MD Authorized by: Claudene Rover, MD     Interpretation: normal     ECG rate:  94   ECG rate assessment: normal     Rhythm: sinus rhythm     Ectopy: none     Conduction: normal   .Critical Care  Performed by: Claudene Rover, MD Authorized by: Claudene Rover, MD   Critical care provider statement:    Critical care time (minutes):  30   Critical care time was exclusive of:  Separately billable procedures and treating other patients   Critical care was necessary to treat or prevent imminent or life-threatening deterioration of the following conditions:  Sepsis   Critical care was time spent personally by me on the following activities:  Development of treatment plan with patient or surrogate, discussions with consultants, evaluation of patient's response to treatment, examination of patient, ordering and review of laboratory studies, ordering and review of radiographic studies, ordering and performing treatments and interventions, pulse oximetry, re-evaluation of patient's condition and review of old charts   Medications  vancomycin  (VANCOREADY) IVPB 2000 mg/400 mL (2,000 mg Intravenous New Bag/Given 08/06/24 1225)  piperacillin -tazobactam (ZOSYN ) IVPB 3.375 g (3.375 g Intravenous New Bag/Given 08/06/24 1200)  methadone (DOLOPHINE) tablet 130 mg (130 mg Oral Given 08/06/24 1157)  sodium chloride  0.9 % bolus 1,000 mL (1,000 mLs Intravenous New Bag/Given 08/06/24 1227)     IMPRESSION / MDM / ASSESSMENT AND PLAN / ED COURSE  I reviewed the triage vital signs and the nursing notes.  Differential diagnosis includes, but is not limited to, sepsis, cellulitis, abscess, bacteremia, DVT  {Patient presents with symptoms of an acute illness or injury that is potentially life-threatening.  Patient with history of chronic lower extremity wounds, IVDU and recent admission for bacteremia presents to the ED with worsening wounds in the lower extremities, subjective fevers and chills.  Tachycardia and tachypnea meet sepsis criteria.  Hemodynamically stable.  Elevated procalcitonin WBC without lactic acidosis.  No metabolic derangements.  Draw cultures, initiate broad-spectrum antibiotics and consult medicine for admission.       FINAL CLINICAL IMPRESSION(S) / ED DIAGNOSES   Final diagnoses:  Encounter for wound re-check  Wound infection     Rx / DC Orders   ED Discharge Orders     None        Note:  This document was prepared using Dragon voice recognition software and may include unintentional dictation errors.   Claudene Rover, MD 08/06/24 [phone]

## 2024-08-06 NOTE — ED Triage Notes (Signed)
 Pt arrives via EMS in custody with reports of recent admission of MRSA. Pt has finished abx but no reports lower leg wounds and swelling.

## 2024-08-07 DIAGNOSIS — F1990 Other psychoactive substance use, unspecified, uncomplicated: Secondary | ICD-10-CM

## 2024-08-07 DIAGNOSIS — F1490 Cocaine use, unspecified, uncomplicated: Secondary | ICD-10-CM

## 2024-08-07 DIAGNOSIS — B9562 Methicillin resistant Staphylococcus aureus infection as the cause of diseases classified elsewhere: Secondary | ICD-10-CM | POA: Diagnosis not present

## 2024-08-07 DIAGNOSIS — T148XXA Other injury of unspecified body region, initial encounter: Secondary | ICD-10-CM | POA: Diagnosis not present

## 2024-08-07 DIAGNOSIS — L089 Local infection of the skin and subcutaneous tissue, unspecified: Secondary | ICD-10-CM | POA: Diagnosis not present

## 2024-08-07 DIAGNOSIS — L97919 Non-pressure chronic ulcer of unspecified part of right lower leg with unspecified severity: Secondary | ICD-10-CM

## 2024-08-07 DIAGNOSIS — L97929 Non-pressure chronic ulcer of unspecified part of left lower leg with unspecified severity: Secondary | ICD-10-CM

## 2024-08-07 DIAGNOSIS — R7881 Bacteremia: Secondary | ICD-10-CM | POA: Diagnosis not present

## 2024-08-07 LAB — CBC
HCT: 31.9 % — ABNORMAL LOW (ref 39.0–52.0)
Hemoglobin: 10 g/dL — ABNORMAL LOW (ref 13.0–17.0)
MCH: 27.5 pg (ref 26.0–34.0)
MCHC: 31.3 g/dL (ref 30.0–36.0)
MCV: 87.9 fL (ref 80.0–100.0)
Platelets: 510 K/uL — ABNORMAL HIGH (ref 150–400)
RBC: 3.63 MIL/uL — ABNORMAL LOW (ref 4.22–5.81)
RDW: 15.5 % (ref 11.5–15.5)
WBC: 9.6 K/uL (ref 4.0–10.5)
nRBC: 0 % (ref 0.0–0.2)

## 2024-08-07 LAB — COMPREHENSIVE METABOLIC PANEL WITH GFR
ALT: 9 U/L (ref 0–44)
AST: 15 U/L (ref 15–41)
Albumin: 3 g/dL — ABNORMAL LOW (ref 3.5–5.0)
Alkaline Phosphatase: 67 U/L (ref 38–126)
Anion gap: 6 (ref 5–15)
BUN: 9 mg/dL (ref 6–20)
CO2: 28 mmol/L (ref 22–32)
Calcium: 8.6 mg/dL — ABNORMAL LOW (ref 8.9–10.3)
Chloride: 105 mmol/L (ref 98–111)
Creatinine, Ser: 0.91 mg/dL (ref 0.61–1.24)
GFR, Estimated: >60 mL/min (ref >60)
Glucose, Bld: 91 mg/dL (ref 70–99)
Potassium: 3.7 mmol/L (ref 3.5–5.1)
Sodium: 139 mmol/L (ref 135–145)
Total Bilirubin: 0.4 mg/dL (ref 0.0–1.2)
Total Protein: 7.2 g/dL (ref 6.5–8.1)

## 2024-08-07 LAB — BLOOD CULTURE ID PANEL (REFLEXED) - BCID2

## 2024-08-07 MED ORDER — METHADONE HCL 10 MG/ML PO CONC
120.0000 mg | Freq: Every day | ORAL | Status: DC
  Administered 2024-08-07 – 2024-08-19 (×13): 120 mg via ORAL
  Filled 2024-08-07 (×14): qty 15

## 2024-08-07 MED ORDER — CIPROFLOXACIN HCL 500 MG PO TABS
500.0000 mg | ORAL_TABLET | Freq: Two times a day (BID) | ORAL | Status: DC
  Administered 2024-08-07 – 2024-08-12 (×8): 500 mg via ORAL
  Filled 2024-08-07 (×11): qty 1

## 2024-08-07 NOTE — Progress Notes (Signed)
 PHARMACY - PHYSICIAN COMMUNICATION CRITICAL VALUE ALERT - BLOOD CULTURE IDENTIFICATION (BCID)  [name] is an 43 y.o. male who presented to [facility] on 08/06/2024 with a chief complaint of wound infection.  Assessment:  1/4 GPC, BCID = MRSA  Name of physician (or Provider) Contacted: Dr. Awanda  Current antibiotics: Vancomycin  and Cefepime   Changes to prescribed antibiotics recommended:  Patient is on recommended antibiotics - No changes needed  Results for orders placed or performed during the hospital encounter of 08/06/24  Blood Culture ID Panel (Reflexed) (Collected: 08/06/2024 11:33 AM)  Result Value Ref Range   Enterococcus faecalis NOT DETECTED NOT DETECTED   Enterococcus Faecium NOT DETECTED NOT DETECTED   Listeria monocytogenes NOT DETECTED NOT DETECTED   Staphylococcus species DETECTED (A) NOT DETECTED   Staphylococcus aureus (BCID) DETECTED (A) NOT DETECTED   Staphylococcus epidermidis NOT DETECTED NOT DETECTED   Staphylococcus lugdunensis NOT DETECTED NOT DETECTED   Streptococcus species NOT DETECTED NOT DETECTED   Streptococcus agalactiae NOT DETECTED NOT DETECTED   Streptococcus pneumoniae NOT DETECTED NOT DETECTED   Streptococcus pyogenes NOT DETECTED NOT DETECTED   A.calcoaceticus-baumannii NOT DETECTED NOT DETECTED   Bacteroides fragilis NOT DETECTED NOT DETECTED   Enterobacterales NOT DETECTED NOT DETECTED   Enterobacter cloacae complex NOT DETECTED NOT DETECTED   Escherichia coli NOT DETECTED NOT DETECTED   Klebsiella aerogenes NOT DETECTED NOT DETECTED   Klebsiella oxytoca NOT DETECTED NOT DETECTED   Klebsiella pneumoniae NOT DETECTED NOT DETECTED   Proteus species NOT DETECTED NOT DETECTED   Salmonella species NOT DETECTED NOT DETECTED   Serratia marcescens NOT DETECTED NOT DETECTED   Haemophilus influenzae NOT DETECTED NOT DETECTED   Neisseria meningitidis NOT DETECTED NOT DETECTED   Pseudomonas aeruginosa NOT DETECTED NOT DETECTED   Stenotrophomonas  maltophilia NOT DETECTED NOT DETECTED   Candida albicans NOT DETECTED NOT DETECTED   Candida auris NOT DETECTED NOT DETECTED   Candida glabrata NOT DETECTED NOT DETECTED   Candida krusei NOT DETECTED NOT DETECTED   Candida parapsilosis NOT DETECTED NOT DETECTED   Candida tropicalis NOT DETECTED NOT DETECTED   Cryptococcus neoformans/gattii NOT DETECTED NOT DETECTED   Meth resistant mecA/C and MREJ DETECTED (A) NOT DETECTED    Kayla JULIANNA Blew 08/07/2024  7:15 AM
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
## 2024-08-07 NOTE — Progress Notes (Signed)
  PROGRESS NOTE    [name]  FMW:[id number] DOB: [birth year] DOA: 08/06/2024 PCP: [name] SQUIBB, PA  131A/131A-AA  LOS: 1 day   Brief hospital course:   Assessment & Plan: [name] is a 43 y.o. male with medical history significant of IV drug use, opioid withdrawal, delusional.  Diabetes, kidney disease, splenectomy presenting with worsening wounds.   Patient had recent prolonged course of treatment for wound infection and bacteremia starting in June.  He was treated on and off for his infection since the beginning of June throughout that month in part due to recurrent hospitalizations in the setting of leaving AMA.  Ultimately he completed a course of p.o. linezolid  outpatient as well as some other antibiotics.  Was able to get a TTE done which was negative but did not get a TEE done.  Did have negative cultures on June 6 and repeat negative cultures on June 21.   Was stable at recent infectious disease follow-up outpatient on 8/1.  At that time he was off antibiotics.  He was prescribed as needed Augmentin  for fevers in the setting of asplenia.   Worsening pain, redness, appearance of fluid from the wound for the past couple weeks.  Increase in swelling of the left wound in the last 2 days.  Patient has noted worsening changes to his skin ulcerations with worsening swelling, erythema, pain.   MRSA bacteremia --1/4 blood cx pos --cont IV vanc --ID consult  Worsening chronic LE ulcers --cont Vanc and Cipro  for now --wound care per order  Substance use History of opioid withdrawal --UDS pos for cocaine and methadone --cont methadone   DVT prophylaxis: Lovenox  SQ Code Status: Full code  Family Communication:  Level of care: Telemetry Medical Dispo:   The patient is from: home Anticipated d/c is to: home Anticipated d/c date is: > 3 days   Subjective and Interval History:  Pt reported normal oral intake, urination and BM.   Objective: Vitals:   08/06/24 1922  08/07/24 0410 08/07/24 0735 08/07/24 1458  BP: 130/84 127/80 133/82 (!) 125/99  Pulse: 77 68 65 61  Resp: 18 18 16 16   Temp: 98.2 F (36.8 C) 98.2 F (36.8 C) 98.6 F (37 C) 98.4 F (36.9 C)  TempSrc: Oral Oral Oral   SpO2: 98% 100% 100% 99%  Weight:      Height:        Intake/Output Summary (Last 24 hours) at 08/07/2024 1840 Last data filed at 08/07/2024 1332 Gross per 24 hour  Intake 700.06 ml  Output --  Net 700.06 ml   Filed Weights   08/06/24 1105  Weight: 95.3 kg    Examination:   Constitutional: NAD, AAOx3 HEENT: conjunctivae and lids normal, EOMI CV: No cyanosis.   RESP: normal respiratory effort, on RA Neuro: II - XII grossly intact.   Psych: Normal mood and affect.     Data Reviewed: I have personally reviewed labs and imaging studies  Time spent: 50 minutes  Ellouise Haber, MD [facility] If 7PM-7AM, please contact night-coverage 08/07/2024, 6:40 PM

## 2024-08-08 DIAGNOSIS — B9562 Methicillin resistant Staphylococcus aureus infection as the cause of diseases classified elsewhere: Secondary | ICD-10-CM | POA: Diagnosis not present

## 2024-08-08 DIAGNOSIS — T148XXA Other injury of unspecified body region, initial encounter: Secondary | ICD-10-CM | POA: Diagnosis not present

## 2024-08-08 DIAGNOSIS — R7881 Bacteremia: Secondary | ICD-10-CM | POA: Diagnosis not present

## 2024-08-08 DIAGNOSIS — F1490 Cocaine use, unspecified, uncomplicated: Secondary | ICD-10-CM | POA: Diagnosis not present

## 2024-08-08 DIAGNOSIS — L089 Local infection of the skin and subcutaneous tissue, unspecified: Secondary | ICD-10-CM | POA: Diagnosis not present

## 2024-08-08 DIAGNOSIS — F1990 Other psychoactive substance use, unspecified, uncomplicated: Secondary | ICD-10-CM | POA: Diagnosis not present

## 2024-08-08 LAB — BASIC METABOLIC PANEL WITH GFR
Anion gap: 9 (ref 5–15)
BUN: 7 mg/dL (ref 6–20)
CO2: 29 mmol/L (ref 22–32)
Calcium: 8.5 mg/dL — ABNORMAL LOW (ref 8.9–10.3)
Chloride: 99 mmol/L (ref 98–111)
Creatinine, Ser: 1.06 mg/dL (ref 0.61–1.24)
GFR, Estimated: >60 mL/min (ref >60)
Glucose, Bld: 79 mg/dL (ref 70–99)
Potassium: 3.8 mmol/L (ref 3.5–5.1)
Sodium: 137 mmol/L (ref 135–145)

## 2024-08-08 LAB — CBC
HCT: 33.5 % — ABNORMAL LOW (ref 39.0–52.0)
Hemoglobin: 10.8 g/dL — ABNORMAL LOW (ref 13.0–17.0)
MCH: 27.8 pg (ref 26.0–34.0)
MCHC: 32.2 g/dL (ref 30.0–36.0)
MCV: 86.1 fL (ref 80.0–100.0)
Platelets: 519 K/uL — ABNORMAL HIGH (ref 150–400)
RBC: 3.89 MIL/uL — ABNORMAL LOW (ref 4.22–5.81)
RDW: 15.2 % (ref 11.5–15.5)
WBC: 7.9 K/uL (ref 4.0–10.5)
nRBC: 0 % (ref 0.0–0.2)

## 2024-08-08 NOTE — Progress Notes (Signed)
 Date of Admission:  08/06/2024      ID: [name] is a 43 y.o. male  Principal Problem:   Wound infection Active Problems:   History of intravenous drug use in remission   History of MRSA infection   Polysubstance abuse (HCC)   IVDU (intravenous drug user)    Subjective: Patient states he is doing okay Head is covered with blankets  Medications:   ciprofloxacin   500 mg Oral BID   enoxaparin  (LOVENOX ) injection  40 mg Subcutaneous Q24H   methadone  120 mg Oral Daily   sodium chloride  flush  3 mL Intravenous Q12H    Objective: Vital signs in last 24 hours: Patient Vitals for the past 24 hrs:  BP Temp Temp src Pulse Resp SpO2  08/08/24 1550 (!) 136/93 98.5 F (36.9 C) -- 61 18 99 %  08/08/24 0822 118/68 98.1 F (36.7 C) -- (!) 54 16 100 %  08/08/24 0403 124/76 98.7 F (37.1 C) -- 64 18 97 %  08/07/24 1946 136/87 97.9 F (36.6 C) Oral 60 18 96 %       PHYSICAL EXAM:  General: Alert, cooperative, no distress, appears stated age.  Lungs: Clear to auscultation bilaterally. No Wheezing or Rhonchi. No rales. Heart: Regular rate and rhythm, no murmur, rub or gallop. Abdomen: Soft, non-tender,not distended. Bowel sounds normal. No masses Extremities: Bilateral leg wounds Culture taken from the left wound skin: No rashes or lesions. Or bruising Lymph: Cervical, supraclavicular normal. Neurologic: Grossly non-focal  Lab Results    Latest Ref Rng & Units 08/08/2024   11:47 AM 08/07/2024    2:59 AM 08/06/2024   11:31 AM  CBC  WBC 4.0 - 10.5 K/uL 7.9  9.6  11.4   Hemoglobin 13.0 - 17.0 g/dL 89.1  89.9  89.7   Hematocrit 39.0 - 52.0 % 33.5  31.9  32.5   Platelets 150 - 400 K/uL 519  510  527        Latest Ref Rng & Units 08/08/2024   11:47 AM 08/07/2024    2:59 AM 08/06/2024   11:31 AM  CMP  Glucose 70 - 99 mg/dL 79  91  899   BUN 6 - 20 mg/dL 7  9  14    Creatinine 0.61 - 1.24 mg/dL 8.93  9.08  8.91   Sodium 135 - 145 mmol/L 137  139  137   Potassium 3.5 - 5.1  mmol/L 3.8  3.7  4.0   Chloride 98 - 111 mmol/L 99  105  98   CO2 22 - 32 mmol/L 29  28  26    Calcium  8.9 - 10.3 mg/dL 8.5  8.6  8.9   Total Protein 6.5 - 8.1 g/dL  7.2  8.7   Total Bilirubin 0.0 - 1.2 mg/dL  0.4  0.5   Alkaline Phos 38 - 126 U/L  67  75   AST 15 - 41 U/L  15  15   ALT 0 - 44 U/L  9  10       Microbiology: Blood culture from 08/06/2024 1 set positive for MRSA    Assessment/Plan: 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  Also on vancomycin    IVDA  Polysubstance use including fentanyl and cocaine Patient states currently He is on methadone  HIV nonreactive June 2025 hep C also was nonreactive then   Discussed the management with the patient and the care team.
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
## 2024-08-08 NOTE — Progress Notes (Signed)
   Transesophageal Echocardiogram Consent Note  [facility] Cardiology has been requested to perform a transesophageal echocardiogram on [name] for MRSA bacteremia, rule out endocarditis.  After careful review of history and examination, the risks and benefits of transesophageal echocardiogram have been explained including risks of esophageal damage, perforation (1:10,000 risk), bleeding, pharyngeal hematoma as well as other potential complications associated with conscious sedation including aspiration, arrhythmia, respiratory failure and death. Alternatives to treatment were discussed, questions were answered. Patient is willing to proceed, he did not have any questions. Will schedule TEE for tomorrow 08/09/2024 at 12:30 PM.   Signed: Danita Bloch, PA-C  08/08/2024 3:59 PM

## 2024-08-08 NOTE — Plan of Care (Signed)

## 2024-08-08 NOTE — Progress Notes (Addendum)
 Nurse offered patient multiple times to have his wound dressing changed per orders, however, despite nurses' best efforts and re-education patient still refused wound care.   Patient also notified nurse that he has been eating and drinking all night despite being NPO for possible TEE scheduled for 08/08/24. Charge Nurse was notified of current updates and will notify on coming nurse. Plan of care continues.

## 2024-08-09 ENCOUNTER — Inpatient Hospital Stay: Admitting: Certified Registered"

## 2024-08-09 ENCOUNTER — Encounter
Admission: EM | Discharge status: Against Medical Advice | Payer: Self-pay | Source: Home / Self Care | Attending: Family Medicine

## 2024-08-09 ENCOUNTER — Inpatient Hospital Stay
Admit: 2024-08-09 | Discharge: 2024-08-09 | Disposition: A | Discharge status: Home / Self Care | Attending: Student | Admitting: Student

## 2024-08-09 DIAGNOSIS — R7881 Bacteremia: Secondary | ICD-10-CM

## 2024-08-09 DIAGNOSIS — B9562 Methicillin resistant Staphylococcus aureus infection as the cause of diseases classified elsewhere: Secondary | ICD-10-CM | POA: Diagnosis not present

## 2024-08-09 DIAGNOSIS — L089 Local infection of the skin and subcutaneous tissue, unspecified: Secondary | ICD-10-CM | POA: Diagnosis not present

## 2024-08-09 DIAGNOSIS — F1990 Other psychoactive substance use, unspecified, uncomplicated: Secondary | ICD-10-CM | POA: Diagnosis not present

## 2024-08-09 DIAGNOSIS — T148XXA Other injury of unspecified body region, initial encounter: Secondary | ICD-10-CM | POA: Diagnosis not present

## 2024-08-09 DIAGNOSIS — F1490 Cocaine use, unspecified, uncomplicated: Secondary | ICD-10-CM | POA: Diagnosis not present

## 2024-08-09 HISTORY — PX: TEE WITHOUT CARDIOVERSION: SHX5443

## 2024-08-09 LAB — CREATININE, SERUM
Creatinine, Ser: 1.04 mg/dL (ref 0.61–1.24)
GFR, Estimated: >60 mL/min (ref >60)

## 2024-08-09 LAB — VANCOMYCIN, TROUGH: Vancomycin Tr: 18 ug/mL (ref 15–20)

## 2024-08-09 SURGERY — ECHOCARDIOGRAM, TRANSESOPHAGEAL
Anesthesia: General

## 2024-08-09 MED ORDER — PROPOFOL 500 MG/50ML IV EMUL
INTRAVENOUS | Status: DC | PRN
  Administered 2024-08-09: 175 ug/kg/min via INTRAVENOUS

## 2024-08-09 MED ORDER — EPHEDRINE SULFATE (PRESSORS) 50 MG/ML IJ SOLN
INTRAMUSCULAR | Status: DC | PRN
  Administered 2024-08-09: 5 mg via INTRAVENOUS

## 2024-08-09 MED ORDER — SODIUM CHLORIDE 0.9 % IV SOLN: INTRAVENOUS | Status: DC

## 2024-08-09 MED ORDER — DEXMEDETOMIDINE HCL IN NACL 200 MCG/50ML IV SOLN
INTRAVENOUS | Status: DC | PRN
  Administered 2024-08-09: 8 ug via INTRAVENOUS

## 2024-08-09 MED ORDER — PROPOFOL 10 MG/ML IV BOLUS
INTRAVENOUS | Status: DC | PRN
  Administered 2024-08-09: 40 mg via INTRAVENOUS
  Administered 2024-08-09: 70 mg via INTRAVENOUS

## 2024-08-09 NOTE — Transfer of Care (Signed)
 Immediate Anesthesia Transfer of Care Note  Patient: [name]  Procedure(s) Performed: ECHOCARDIOGRAM, TRANSESOPHAGEAL  Patient Location:[facility]   Anesthesia Type:General  Level of Consciousness: drowsy and patient cooperative  Airway & Oxygen Therapy: Patient Spontanous Breathing and Patient connected to face mask oxygen  Post-op Assessment: Report given to RN  Post vital signs: Reviewed and stable  Last Vitals:  Vitals Value Taken Time  BP 83/69 08/09/24 13:13  Temp    Pulse 51 08/09/24 13:16  Resp 20 08/09/24 13:16  SpO2 98 % 08/09/24 13:16    Last Pain:  Vitals:   08/09/24 1152  TempSrc: Oral  PainSc: 0-No pain         Complications: No notable events documented.

## 2024-08-09 NOTE — Anesthesia Preprocedure Evaluation (Addendum)
 Anesthesia Evaluation  Patient identified by MRN, date of birth, ID band Patient awake    Reviewed: Allergy & Precautions, NPO status , Patient's Chart, lab work & pertinent test results  Airway Mallampati: III  TM Distance: >3 FB Neck ROM: full  Mouth opening: Limited Mouth Opening  Dental  (+) Teeth Intact   Pulmonary neg pulmonary ROS, Patient abstained from smoking., former smoker   Pulmonary exam normal  + decreased breath sounds      Cardiovascular Exercise Tolerance: Good negative cardio ROS Normal cardiovascular exam Rhythm:Regular Rate:Normal     Neuro/Psych negative neurological ROS  negative psych ROS   GI/Hepatic negative GI ROS, Neg liver ROS,,,(+)     substance abuse  cocaine use and IV drug use  Endo/Other  negative endocrine ROSdiabetes, Poorly Controlled    Renal/GU Renal InsufficiencyRenal diseasenegative Renal ROS  negative genitourinary   Musculoskeletal negative musculoskeletal ROS (+)  narcotic dependent  Abdominal Normal abdominal exam  (+)   Peds negative pediatric ROS (+)  Hematology negative hematology ROS (+)   Anesthesia Other Findings Past Medical History: No date: Allergy 03/2019: Opioid abuse (HCC)     Comment:  on-going daily use of IV Fentanyl 04/12/2024: Sepsis (HCC)  Past Surgical History: No date: NO PAST SURGERIES No date: SPLENECTOMY  BMI    Body Mass Index: 27.71 kg/m      Reproductive/Obstetrics negative OB ROS                              Anesthesia Physical Anesthesia Plan  ASA: 3  Anesthesia Plan: General   Post-op Pain Management:    Induction: Intravenous  PONV Risk Score and Plan: Propofol infusion and TIVA  Airway Management Planned: Natural Airway and Nasal Cannula  Additional Equipment:   Intra-op Plan:   Post-operative Plan:   Informed Consent: I have reviewed the patients History and Physical, chart, labs  and discussed the procedure including the risks, benefits and alternatives for the proposed anesthesia with the patient or authorized representative who has indicated his/her understanding and acceptance.     Dental Advisory Given  Plan Discussed with: CRNA  Anesthesia Plan Comments:         Anesthesia Quick Evaluation

## 2024-08-09 NOTE — Plan of Care (Signed)
  Problem: Education: Goal: Knowledge of General Education information will improve Description: Including pain rating scale, medication(s)/side effects and non-pharmacologic comfort measures Outcome: Progressing   Problem: Clinical Measurements: Goal: Ability to maintain clinical measurements within normal limits will improve Outcome: Progressing   Problem: Pain Managment: Goal: General experience of comfort will improve and/or be controlled Outcome: Progressing   Problem: Skin Integrity: Goal: Risk for impaired skin integrity will decrease Outcome: Progressing

## 2024-08-09 NOTE — Progress Notes (Signed)
  Patient refused wound dressing and wound measurement, stating: I don't want wound to be dressed and measured. Explained risks of refusal. MD notified.

## 2024-08-09 NOTE — Consult Note (Signed)
 Pharmacy Antibiotic Note  ASSESSMENT: 43 y.o. 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  Progressively worsening swelling with subjective chills/fevers. Wound infection previously grew E. Cloacae and MRSA. Patient had discharged with linezolid  and ciprofloxacin  from 04/2024 hospitalization. Pharmacy has been consulted to manage vancomycin  dosing.  Today, 08/09/2024 Day #4 antibiotics - currently vancomycin  and ciprofloxacin  Renal: SCr stable 1.04 WBC WNL Afebrile 9/28 Blood cx: 3/4 GPC, MRSA 10/1 repeat Blood cx: 9/30 leg wound cx: pending (gram stain GNR) Previous wound cultures with E cloacae (one culture organism resistant to cefepime , susceptible to cefepime  on a 2nd culture) TEE planned for 10/1  Vancomycin  levels Patient refuse vancomycin  peak at 02:00 10/1 He did allow trough to be drawn 10/1 at 0947 = 18 mcg/mL (this was prior to 6th 1500mg  IV q12h dose) Previous dose given 9/30 at 2:157  PLAN: Based on true vancomycin  trough on 10/1, continued vancomycin  1500mg  IV q12h Note - no AUC dosing calculated as patient refused peak Follow renal function Follow repeat blood culture Follow TEE result Await final plan for treatment  Patient measurements: Height: 6' 1 (185.4 cm) Weight: 95.3 kg (210 lb) IBW/kg (Calculated) : 79.9  Vital signs: Temp: 97.5 F (36.4 C) (10/01 0900) Temp Source: Axillary (10/01 0344) BP: 125/63 (10/01 0900) Pulse Rate: 62 (10/01 0900) Recent Labs  Lab 08/06/24 1131 08/07/24 0259 08/08/24 1147 08/09/24 0947  WBC 11.4* 9.6 7.9  --   CREATININE 1.08 0.91 1.06 1.04   Estimated Creatinine Clearance: 104.6 mL/min (by C-G formula based on SCr of 1.04 mg/dL).  Allergies: No Known Allergies  Antimicrobials this admission: Zosyn  9/28 x 1 Cefepime  9/28 >>9/29 Vancomycin  9/28 >> Metronidazole  9/28 >>9/29 Ciprofloxacin   9/29 >>   Thank you for allowing pharmacy to be a part of this patient's care.  Taveon Enyeart, PharmD, BCPS, BCIDP Work Cell: [phone] 08/09/2024 11:09 AM

## 2024-08-09 NOTE — Care Management Important Message (Signed)
 Important Message  Patient Details  Name: [name] MRN: [id number] Date of Birth: August 18, 1981   Important Message Given:  Yes - Medicare IM     Clemon Devaul W, CMA 08/09/2024, 11:17 AM

## 2024-08-09 NOTE — Progress Notes (Signed)
 Date of Admission:  08/06/2024      ID: [name] is a 43 y.o. male  Principal Problem:   Wound infection Active Problems:   History of intravenous drug use in remission   History of MRSA infection   Polysubstance abuse (HCC)   IVDU (intravenous drug user)    Subjective: Back from TEE Does not want to ne bothered  Medications:   [MAR Hold] ciprofloxacin   500 mg Oral BID   [MAR Hold] enoxaparin  (LOVENOX ) injection  40 mg Subcutaneous Q24H   [MAR Hold] methadone  120 mg Oral Daily   [MAR Hold] sodium chloride  flush  3 mL Intravenous Q12H    Objective: Vital signs in last 24 hours: Patient Vitals for the past 24 hrs:  BP Temp Temp src Pulse Resp SpO2  08/09/24 1152 (!) 135/90 98.7 F (37.1 C) Oral 61 (!) 7 93 %  08/09/24 0900 125/63 (!) 97.5 F (36.4 C) -- 62 17 96 %  08/09/24 0344 128/87 97.6 F (36.4 C) Axillary 64 16 97 %  08/08/24 1940 117/84 98.3 F (36.8 C) -- (!) 58 15 95 %  08/08/24 1550 (!) 136/93 98.5 F (36.9 C) -- 61 18 99 %       PHYSICAL EXAM:  General under the blanket but did  respond to questions  Lungs: Clear to auscultation bilaterally. No Wheezing or Rhonchi. No rales. Heart: Regular rate and rhythm, no murmur, rub or gallop. Abdomen: Soft, non-tender,not distended. Bowel sounds normal. No masses Extremities: Bilateral leg wounds Improving- rt is much better Culture taken from the left wound skin: No rashes or lesions. Or bruising Lymph: Cervical, supraclavicular normal. Neurologic: Grossly non-focal  Lab Results    Latest Ref Rng & Units 08/08/2024   11:47 AM 08/07/2024    2:59 AM 08/06/2024   11:31 AM  CBC  WBC 4.0 - 10.5 K/uL 7.9  9.6  11.4   Hemoglobin 13.0 - 17.0 g/dL 89.1  89.9  89.7   Hematocrit 39.0 - 52.0 % 33.5  31.9  32.5   Platelets 150 - 400 K/uL 519  510  527        Latest Ref Rng & Units 08/09/2024    9:47 AM 08/08/2024   11:47 AM 08/07/2024    2:59 AM  CMP  Glucose 70 - 99 mg/dL  79  91   BUN 6 - 20 mg/dL  7  9    Creatinine 9.38 - 1.24 mg/dL 8.95  8.93  9.08   Sodium 135 - 145 mmol/L  137  139   Potassium 3.5 - 5.1 mmol/L  3.8  3.7   Chloride 98 - 111 mmol/L  99  105   CO2 22 - 32 mmol/L  29  28   Calcium  8.9 - 10.3 mg/dL  8.5  8.6   Total Protein 6.5 - 8.1 g/dL   7.2   Total Bilirubin 0.0 - 1.2 mg/dL   0.4   Alkaline Phos 38 - 126 U/L   67   AST 15 - 41 U/L   15   ALT 0 - 44 U/L   9       Microbiology: Blood culture from 08/06/2024 1 set positive for MRSA 08/09/24 [facility] sent   Assessment/Plan: MRSA bacteremia, source left leg wound very likley This is recurrent Patient is currently on vancomycin  TEE neg Repeat blood culture sent Skin and soft tissue infection with chronic leg wounds Patient has had skin popping in the past but this looks  like fentanyl laced with xylazine wounds Culture has been sent Patient is currently on vanco and cipro  This time Will need AntimRSA coverage by IV antibiotic for 4 weeks May be able to do weekly dalba infusion in the day surgery after initial 2 weeks of daily IV He had been on linezolid  before when he left AMA Would avoid linezolid  with fentanyl use    IVDA Polysubstance use including fentanyl and cocaine Cannot be sent home on PICC Patient states currently He is on methadone  HIV nonreactive June 2025 hep C also was nonreactive then   Discussed the management with the patient and the care team.

## 2024-08-09 NOTE — Progress Notes (Signed)
 Pt has vanc peak level scheduled to be drawn at 0200 but pt refused lab draw this morning. [facility] called and informed of pts refusal.

## 2024-08-09 NOTE — Anesthesia Postprocedure Evaluation (Signed)
 Anesthesia Post Note  Patient: [name]  Procedure(s) Performed: ECHOCARDIOGRAM, TRANSESOPHAGEAL  Patient location during evaluation: PACU Anesthesia Type: [name] Level of consciousness: sedated Pain management: pain level controlled Vital Signs Assessment: post-procedure vital signs reviewed and stable Respiratory status: spontaneous breathing Cardiovascular status: stable Anesthetic complications: no   No notable events documented.   Last Vitals:  Vitals:   08/09/24 1319 08/09/24 1330  BP: (!) 99/49 101/66  Pulse: (!) 54 66  Resp: 13 13  Temp: (!) 36.3 C   SpO2: 98% 98%    Last Pain:  Vitals:   08/09/24 1330  TempSrc:   PainSc: 0-No pain                 VAN STAVEREN,[place] Perra

## 2024-08-09 NOTE — Progress Notes (Signed)
 Patient stable. Bedside handoff given to [facility].

## 2024-08-09 NOTE — Anesthesia Procedure Notes (Signed)
 Procedure Name: General with mask airway Date/Time: 08/09/2024 12:56 PM  Performed by: Ledora Duncan, CRNAPre-anesthesia Checklist: Patient identified, Emergency Drugs available, Suction available and Patient being monitored Patient Re-evaluated:Patient Re-evaluated prior to induction Oxygen Delivery Method: Simple face mask Induction Type: IV induction Placement Confirmation: positive ETCO2 and breath sounds checked- equal and bilateral Dental Injury: Teeth and Oropharynx as per pre-operative assessment

## 2024-08-09 NOTE — Progress Notes (Signed)
*  PRELIMINARY RESULTS* Echocardiogram Echocardiogram Transesophageal has been performed.  [name] 08/09/2024, 1:13 PM
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
## 2024-08-10 ENCOUNTER — Encounter: Payer: Self-pay | Admitting: Internal Medicine

## 2024-08-10 DIAGNOSIS — T148XXA Other injury of unspecified body region, initial encounter: Secondary | ICD-10-CM | POA: Diagnosis not present

## 2024-08-10 DIAGNOSIS — L089 Local infection of the skin and subcutaneous tissue, unspecified: Secondary | ICD-10-CM | POA: Diagnosis not present

## 2024-08-10 LAB — CULTURE, BLOOD (ROUTINE X 2)
Culture  Setup Time: NO GROWTH
Report Status: NO GROWTH — AB
Special Requests: ADEQUATE
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
## 2024-08-10 NOTE — Progress Notes (Signed)
 1254 120mg  PO of methadone given at bedside. 30mg  wasted witnessed by [employer]
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
## 2024-08-10 NOTE — Plan of Care (Signed)
  Problem: Education: Goal: Knowledge of General Education information will improve Description: Including pain rating scale, medication(s)/side effects and non-pharmacologic comfort measures Outcome: Not Progressing   Problem: Clinical Measurements: Goal: Will remain free from infection Outcome: Not Progressing Goal: Respiratory complications will improve Outcome: Progressing Goal: Cardiovascular complication will be avoided Outcome: Progressing   Problem: Nutrition: Goal: Adequate nutrition will be maintained Outcome: Progressing

## 2024-08-10 NOTE — Plan of Care (Signed)
  Problem: Education: Goal: Knowledge of General Education information will improve Description: Including pain rating scale, medication(s)/side effects and non-pharmacologic comfort measures Outcome: Not Progressing   Problem: Health Behavior/Discharge Planning: Goal: Ability to manage health-related needs will improve Outcome: Not Progressing   Problem: Clinical Measurements: Goal: Ability to maintain clinical measurements within normal limits will improve Outcome: Not Progressing   Problem: Activity: Goal: Risk for activity intolerance will decrease Outcome: Not Progressing   Problem: Nutrition: Goal: Adequate nutrition will be maintained Outcome: Not Progressing   Problem: Coping: Goal: Level of anxiety will decrease Outcome: Not Progressing   Problem: Elimination: Goal: Will not experience complications related to bowel motility Outcome: Not Progressing   Problem: Skin Integrity: Goal: Risk for impaired skin integrity will decrease Outcome: Not Progressing

## 2024-08-11 DIAGNOSIS — F1490 Cocaine use, unspecified, uncomplicated: Secondary | ICD-10-CM | POA: Diagnosis not present

## 2024-08-11 DIAGNOSIS — R7881 Bacteremia: Secondary | ICD-10-CM | POA: Diagnosis not present

## 2024-08-11 DIAGNOSIS — B9562 Methicillin resistant Staphylococcus aureus infection as the cause of diseases classified elsewhere: Secondary | ICD-10-CM | POA: Diagnosis not present

## 2024-08-11 DIAGNOSIS — F1990 Other psychoactive substance use, unspecified, uncomplicated: Secondary | ICD-10-CM | POA: Diagnosis not present

## 2024-08-11 DIAGNOSIS — T148XXA Other injury of unspecified body region, initial encounter: Secondary | ICD-10-CM | POA: Diagnosis not present

## 2024-08-11 DIAGNOSIS — L089 Local infection of the skin and subcutaneous tissue, unspecified: Secondary | ICD-10-CM | POA: Diagnosis not present

## 2024-08-11 NOTE — TOC Progression Note (Signed)
 Transition of Care [facility]) - Progression Note    Patient Details  Name: [name] MRN: [id number] Date of Birth: 1981-05-02  [facility] CM/SW Contact  Marinda Cooks, RN Phone Number: 08/11/2024, 2:25 PM  Clinical Narrative:     This CM updated by medical team that  pt will remain in hospital and receive his IV antibiotic course due to his active IV drug use . TOC will cont to follow dc planning / care coordination and update as applicable.      Expected Discharge Plan and Services    Social Drivers of Health (SDOH) Interventions SDOH Screenings   Food Insecurity: No Food Insecurity (08/06/2024)  Housing: Low Risk  (08/06/2024)  Transportation Needs: No Transportation Needs (08/06/2024)  Utilities: Not At Risk (08/06/2024)  Depression (PHQ2-9): Low Risk  (10/27/2023)  Financial Resource Strain: Low Risk  (06/09/2024)   Received from [facility]  Social Connections: Socially Isolated (05/05/2024)  Tobacco Use: Low Risk  (08/06/2024)  Recent Concern: Tobacco Use - Medium Risk (06/09/2024)   Received from [facility]    Readmission Risk Interventions     No data to display

## 2024-08-11 NOTE — Plan of Care (Signed)

## 2024-08-11 NOTE — Progress Notes (Signed)
 1645 patient did not want assessment and wound care to BLE states it was already completed today patient alert x4 on room air
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
## 2024-08-11 NOTE — Progress Notes (Signed)
 Despite Nurses' best efforts, patient Refused wound care dressing change. Care plan continues.

## 2024-08-11 NOTE — Progress Notes (Signed)
 Date of Admission:  08/06/2024      ID: [name] is a 43 y.o. male  Principal Problem:   Wound infection Active Problems:   History of intravenous drug use in remission   History of MRSA infection   Polysubstance abuse (HCC)   IVDU (intravenous drug user)   MRSA bacteremia    Subjective: In bed under covers Does not want to talk  Medications:   ciprofloxacin   500 mg Oral BID   enoxaparin  (LOVENOX ) injection  40 mg Subcutaneous Q24H   methadone  120 mg Oral Daily   sodium chloride  flush  3 mL Intravenous Q12H    Objective: Vital signs in last 24 hours: Patient Vitals for the past 24 hrs:  BP Temp Pulse Resp SpO2  08/11/24 0357 121/76 98.9 F (37.2 C) (!) 53 17 99 %  08/10/24 1523 (!) 100/59 98.4 F (36.9 C) (!) 57 16 99 %       PHYSICAL EXAM:  General under the blanket b Lab Results    Latest Ref Rng & Units 08/08/2024   11:47 AM 08/07/2024    2:59 AM 08/06/2024   11:31 AM  CBC  WBC 4.0 - 10.5 K/uL 7.9  9.6  11.4   Hemoglobin 13.0 - 17.0 g/dL 89.1  89.9  89.7   Hematocrit 39.0 - 52.0 % 33.5  31.9  32.5   Platelets 150 - 400 K/uL 519  510  527        Latest Ref Rng & Units 08/09/2024    9:47 AM 08/08/2024   11:47 AM 08/07/2024    2:59 AM  CMP  Glucose 70 - 99 mg/dL  79  91   BUN 6 - 20 mg/dL  7  9   Creatinine 9.38 - 1.24 mg/dL 8.95  8.93  9.08   Sodium 135 - 145 mmol/L  137  139   Potassium 3.5 - 5.1 mmol/L  3.8  3.7   Chloride 98 - 111 mmol/L  99  105   CO2 22 - 32 mmol/L  29  28   Calcium  8.9 - 10.3 mg/dL  8.5  8.6   Total Protein 6.5 - 8.1 g/dL   7.2   Total Bilirubin 0.0 - 1.2 mg/dL   0.4   Alkaline Phos 38 - 126 U/L   67   AST 15 - 41 U/L   15   ALT 0 - 44 U/L   9       Microbiology: Blood culture from 08/06/2024 1 set positive for MRSA 08/09/24 BC NG so far   Assessment/Plan: MRSA bacteremia, source left leg wound This is recurrent bacteremia Patient is currently on vancomycin  TEE neg Repeat blood culture neg Skin and soft tissue  infection with chronic leg wounds due to MRSA Patient has had skin popping in the past but this looks like fentanyl laced with xylazine wounds Culture MRSA Patient is currently on vanco and cipro  This time Will need AntimRSA coverage by IV antibiotic for 4 weeks. We will do 2 weeks IV vanco in house until 10/14 and after that can do  weekly dalba infusion in the day surgery X 2 doses after initial 2 weeks of daily IV He had been on linezolid  before when he left AMA Would avoid linezolid  with fentanyl /mehadone use if at all possible.     IVDA Polysubstance use including fentanyl and cocaine Cannot be sent home on PICC Patient states currently He is on methadone  HIV nonreactive June 2025  hep C also was nonreactive then   Discussed the management with the hospitalist ID will not see him this weekend Call if needed

## 2024-08-11 NOTE — Consult Note (Signed)
 Pharmacy Antibiotic Note  ASSESSMENT: 43 y.o. 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  Progressively worsening swelling with subjective chills/fevers. Wound infection previously grew E. Cloacae and MRSA. Patient had discharged with linezolid  and ciprofloxacin  from 04/2024 hospitalization. Pharmacy has been consulted to manage vancomycin  dosing.  Today, 08/11/2024 Day #6 antibiotics - currently vancomycin  and ciprofloxacin  Renal: Patient refused lab this morning- unable to get Scr.  WBC WNL Afebrile 9/28 Blood cx: 3/4 GPC, MRSA 10/1 repeat Blood rk:WHUI TEE  10/1: without evidence of endocarditis   Vancomycin  levels Patient refused vancomycin  peak at 02:00 10/1 He did allow trough to be drawn 10/1 at 0947 = 18 mcg/mL (this was prior to 6th 1500mg  IV q12h dose) Previous dose given 9/30 at 2:157  PLAN: Continue vancomycin  1500mg  IV q12h Follow renal function>> Scr ordered again for 10/3 Continue to follow repeat blood culture Await final plan for treatment from ID  Patient measurements: Height: 6' 1 (185.4 cm) Weight: 95.3 kg (210 lb) IBW/kg (Calculated) : 79.9  Vital signs: Temp: 98.9 F (37.2 C) (10/03 0357) BP: 121/76 (10/03 0357) Pulse Rate: 53 (10/03 0357) Recent Labs  Lab 08/06/24 1131 08/07/24 0259 08/08/24 1147 08/09/24 0947  WBC 11.4* 9.6 7.9  --   CREATININE 1.08 0.91 1.06 1.04   Estimated Creatinine Clearance: 104.6 mL/min (by C-G formula based on SCr of 1.04 mg/dL).  Allergies: No Known Allergies  Antimicrobials this admission: Zosyn  9/28 x 1 Cefepime  9/28 >>9/29 Vancomycin  9/28 >> Metronidazole  9/28 >>9/29 Ciprofloxacin  9/29 >>   Thank you for allowing pharmacy to be a part of this patient's care.  Estill CHRISTELLA Lutes, PharmD, BCPS Clinical Pharmacist 08/11/2024 10:06 AM

## 2024-08-11 NOTE — Plan of Care (Signed)
  Problem: Education: Goal: Knowledge of General Education information will improve Description: Including pain rating scale, medication(s)/side effects and non-pharmacologic comfort measures Outcome: Progressing   Problem: Clinical Measurements: Goal: Diagnostic test results will improve Outcome: Progressing Goal: Respiratory complications will improve Outcome: Progressing Goal: Cardiovascular complication will be avoided Outcome: Progressing   Problem: Nutrition: Goal: Adequate nutrition will be maintained Outcome: Progressing   Problem: Coping: Goal: Level of anxiety will decrease Outcome: Progressing   Problem: Elimination: Goal: Will not experience complications related to bowel motility Outcome: Progressing Goal: Will not experience complications related to urinary retention Outcome: Progressing   Problem: Pain Managment: Goal: General experience of comfort will improve and/or be controlled Outcome: Progressing   Problem: Safety: Goal: Ability to remain free from injury will improve Outcome: Progressing   Problem: Health Behavior/Discharge Planning: Goal: Ability to manage health-related needs will improve Outcome: Not Progressing   Problem: Clinical Measurements: Goal: Ability to maintain clinical measurements within normal limits will improve Outcome: Not Progressing Goal: Will remain free from infection Outcome: Not Progressing   Problem: Activity: Goal: Risk for activity intolerance will decrease Outcome: Not Progressing   Problem: Skin Integrity: Goal: Risk for impaired skin integrity will decrease Outcome: Not Progressing

## 2024-08-12 DIAGNOSIS — B9562 Methicillin resistant Staphylococcus aureus infection as the cause of diseases classified elsewhere: Secondary | ICD-10-CM | POA: Diagnosis not present

## 2024-08-12 DIAGNOSIS — T148XXA Other injury of unspecified body region, initial encounter: Secondary | ICD-10-CM | POA: Diagnosis not present

## 2024-08-12 DIAGNOSIS — F1910 Other psychoactive substance abuse, uncomplicated: Secondary | ICD-10-CM | POA: Diagnosis not present

## 2024-08-12 DIAGNOSIS — R7881 Bacteremia: Secondary | ICD-10-CM

## 2024-08-12 LAB — AEROBIC CULTURE W GRAM STAIN (SUPERFICIAL SPECIMEN): Gram Stain: NONE SEEN

## 2024-08-12 LAB — CREATININE, SERUM
Creatinine, Ser: 1.04 mg/dL (ref 0.61–1.24)
GFR, Estimated: >60 mL/min (ref >60)

## 2024-08-12 MED ORDER — SULFAMETHOXAZOLE-TRIMETHOPRIM 800-160 MG PO TABS
1.0000 | ORAL_TABLET | Freq: Two times a day (BID) | ORAL | Status: DC
  Administered 2024-08-12 – 2024-08-19 (×15): 1 via ORAL
  Filled 2024-08-12 (×15): qty 1

## 2024-08-12 NOTE — Plan of Care (Signed)
   Problem: Education: Goal: Knowledge of General Education information will improve Description Including pain rating scale, medication(s)/side effects and non-pharmacologic comfort measures Outcome: Progressing

## 2024-08-12 NOTE — Plan of Care (Signed)
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
## 2024-08-12 NOTE — Progress Notes (Signed)
 Security came to desk and stated patient left floor, went to cafeteria and took drinks without paying. Security instructed patient he needed to pay for drinks, patient gave security money to pay for drinks and security will go back down and pay for drinks and bring patient his change. Nurse will request patient be moved closer to desk as patient is leaving the unit without notifying staff.

## 2024-08-12 NOTE — Plan of Care (Signed)
 Patient refused wound care.

## 2024-08-13 DIAGNOSIS — T148XXA Other injury of unspecified body region, initial encounter: Secondary | ICD-10-CM | POA: Diagnosis not present

## 2024-08-13 DIAGNOSIS — L089 Local infection of the skin and subcutaneous tissue, unspecified: Secondary | ICD-10-CM | POA: Diagnosis not present
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
## 2024-08-13 NOTE — Plan of Care (Signed)

## 2024-08-13 NOTE — Progress Notes (Signed)
 Patient refused dressing change, stated he did it myself'.

## 2024-08-13 NOTE — Progress Notes (Signed)
 Patient unhooked IV and left floor. Returned to room.

## 2024-08-13 NOTE — TOC Progression Note (Signed)
 Transition of Care [facility]) - Progression Note    Patient Details  Name: [name] MRN: [id number] Date of Birth: 09/07/1981  Transition of Care [facility] CM/SW Contact  Marinda Cooks, RN Phone Number: 08/13/2024, 5:13 PM  Clinical Narrative:    No new TOC needs Per medical team  pt will remain in hospital and receive his IV antibiotic course due to his active IV drug use .Pt  last IV dose tentatively 10/16. TOC will cont to follow dc planning / care coordination and update as applicable.    Expected Discharge Plan and Services    TBD   Social Drivers of Health (SDOH) Interventions SDOH Screenings   Food Insecurity: No Food Insecurity (08/06/2024)  Housing: Low Risk  (08/06/2024)  Transportation Needs: No Transportation Needs (08/06/2024)  Utilities: Not At Risk (08/06/2024)  Depression (PHQ2-9): Low Risk  (10/27/2023)  Financial Resource Strain: Low Risk  (06/09/2024)   Received from [facility]  Social Connections: Socially Isolated (05/05/2024)  Tobacco Use: Low Risk  (08/06/2024)  Recent Concern: Tobacco Use - Medium Risk (06/09/2024)   Received from [facility]    Readmission Risk Interventions     No data to display

## 2024-08-13 NOTE — Progress Notes (Signed)
 Patient noted to be off unit, despite being previously instructed by security that he needed to remain in room r/t contact precautions.  Patient educated again on contact precautions and infection control measures given his draining wounds. Patient noted to have unhooked IV medication from PIV prior to leaving unit. Risks associated with this dicussed. Also discussed importance of compliance with care. Patient verbalized understanding of all of the above.  Bedside nurse/care team made aware of education provided.

## 2024-08-14 DIAGNOSIS — B9689 Other specified bacterial agents as the cause of diseases classified elsewhere: Secondary | ICD-10-CM

## 2024-08-14 DIAGNOSIS — F1490 Cocaine use, unspecified, uncomplicated: Secondary | ICD-10-CM | POA: Diagnosis not present

## 2024-08-14 DIAGNOSIS — B9562 Methicillin resistant Staphylococcus aureus infection as the cause of diseases classified elsewhere: Secondary | ICD-10-CM | POA: Diagnosis not present

## 2024-08-14 DIAGNOSIS — L089 Local infection of the skin and subcutaneous tissue, unspecified: Secondary | ICD-10-CM | POA: Diagnosis not present

## 2024-08-14 DIAGNOSIS — B9620 Unspecified Escherichia coli [E. coli] as the cause of diseases classified elsewhere: Secondary | ICD-10-CM

## 2024-08-14 DIAGNOSIS — Z22359 Carrier of enterobacterales, unspecified: Secondary | ICD-10-CM

## 2024-08-14 DIAGNOSIS — F1990 Other psychoactive substance use, unspecified, uncomplicated: Secondary | ICD-10-CM | POA: Diagnosis not present

## 2024-08-14 DIAGNOSIS — T148XXA Other injury of unspecified body region, initial encounter: Secondary | ICD-10-CM | POA: Diagnosis not present

## 2024-08-14 DIAGNOSIS — R7881 Bacteremia: Secondary | ICD-10-CM | POA: Diagnosis not present

## 2024-08-14 LAB — CULTURE, BLOOD (ROUTINE X 2)
Culture: NO GROWTH
Culture: NO GROWTH
Special Requests: ADEQUATE
Special Requests: ADEQUATE

## 2024-08-14 LAB — CREATININE, SERUM
Creatinine, Ser: 1.16 mg/dL (ref 0.61–1.24)
GFR, Estimated: >60 mL/min (ref >60)

## 2024-08-14 MED ORDER — POLYETHYLENE GLYCOL 3350 17 G PO PACK
17.0000 g | PACK | Freq: Every day | ORAL | Status: DC
  Filled 2024-08-14 (×4): qty 1

## 2024-08-14 NOTE — Consult Note (Addendum)
 Pharmacy Antibiotic Note  ASSESSMENT: 43 y.o. 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  Progressively worsening swelling with subjective chills/fevers. Wound infection previously grew E. Cloacae and MRSA. Patient had discharged with linezolid  and ciprofloxacin  from 04/2024 hospitalization. Pharmacy has been consulted to manage vancomycin  dosing.  Update 10/6: Now on Bactrim for FQ-resistant E.coli in wound culture ID team wants vancomycin  until at least 10/14, then plan to transition to dalbavancin Avoiding linezolid  in setting of fentanyl/methadone use  Monitoring: Vancomycin  levels Patient refused vancomycin  peak at 02:00 10/1 He did allow trough to be drawn 10/1 at 0947 = 18 mcg/mL (this was prior to 6th 1500mg  IV q12h dose) Previous dose given 9/30 at 21:57 Stable renal function  PLAN: Continue vancomycin  1500mg  IV q12H Consider weekly vancomycin  levels from 10/8 onward Await final plan for treatment from ID  Patient measurements: Height: 6' 1 (185.4 cm) Weight: 95.3 kg (210 lb) IBW/kg (Calculated) : 79.9  Vital signs: Temp: 97.6 F (36.4 C) (10/06 0733) BP: 126/97 (10/06 0733) Pulse Rate: 59 (10/06 0733) Recent Labs  Lab 08/08/24 1147 08/09/24 0947 08/12/24 0617 08/14/24 0812  WBC 7.9  --   --   --   CREATININE 1.06 1.04 1.04 1.16   Estimated Creatinine Clearance: 93.8 mL/min (by C-G formula based on SCr of 1.16 mg/dL).  Allergies: No Known Allergies  Antimicrobials this admission: Zosyn  9/28 x 1 Cefepime  9/28>>9/29 Vancomycin  9/28>> Metronidazole  9/28>>9/29 Ciprofloxacin  9/29>>10/4 Bactrim 10/4>>  Thank you for allowing pharmacy to be a part of this patient's care.  Will M. Lenon, PharmD, BCPS Clinical Pharmacist 08/14/2024 9:25 AM

## 2024-08-14 NOTE — Plan of Care (Signed)
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
## 2024-08-14 NOTE — Progress Notes (Signed)
   Date of Admission:  08/06/2024      ID: [name] is a 43 y.o. male  Principal Problem:   Wound infection Active Problems:   History of intravenous drug use in remission   History of MRSA infection   Polysubstance abuse (HCC)   IVDU (intravenous drug user)   MRSA bacteremia    Subjective: Doing better   Medications:   enoxaparin  (LOVENOX ) injection  40 mg Subcutaneous Q24H   methadone  120 mg Oral Daily   sodium chloride  flush  3 mL Intravenous Q12H   sulfamethoxazole-trimethoprim  1 tablet Oral Q12H    Objective: Vital signs in last 24 hours: Patient Vitals for the past 24 hrs:  BP Temp Temp src Pulse Resp SpO2  08/14/24 0733 (!) 126/97 97.6 F (36.4 C) -- (!) 59 16 100 %  08/14/24 0436 116/72 97.7 F (36.5 C) -- 71 18 99 %  08/13/24 1938 117/76 98.4 F (36.9 C) Oral 62 17 97 %  08/13/24 1556 116/84 98.4 F (36.9 C) -- 81 17 95 %     PHYSICAL EXAM:  Cheerful, awake and alert Chets b/l air entry [id number] Abd soft Legs - left leg wound much improved- clean, granulating today   On admission   Rt leg wound smaller  Lab Results    Latest Ref Rng & Units 08/08/2024   11:47 AM 08/07/2024    2:59 AM 08/06/2024   11:31 AM  CBC  WBC 4.0 - 10.5 K/uL 7.9  9.6  11.4   Hemoglobin 13.0 - 17.0 g/dL 89.1  89.9  89.7   Hematocrit 39.0 - 52.0 % 33.5  31.9  32.5   Platelets 150 - 400 K/uL 519  510  527        Latest Ref Rng & Units 08/14/2024    8:12 AM 08/12/2024    6:17 AM 08/09/2024    9:47 AM  CMP  Creatinine 0.61 - 1.24 mg/dL 8.83  8.95  8.95       Microbiology: Blood culture from 08/06/2024 1 set positive for MRSA 08/09/24 BC NG so far Wound culture MRSA, enterobacter and ecoli   Assessment/Plan: MRSA bacteremia, source left leg wound This is recurrent bacteremia Patient is currently on vancomycin  TEE neg Repeat blood culture neg 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 We will do 2 weeks IV vanco in house until 10/14 and after that can do  weekly dalba infusion in the day surgery X 2 doses after initial 2 weeks of daily IV He had been on linezolid  before when he left AMA Would avoid linezolid  with fentanyl /mehadone use if at all possible.     IVDA Polysubstance use including fentanyl and cocaine Cannot be sent home on PICC Patient states currently He is on methadone  HIV nonreactive June 2025 hep C also was nonreactive then   Discussed the management with the patient

## 2024-08-14 NOTE — Progress Notes (Addendum)
 Entered pt room to do his shift assessment, pt had 3 large white oval pills in medicine cups on his bedside table. Asked pt what was the name of the pills, he states they are vitamins or abx. Pt took one of the pills and I remove the other 2 pills out the room.   Informed pharmacy of meds found on bedside table and pt taking 1 pill at 2000. Pharmacy confirmed that the pills was Bactrim DS and instructed me to mark 1000am as not give and mark 2026 was given for 2200 med. Pharmacy informed me to make sure other nurses watch pt take medications in the future.

## 2024-08-15 DIAGNOSIS — T148XXA Other injury of unspecified body region, initial encounter: Secondary | ICD-10-CM | POA: Diagnosis not present

## 2024-08-15 DIAGNOSIS — L089 Local infection of the skin and subcutaneous tissue, unspecified: Secondary | ICD-10-CM | POA: Diagnosis not present

## 2024-08-15 LAB — CBC
HCT: 40.3 % (ref 39.0–52.0)
Hemoglobin: 12.7 g/dL — ABNORMAL LOW (ref 13.0–17.0)
MCH: 27.7 pg (ref 26.0–34.0)
MCHC: 31.5 g/dL (ref 30.0–36.0)
MCV: 87.8 fL (ref 80.0–100.0)
Platelets: 439 K/uL — ABNORMAL HIGH (ref 150–400)
RBC: 4.59 MIL/uL (ref 4.22–5.81)
RDW: 16.2 % — ABNORMAL HIGH (ref 11.5–15.5)
WBC: 7.6 K/uL (ref 4.0–10.5)
nRBC: 0 % (ref 0.0–0.2)

## 2024-08-15 LAB — VANCOMYCIN, TROUGH: Vancomycin Tr: 16 ug/mL (ref 15–20)

## 2024-08-15 LAB — COMPREHENSIVE METABOLIC PANEL WITH GFR
ALT: 8 U/L (ref 0–44)
AST: 16 U/L (ref 15–41)
Albumin: 3.8 g/dL (ref 3.5–5.0)
Alkaline Phosphatase: 72 U/L (ref 38–126)
Anion gap: 9 (ref 5–15)
BUN: 13 mg/dL (ref 6–20)
CO2: 24 mmol/L (ref 22–32)
Calcium: 9 mg/dL (ref 8.9–10.3)
Chloride: 103 mmol/L (ref 98–111)
Creatinine, Ser: 1.07 mg/dL (ref 0.61–1.24)
GFR, Estimated: >60 mL/min (ref >60)
Glucose, Bld: 77 mg/dL (ref 70–99)
Potassium: 4.1 mmol/L (ref 3.5–5.1)
Sodium: 136 mmol/L (ref 135–145)
Total Bilirubin: 0.3 mg/dL (ref 0.0–1.2)
Total Protein: 8.5 g/dL — ABNORMAL HIGH (ref 6.5–8.1)

## 2024-08-15 NOTE — Consult Note (Addendum)
 Pharmacy Antibiotic Note  ASSESSMENT: 43 y.o. 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  Progressively worsening swelling with subjective chills/fevers. Wound infection previously grew E. Cloacae and MRSA. Patient had discharged with linezolid  and ciprofloxacin  from 04/2024 hospitalization. Pharmacy has been consulted to manage vancomycin  dosing.  Today, 08/15/2024 Day #10 antibiotics - currently vancomycin  and TMP/SMZ (changed from ciprofloxacin  on 10/4 when E coli susceptibilities reported as resistant to ciprofloxacin ) Renal: SCr stable 1.07 WBC WNL Afebrile 9/28 Blood cx: 3/4 GPC, MRSA 10/1 repeat Blood rk:[facility] 9/30 leg wound cx: MRSA, E cloacae, E. Coli  (non-susc to amp, amp/sulb, cefazolin, ciprofloxacin ) Previous wound cultures with E cloacae (one culture organism resistant to cefepime , susceptible to cefepime  on a 2nd culture) 10/1 TEE was negative for endocarditis  Vancomycin  levels Patient refuse vancomycin  peak at 02:00 10/1 He did allow trough to be drawn 10/1 at 0947 = 18 mcg/mL (this was prior to 6th 1500mg  IV q12h dose) Previous dose given 9/30 at 2:157 10/7 vancomycin  trough on 1500mg  IV q12h at 10:24 = 16 mcg/mL (previous dose given at 22:01 10/6)   PLAN: Based on vancomycin  trough from 10/7, continue vancomycin  1500mg  IV q12h Note - no AUC dosing calculated as patient refused peak so only checking troughs to minimize lab draws Goal Vancomycin  trough 15-20 mcg/mL) Follow renal function Await final plan for treatment  Patient measurements: Height: 6' 1 (185.4 cm) Weight: 95.3 kg (210 lb) IBW/kg (Calculated) : 79.9  Vital signs: Temp: 98.2 F (36.8 C) (10/07 0757) Temp Source: Oral (10/07 0757) BP: 136/94 (10/07 0757) Pulse Rate: 61 (10/07 0757) Recent Labs  Lab 08/08/24 1147 08/09/24 0947 08/12/24 0617 08/14/24 0812 08/15/24 1024   WBC 7.9  --   --   --  7.6  CREATININE 1.06   < > 1.04 1.16 1.07   < > = values in this interval not displayed.   Estimated Creatinine Clearance: 101.6 mL/min (by C-G formula based on SCr of 1.07 mg/dL).  Allergies: No Known Allergies  Antimicrobials this admission: Zosyn  9/28 x 1 Cefepime  9/28 >>9/29 Vancomycin  9/28 >> Metronidazole  9/28 >>9/29 Ciprofloxacin  9/29 >>10/4 TMP/SMZ 10/4 >>   Thank you for allowing pharmacy to be a part of this patient's care.  Lissa Rowles, PharmD, BCPS, BCIDP Work Cell: [phone] 08/15/2024 11:06 AM
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
## 2024-08-15 NOTE — Progress Notes (Addendum)
 0020 Pt came out in hallway, wanting his IV disconnected so he can go warm up his food.I informed pt that contact isolation does not suppose to leave their room due to the spread of the infection. Also, informed him that pt cannot leave their room without an order. He has no order. He states he has been going to cafe and to vending/drink machine room to warm his food for a week and no one has said anything until now. He also stated that Dr. Leesa said he can go down. I informed him of the policy for contact isolation and spreading infection he said I should not have came to work then. I informed him I follow the precautions policy but other not knowing or if he touch things without washing his hands could affect others. Nehkai witness our conversations. Pt disconnected his IV, grabbed his bag with his food and left off the unit. [facility] was informed about this matter.

## 2024-08-15 NOTE — Plan of Care (Signed)

## 2024-08-15 NOTE — Progress Notes (Signed)
 [name] has been wanting to go off unit to get food and/or use the microwave. DO [name], Infection and Disease, and management came to the agreement that pt could walk on the unit as long as his wounds have had the daily dressing change and wound care as ordered and are covered. He must wear clean clothes and do hand hygiene. I relayed this education to the pt. Pt verbalized understanding, though he stated that if he got too claustrophobic, he would leave.

## 2024-08-15 NOTE — Progress Notes (Signed)
 Spoke with [facility] Attorney's Office regarding patient being hospitalized and unable to attend scheduled court date. [employer] stated they were unable to accept medical documentation to prevent failure to appear. They did make a note in the court file. Patient made aware.

## 2024-08-16 DIAGNOSIS — T148XXA Other injury of unspecified body region, initial encounter: Secondary | ICD-10-CM | POA: Diagnosis not present

## 2024-08-16 DIAGNOSIS — L089 Local infection of the skin and subcutaneous tissue, unspecified: Secondary | ICD-10-CM | POA: Diagnosis not present

## 2024-08-16 LAB — MINIMUM INHIBITORY CONC. (1 DRUG)

## 2024-08-16 LAB — MIC RESULT

## 2024-08-16 NOTE — Progress Notes (Signed)
 Patient refused dressing change per previous nurse Albino, RN.

## 2024-08-16 NOTE — Plan of Care (Signed)

## 2024-08-16 NOTE — Plan of Care (Signed)
  Problem: Education: Goal: Knowledge of General Education information will improve Description: Including pain rating scale, medication(s)/side effects and non-pharmacologic comfort measures Outcome: Progressing   Problem: Clinical Measurements: Goal: Diagnostic test results will improve Outcome: Progressing   Problem: Coping: Goal: Level of anxiety will decrease Outcome: Progressing   Problem: Safety: Goal: Ability to remain free from injury will improve Outcome: Progressing   
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
## 2024-08-17 DIAGNOSIS — L089 Local infection of the skin and subcutaneous tissue, unspecified: Secondary | ICD-10-CM | POA: Diagnosis not present

## 2024-08-17 DIAGNOSIS — T148XXA Other injury of unspecified body region, initial encounter: Secondary | ICD-10-CM | POA: Diagnosis not present

## 2024-08-17 LAB — CBC WITH DIFFERENTIAL/PLATELET
Abs Immature Granulocytes: 0.04 K/uL (ref 0.00–0.07)
Basophils Absolute: 0.1 K/uL (ref 0.0–0.1)
Basophils Relative: 1 %
Eosinophils Absolute: 0.2 K/uL (ref 0.0–0.5)
Eosinophils Relative: 3 %
HCT: 34.1 % — ABNORMAL LOW (ref 39.0–52.0)
Hemoglobin: 10.9 g/dL — ABNORMAL LOW (ref 13.0–17.0)
Immature Granulocytes: 1 %
Lymphocytes Relative: 34 %
Lymphs Abs: 2.7 K/uL (ref 0.7–4.0)
MCH: 27.8 pg (ref 26.0–34.0)
MCHC: 32 g/dL (ref 30.0–36.0)
MCV: 87 fL (ref 80.0–100.0)
Monocytes Absolute: 0.8 K/uL (ref 0.1–1.0)
Monocytes Relative: 11 %
Neutro Abs: 4 K/uL (ref 1.7–7.7)
Neutrophils Relative %: 50 %
Platelets: 382 K/uL (ref 150–400)
RBC: 3.92 MIL/uL — ABNORMAL LOW (ref 4.22–5.81)
RDW: 16.3 % — ABNORMAL HIGH (ref 11.5–15.5)
Smear Review: NORMAL
WBC: 7.8 K/uL (ref 4.0–10.5)
nRBC: 0 % (ref 0.0–0.2)

## 2024-08-17 LAB — CULTURE, BLOOD (ROUTINE X 2): Special Requests: ADEQUATE

## 2024-08-17 LAB — BASIC METABOLIC PANEL WITH GFR
Anion gap: 7 (ref 5–15)
BUN: 11 mg/dL (ref 6–20)
CO2: 24 mmol/L (ref 22–32)
Calcium: 8.8 mg/dL — ABNORMAL LOW (ref 8.9–10.3)
Chloride: 106 mmol/L (ref 98–111)
Creatinine, Ser: 1.12 mg/dL (ref 0.61–1.24)
GFR, Estimated: >60 mL/min (ref >60)
Glucose, Bld: 92 mg/dL (ref 70–99)
Potassium: 4.5 mmol/L (ref 3.5–5.1)
Sodium: 137 mmol/L (ref 135–145)

## 2024-08-17 NOTE — Plan of Care (Signed)
  Problem: Education: Goal: Knowledge of General Education information will improve Description: Including pain rating scale, medication(s)/side effects and non-pharmacologic comfort measures 08/17/2024 1923 by Sandralee Corean BROCKS, RN Outcome: Progressing 08/17/2024 1923 by Sandralee Corean BROCKS, RN Outcome: Progressing 08/17/2024 1922 by Sandralee Corean BROCKS, RN Outcome: Progressing   Problem: Health Behavior/Discharge Planning: Goal: Ability to manage health-related needs will improve 08/17/2024 1923 by Sandralee Corean BROCKS, RN Outcome: Progressing 08/17/2024 1923 by Sandralee Corean BROCKS, RN Outcome: Progressing 08/17/2024 1922 by Sandralee Corean BROCKS, RN Outcome: Progressing   Problem: Clinical Measurements: Goal: Will remain free from infection Outcome: Progressing

## 2024-08-17 NOTE — Plan of Care (Signed)
  Problem: Education: Goal: Knowledge of General Education information will improve Description: Including pain rating scale, medication(s)/side effects and non-pharmacologic comfort measures 08/17/2024 1923 by Sandralee Corean BROCKS, RN Outcome: Progressing 08/17/2024 1922 by Sandralee Corean BROCKS, RN Outcome: Progressing   Problem: Health Behavior/Discharge Planning: Goal: Ability to manage health-related needs will improve 08/17/2024 1923 by Sandralee Corean BROCKS, RN Outcome: Progressing 08/17/2024 1922 by Sandralee Corean BROCKS, RN Outcome: Progressing
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
## 2024-08-17 NOTE — Plan of Care (Signed)
   Problem: Education: Goal: Knowledge of General Education information will improve Description Including pain rating scale, medication(s)/side effects and non-pharmacologic comfort measures Outcome: Progressing   Problem: Health Behavior/Discharge Planning: Goal: Ability to manage health-related needs will improve Outcome: Progressing

## 2024-08-18 DIAGNOSIS — T148XXA Other injury of unspecified body region, initial encounter: Secondary | ICD-10-CM | POA: Diagnosis not present

## 2024-08-18 DIAGNOSIS — R7881 Bacteremia: Secondary | ICD-10-CM | POA: Diagnosis not present

## 2024-08-18 DIAGNOSIS — L089 Local infection of the skin and subcutaneous tissue, unspecified: Secondary | ICD-10-CM | POA: Diagnosis not present

## 2024-08-18 DIAGNOSIS — F1490 Cocaine use, unspecified, uncomplicated: Secondary | ICD-10-CM | POA: Diagnosis not present

## 2024-08-18 DIAGNOSIS — F1990 Other psychoactive substance use, unspecified, uncomplicated: Secondary | ICD-10-CM | POA: Diagnosis not present

## 2024-08-18 DIAGNOSIS — B9562 Methicillin resistant Staphylococcus aureus infection as the cause of diseases classified elsewhere: Secondary | ICD-10-CM | POA: Diagnosis not present

## 2024-08-18 NOTE — Progress Notes (Signed)
 Date of Admission:  08/06/2024      ID: [name] is a 43 y.o. male  Principal Problem:   Wound infection Active Problems:   History of intravenous drug use in remission   History of MRSA infection   Polysubstance abuse (HCC)   IVDU (intravenous drug user)   MRSA bacteremia    Subjective: Doing fine In bed with shades down   Medications:   enoxaparin  (LOVENOX ) injection  40 mg Subcutaneous Q24H   methadone  120 mg Oral Daily   polyethylene glycol  17 g Oral Daily   sodium chloride  flush  3 mL Intravenous Q12H   sulfamethoxazole-trimethoprim  1 tablet Oral Q12H    Objective: Vital signs in last 24 hours: Patient Vitals for the past 24 hrs:  BP Temp Temp src Pulse Resp SpO2  08/18/24 0727 108/69 97.7 F (36.5 C) -- 71 17 100 %  08/18/24 0528 101/63 98.7 F (37.1 C) Oral 78 17 99 %  08/17/24 1923 104/69 98.7 F (37.1 C) -- 74 17 100 %  08/17/24 1533 (!) 103/54 98.2 F (36.8 C) Oral 74 20 92 %     PHYSICAL EXAM:  No complaintst Chest b/l air entry [id number] Abd soft Legs - left leg wound much improved- clean, granulating today   On admission   Rt leg wound smaller  Lab Results    Latest Ref Rng & Units 08/17/2024    9:21 AM 08/15/2024   10:24 AM 08/08/2024   11:47 AM  CBC  WBC 4.0 - 10.5 K/uL 7.8  7.6  7.9   Hemoglobin 13.0 - 17.0 g/dL 89.0  87.2  89.1   Hematocrit 39.0 - 52.0 % 34.1  40.3  33.5   Platelets 150 - 400 K/uL 382  439  519        Latest Ref Rng & Units 08/17/2024    9:21 AM 08/15/2024   10:24 AM 08/14/2024    8:12 AM  CMP  Glucose 70 - 99 mg/dL 92  77    BUN 6 - 20 mg/dL 11  13    Creatinine 9.38 - 1.24 mg/dL 8.87  8.92  8.83   Sodium 135 - 145 mmol/L 137  136    Potassium 3.5 - 5.1 mmol/L 4.5  4.1    Chloride 98 - 111 mmol/L 106  103    CO2 22 - 32 mmol/L 24  24    Calcium  8.9 - 10.3 mg/dL 8.8  9.0    Total Protein 6.5 - 8.1 g/dL  8.5    Total Bilirubin 0.0 - 1.2 mg/dL  0.3    Alkaline Phos 38 - 126 U/L  72    AST 15 - 41 U/L   16    ALT 0 - 44 U/L  8        Microbiology: Blood culture from 08/06/2024 1 set positive for MRSA 08/09/24 BC NG so far Wound culture MRSA, enterobacter and ecoli   Assessment/Plan: MRSA bacteremia, source left leg wound This is recurrent bacteremia Patient is currently on vancomycin  TEE neg Repeat blood culture neg Skin and soft tissue infection with chronic leg wounds due to MRSA Patient has had skin popping in the past but this looks like fentanyl laced with xylazine wounds Culture MRSA, enterobacter and ecoli  Patient is currently on vanco and  bactrim Cr 1.16  He will complete 14 days of Rx on Monday- Will assess the wound and if improved can do Oritavancin long acting antibiotic which  will last a week- HE can be DC after that on o linezolid  which would start on 10/21 Bactrim can be stopped early next week   IVDA Polysubstance use including fentanyl and cocaine Cannot be sent home on PICC Patient states currently He is on methadone  HIV nonreactive June 2025 hep C also was nonreactive then   Discussed the management with the patient  ID will not see him this weekend Call if needed
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
## 2024-08-19 DIAGNOSIS — L089 Local infection of the skin and subcutaneous tissue, unspecified: Secondary | ICD-10-CM | POA: Diagnosis not present

## 2024-08-19 DIAGNOSIS — T148XXA Other injury of unspecified body region, initial encounter: Secondary | ICD-10-CM | POA: Diagnosis not present

## 2024-08-19 NOTE — Plan of Care (Signed)
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
## 2024-08-19 NOTE — Progress Notes (Signed)
Patient refused AM labs. MD notified.

## 2024-08-20 NOTE — Progress Notes (Addendum)
 I was informed that pt trow pizza box out of his room. According to NT, pt got upset because nursing staff refused to reheat his pizza. [name] is on contact precaution, and according to our policy we are not able to take food out of pt room due to infection prevention.  I asked pt to come back and talk about why he was upset, but he refused. He refused to sign AMA form as well. Pt pooled his IV out and left it on the counter in his room.  [name], MD notified, and security made aware as well.  Pt took his personal belongings and walked out of the unit at 0128.  No signs of any distress.  Safety Zone Portal completed.

## 2024-09-07 LAB — ECHO TEE
